# Patient Record
Sex: Female | Born: 1940 | Race: White | Hispanic: No | Smoking: Former smoker
Health system: Southern US, Community
[De-identification: ages and names within clinical notes are randomized; demographics above are authoritative.]

## PROBLEM LIST (undated history)

## (undated) DIAGNOSIS — G35 Multiple sclerosis: Secondary | ICD-10-CM

## (undated) DIAGNOSIS — G4733 Obstructive sleep apnea (adult) (pediatric): Secondary | ICD-10-CM

## (undated) DIAGNOSIS — E785 Hyperlipidemia, unspecified: Secondary | ICD-10-CM

## (undated) DIAGNOSIS — F419 Anxiety disorder, unspecified: Secondary | ICD-10-CM

## (undated) DIAGNOSIS — Z87898 Personal history of other specified conditions: Secondary | ICD-10-CM

## (undated) DIAGNOSIS — I7121 Aneurysm of the ascending aorta, without rupture: Secondary | ICD-10-CM

## (undated) DIAGNOSIS — N319 Neuromuscular dysfunction of bladder, unspecified: Secondary | ICD-10-CM

## (undated) DIAGNOSIS — F32A Depression, unspecified: Secondary | ICD-10-CM

## (undated) DIAGNOSIS — M1990 Unspecified osteoarthritis, unspecified site: Secondary | ICD-10-CM

## (undated) DIAGNOSIS — F17200 Nicotine dependence, unspecified, uncomplicated: Secondary | ICD-10-CM

## (undated) DIAGNOSIS — E039 Hypothyroidism, unspecified: Secondary | ICD-10-CM

## (undated) DIAGNOSIS — G8929 Other chronic pain: Secondary | ICD-10-CM

## (undated) DIAGNOSIS — I639 Cerebral infarction, unspecified: Secondary | ICD-10-CM

## (undated) DIAGNOSIS — M545 Other chronic pain: Secondary | ICD-10-CM

## (undated) DIAGNOSIS — E119 Type 2 diabetes mellitus without complications: Secondary | ICD-10-CM

## (undated) DIAGNOSIS — E669 Obesity, unspecified: Secondary | ICD-10-CM

## (undated) DIAGNOSIS — W540XXA Bitten by dog, initial encounter: Secondary | ICD-10-CM

## (undated) DIAGNOSIS — I10 Essential (primary) hypertension: Secondary | ICD-10-CM

## (undated) DIAGNOSIS — K589 Irritable bowel syndrome without diarrhea: Secondary | ICD-10-CM

## (undated) DIAGNOSIS — I712 Thoracic aortic aneurysm, without rupture: Secondary | ICD-10-CM

## (undated) DIAGNOSIS — R32 Unspecified urinary incontinence: Secondary | ICD-10-CM

## (undated) DIAGNOSIS — F329 Major depressive disorder, single episode, unspecified: Secondary | ICD-10-CM

## (undated) HISTORY — DX: Cerebral infarction, unspecified: I63.9

## (undated) HISTORY — DX: Neuromuscular dysfunction of bladder, unspecified: N31.9

## (undated) HISTORY — DX: Thoracic aortic aneurysm, without rupture: I71.2

## (undated) HISTORY — DX: Multiple sclerosis: G35

## (undated) HISTORY — DX: Aneurysm of the ascending aorta, without rupture: I71.21

## (undated) HISTORY — DX: Other chronic pain: G89.29

## (undated) HISTORY — PX: CATARACT EXTRACTION: SUR2

## (undated) HISTORY — DX: Major depressive disorder, single episode, unspecified: F32.9

## (undated) HISTORY — DX: Essential (primary) hypertension: I10

## (undated) HISTORY — DX: Bitten by dog, initial encounter: W54.0XXA

## (undated) HISTORY — DX: Low back pain: M54.5

## (undated) HISTORY — DX: Nicotine dependence, unspecified, uncomplicated: F17.200

## (undated) HISTORY — PX: CHOLECYSTECTOMY: SHX55

## (undated) HISTORY — PX: BACK SURGERY: SHX140

## (undated) HISTORY — DX: Anxiety disorder, unspecified: F41.9

## (undated) HISTORY — DX: Other chronic pain: M54.50

## (undated) HISTORY — PX: RETINAL DETACHMENT SURGERY: SHX105

## (undated) HISTORY — DX: Obesity, unspecified: E66.9

## (undated) HISTORY — DX: Hypothyroidism, unspecified: E03.9

## (undated) HISTORY — DX: Obstructive sleep apnea (adult) (pediatric): G47.33

## (undated) HISTORY — PX: NASAL SINUS SURGERY: SHX719

## (undated) HISTORY — PX: OTHER SURGICAL HISTORY: SHX169

## (undated) HISTORY — DX: Depression, unspecified: F32.A

## (undated) HISTORY — DX: Hyperlipidemia, unspecified: E78.5

---

## 2004-06-24 HISTORY — PX: LEG SURGERY: SHX1003

## 2004-12-30 ENCOUNTER — Emergency Department (HOSPITAL_COMMUNITY): Admission: EM | Admit: 2004-12-30 | Discharge: 2004-12-30 | Payer: Self-pay | Admitting: Emergency Medicine

## 2005-08-14 ENCOUNTER — Ambulatory Visit (HOSPITAL_COMMUNITY): Admission: RE | Admit: 2005-08-14 | Discharge: 2005-08-14 | Payer: Self-pay | Admitting: Neurological Surgery

## 2006-07-22 ENCOUNTER — Ambulatory Visit (HOSPITAL_BASED_OUTPATIENT_CLINIC_OR_DEPARTMENT_OTHER): Admission: RE | Admit: 2006-07-22 | Discharge: 2006-07-22 | Payer: Self-pay | Admitting: Otolaryngology

## 2006-07-22 ENCOUNTER — Encounter: Payer: Self-pay | Admitting: Internal Medicine

## 2006-07-27 ENCOUNTER — Ambulatory Visit: Payer: Self-pay | Admitting: Internal Medicine

## 2006-10-16 ENCOUNTER — Ambulatory Visit: Payer: Self-pay | Admitting: Internal Medicine

## 2006-11-19 ENCOUNTER — Ambulatory Visit: Payer: Self-pay | Admitting: Internal Medicine

## 2006-12-01 ENCOUNTER — Ambulatory Visit: Payer: Self-pay | Admitting: Internal Medicine

## 2007-04-02 ENCOUNTER — Ambulatory Visit (HOSPITAL_COMMUNITY): Admission: RE | Admit: 2007-04-02 | Discharge: 2007-04-02 | Payer: Self-pay | Admitting: Orthopedic Surgery

## 2007-08-27 ENCOUNTER — Ambulatory Visit (HOSPITAL_BASED_OUTPATIENT_CLINIC_OR_DEPARTMENT_OTHER): Admission: RE | Admit: 2007-08-27 | Discharge: 2007-08-27 | Payer: Self-pay | Admitting: Urology

## 2007-11-30 ENCOUNTER — Encounter: Admission: RE | Admit: 2007-11-30 | Discharge: 2007-11-30 | Payer: Self-pay | Admitting: Neurological Surgery

## 2007-12-14 ENCOUNTER — Inpatient Hospital Stay (HOSPITAL_COMMUNITY): Admission: EM | Admit: 2007-12-14 | Discharge: 2007-12-18 | Payer: Self-pay | Admitting: Emergency Medicine

## 2007-12-16 ENCOUNTER — Ambulatory Visit: Payer: Self-pay | Admitting: Thoracic Surgery (Cardiothoracic Vascular Surgery)

## 2008-03-08 ENCOUNTER — Ambulatory Visit (HOSPITAL_BASED_OUTPATIENT_CLINIC_OR_DEPARTMENT_OTHER): Admission: RE | Admit: 2008-03-08 | Discharge: 2008-03-08 | Payer: Self-pay | Admitting: Urology

## 2008-05-02 ENCOUNTER — Ambulatory Visit: Payer: Self-pay | Admitting: *Deleted

## 2008-05-02 ENCOUNTER — Inpatient Hospital Stay (HOSPITAL_COMMUNITY): Admission: RE | Admit: 2008-05-02 | Discharge: 2008-05-07 | Payer: Self-pay | Admitting: Neurological Surgery

## 2008-05-12 ENCOUNTER — Inpatient Hospital Stay (HOSPITAL_COMMUNITY): Admission: AD | Admit: 2008-05-12 | Discharge: 2008-05-24 | Payer: Self-pay | Admitting: Neurological Surgery

## 2008-05-26 ENCOUNTER — Inpatient Hospital Stay (HOSPITAL_COMMUNITY): Admission: EM | Admit: 2008-05-26 | Discharge: 2008-05-30 | Payer: Self-pay | Admitting: Emergency Medicine

## 2008-09-05 ENCOUNTER — Ambulatory Visit: Payer: Self-pay | Admitting: Vascular Surgery

## 2010-05-02 ENCOUNTER — Encounter: Admission: RE | Admit: 2010-05-02 | Discharge: 2010-05-02 | Payer: Self-pay | Admitting: Neurological Surgery

## 2010-05-25 ENCOUNTER — Ambulatory Visit: Payer: Self-pay | Admitting: Internal Medicine

## 2010-05-25 DIAGNOSIS — G4733 Obstructive sleep apnea (adult) (pediatric): Secondary | ICD-10-CM

## 2010-05-25 DIAGNOSIS — IMO0002 Reserved for concepts with insufficient information to code with codable children: Secondary | ICD-10-CM

## 2010-05-25 DIAGNOSIS — G2581 Restless legs syndrome: Secondary | ICD-10-CM

## 2010-05-25 DIAGNOSIS — G4700 Insomnia, unspecified: Secondary | ICD-10-CM | POA: Insufficient documentation

## 2010-05-25 DIAGNOSIS — J42 Unspecified chronic bronchitis: Secondary | ICD-10-CM

## 2010-05-25 DIAGNOSIS — F17200 Nicotine dependence, unspecified, uncomplicated: Secondary | ICD-10-CM

## 2010-05-29 DIAGNOSIS — E785 Hyperlipidemia, unspecified: Secondary | ICD-10-CM

## 2010-05-29 DIAGNOSIS — I10 Essential (primary) hypertension: Secondary | ICD-10-CM | POA: Insufficient documentation

## 2010-05-29 DIAGNOSIS — J309 Allergic rhinitis, unspecified: Secondary | ICD-10-CM | POA: Insufficient documentation

## 2010-05-30 ENCOUNTER — Ambulatory Visit (HOSPITAL_COMMUNITY)
Admission: RE | Admit: 2010-05-30 | Discharge: 2010-05-30 | Payer: Self-pay | Source: Home / Self Care | Attending: Neurological Surgery | Admitting: Neurological Surgery

## 2010-05-30 LAB — CONVERTED CEMR LAB
BUN: 6 mg/dL (ref 6–23)
Basophils Relative: 1 % (ref 0.0–3.0)
CO2: 29 meq/L (ref 19–32)
Chloride: 100 meq/L (ref 96–112)
Creatinine, Ser: 1 mg/dL (ref 0.4–1.2)
Eosinophils Relative: 1.4 % (ref 0.0–5.0)
Glucose, Bld: 102 mg/dL — ABNORMAL HIGH (ref 70–99)
Hemoglobin: 16.9 g/dL — ABNORMAL HIGH (ref 12.0–15.0)
Lymphocytes Relative: 35.9 % (ref 12.0–46.0)
Neutro Abs: 7.2 10*3/uL (ref 1.4–7.7)
Neutrophils Relative %: 56 % (ref 43.0–77.0)
Potassium: 4.3 meq/L (ref 3.5–5.1)
RBC: 5.4 M/uL — ABNORMAL HIGH (ref 3.87–5.11)
WBC: 12.8 10*3/uL — ABNORMAL HIGH (ref 4.5–10.5)

## 2010-06-06 ENCOUNTER — Ambulatory Visit: Payer: Self-pay | Admitting: Internal Medicine

## 2010-06-06 ENCOUNTER — Telehealth: Payer: Self-pay | Admitting: Internal Medicine

## 2010-06-06 ENCOUNTER — Encounter: Payer: Self-pay | Admitting: Internal Medicine

## 2010-06-08 ENCOUNTER — Telehealth (INDEPENDENT_AMBULATORY_CARE_PROVIDER_SITE_OTHER): Payer: Self-pay | Admitting: *Deleted

## 2010-07-02 ENCOUNTER — Ambulatory Visit
Admission: RE | Admit: 2010-07-02 | Discharge: 2010-07-02 | Payer: Self-pay | Source: Home / Self Care | Attending: Internal Medicine | Admitting: Internal Medicine

## 2010-07-02 DIAGNOSIS — K219 Gastro-esophageal reflux disease without esophagitis: Secondary | ICD-10-CM | POA: Insufficient documentation

## 2010-07-24 NOTE — Assessment & Plan Note (Signed)
Summary: sleep apnea/ mbw   Primary Provider/Referring Provider:  [facility]  CC:  Former pt-sleep apnea..  History of Present Illness: 10/16/06- 1. Obstructive sleep apnea with insomnia. 2. Periodic limb movement with arousal. 3. Bronchitis.  HISTORY:  At last visit, we had set up CPAP titration which indicated a best pressure of 13 CWP. She has not taken her machine in yet to get that pressure adjustment made, and I discussed that with her. She is noticing persistent cough and thick clear sputum, but says that it does not bother her. She is former smoker and she is on Lisinopril which we discussed.   May 25, 2010- OSA. PLMS, bronchitis  70 yoF dx'd 07/22/06 w/ moderate obstructive sleep apnea, AHI 25.2/hr, and periodic limb movement syndrome. She returns now complaining of difficulty initiating and maintaining sleep. Insomnia worse in past year, with no sleep on some nights. Clonazepam 1 mg helped for about 6 months, but is losing effect. Says she uses CPAP every night at 13, but sometimes "aggravating". Bedtime very irregular, with latency just a few minutes once ready for bed. Denies waking after sleep onset. Wake up is variable. Leggs are restelss, blamed on degenerative disk disease for which she rarely takes Vicodin.  Chronic bronchitis with persistent cough, "foam". Seasonal allergic rhinitis worse this year- taking daily Allegra and occasional Nasonex.    Preventive Screening-Counseling & Management  Alcohol-Tobacco     Smoking Status: current     Smoking Cessation Counseling: yes     Packs/Day: 0.5     Year Started: 1981     Tobacco Counseling: to quit use of tobacco products  Current Medications (verified): 1)  Klonopin 1 Mg Tabs (Clonazepam) .... Take 1-2 By Mouth At Bedtime 2)  Bystolic 10 Mg Tabs (Nebivolol Hcl) .... Take 1 By Mouth Once Daily 3)  Synthroid 175 Mcg Tabs (Levothyroxine Sodium) .... Take 1 By Mouth Once Daily 4)  Vicodin 5-500 Mg Tabs  (Hydrocodone-Acetaminophen) .... Take 1 By Mouth Every 4-6 Hours As Needed Pain 5)  Creon 24000 Unit Cpep (Pancrelipase (Lip-Prot-Amyl)) .... Take 1 By Mouth Once Daily  Allergies (verified): 1)  ! Codeine 2)  ! Pcn  Past History:  Family History: Last updated: 05/25/2010 Family hx of cancer(unsure what types) Father- died cancer and emphysema Mother living  Social History: Last updated: 05/25/2010 Widowed, Divorced Current smoker 1/2ppd Rare use of ETOH Retired Engineer, civil (consulting), Advertising account planner  Risk Factors: Smoking Status: current (05/25/2010) Packs/Day: 0.5 (05/25/2010)  Past Medical History: Obstructive sleep apnea- 07/22/06- NPSG AHI 25.2/ hr Chronic bronchitis Tobacco use Allergic rhinitis Degenerative disk disease Hyperlipidemia Hypertension  Past Surgical History: Lumbar disk surgery x 2- Dr Danielle Dess Appendectomy Cholecystectomy Total Abdominal Hysterectomy  Family History: Family hx of cancer(unsure what types) Father- died cancer and emphysema Mother living  Social History: Widowed, Divorced Current smoker 1/2ppd Rare use of ETOH Retired Engineer, civil (consulting), Estate agent Status:  current Packs/Day:  0.5  Vital Signs:  Patient profile:   70 year old female Weight:      249 pounds O2 Sat:      93 % on Room air Pulse rate:   67 / minute BP sitting:   124 / 62  (left arm) Cuff size:   large  Vitals Entered By: Reynaldo Minium CMA (May 25, 2010 2:41 PM)  O2 Flow:  Room air CC: Former pt-sleep apnea.   Physical Exam  Additional Exam:  General: A/Ox3; pleasant and cooperative, NAD, overweight SKIN: no rash, lesions NODES:  no lymphadenopathy HEENT: [place]/AT, EOM- WNL, Conjuctivae- clear, PERRLA, TM-WNL, Nose- clear, Throat- clear and wnl. Mallampati  IV NECK: Supple w/ fair ROM, JVD- none, normal carotid impulses w/o bruits Thyroid- normal to palpation CHEST: Coarse rhonchi bilaterally, unlabored HEART: RRR, no m/g/r heard ABDOMEN: Soft and nl; nml bowel  sounds; no organomegaly or masses noted HYQ:MVHQ, nl pulses, no edema  NEURO: Grossly intact to observation       Impression & Recommendations:  Problem # 1:  INSOMNIA (ICD-780.52) She has developed some tolerance to clonazepam and we will try increasing the dose while adding requip. Back pain and leg movement are contributing. We have reviewed sleep hygiene. I am not sure if her sleep apnea or CPAP are really part of the insomnia complaint now.   Problem # 2:  BRONCHITIS, CHRONIC (ICD-491.9)  Smoking cessation CXR Lab- BNP, IgE profile , PFT Neb depo  Problem # 3:  RESTLESS LEGS SYNDROME (ICD-333.94)  Increase clonazepam to 1.5 mg hs and add Requip  Problem # 4:  ALLERGIC RHINITIS (ICD-477.9) We will explore allergy contribution to her rhinitis and bronchits,while emphasizing that her smoking is important and maybe the dominant problem.  Medications Added to Medication List This Visit: 1)  Klonopin 1 Mg Tabs (Clonazepam) .... Take 1-2 by mouth at bedtime 2)  Bystolic 10 Mg Tabs (Nebivolol hcl) .... Take 1 by mouth once daily 3)  Synthroid 175 Mcg Tabs (Levothyroxine sodium) .... Take 1 by mouth once daily 4)  Vicodin 5-500 Mg Tabs (Hydrocodone-acetaminophen) .... Take 1 by mouth every 4-6 hours as needed pain 5)  Creon 24000 Unit Cpep (Pancrelipase (lip-prot-amyl)) .... Take 1 by mouth once daily 6)  Klonopin 1 Mg Tabs (Clonazepam) .... 1.5 mg ( 1 and a half tabs) at bedtime 7)  Ropinirole Hcl 0.25 Mg Tabs (Ropinirole hcl) .Marland Kitchen.. 1 at bedtime and up to three times a day as needed  Other Orders: New Patient Level IV (46962) TLB-CBC Platelet - w/Differential (85025-CBCD) TLB-BMP (Basic Metabolic Panel-BMET) (80048-METABOL) TLB-BNP (B-Natriuretic Peptide) (83880-BNPR) T-Allergy Profile Region II-[place], [place], [place], [place], [place] (5484) T-2 View CXR (71020TC) Misc. Referral (Misc. Ref)  Patient Instructions: 1)  Please schedule a follow-up appointment in 1 month. 2)  Let's please try to  stop smoking 3)  A chest x-ray has been recommended.  Your imaging study may require preauthorization.  4)  See [facility] to schedule PFT 5)  Try increasing Klonopin to one and a half tabs (1.5 mg) at bedtime. 6)  Add sample/ script ropinerole/ Requip 0.25 mg- 1 at bedtime and also up to 3 times daily if needed for leg jerks 7)  use the CPAP Prescriptions: ROPINIROLE HCL 0.25 MG TABS (ROPINIROLE HCL) 1 at bedtime and up to three times a day as needed  #50 x 3   Entered and Authorized by:   Waymon Budge MD   Signed by:   Waymon Budge MD on 05/25/2010   Method used:   Print then Give to Patient   RxID:   [id number]

## 2010-07-26 NOTE — Progress Notes (Signed)
Summary: returning call  Phone Note Call from Patient Call back at Home Phone [phone]   Caller: Patient Call For: young Summary of Call: Returning call. Initial call taken by: Darletta Moll,  June 08, 2010 11:28 AM  Follow-up for Phone Call        Rx was sent to pharm per last phone note- spoke with pt and notified this was done. Follow-up by: Vernie Murders,  June 08, 2010 1:28 PM    New/Updated Medications: BIAXIN 500 MG TABS (CLARITHROMYCIN) 1 by mouth two times a day until gone Prescriptions: BIAXIN 500 MG TABS (CLARITHROMYCIN) 1 by mouth two times a day until gone  #14 x 0   Entered by:   Vernie Murders   Authorized by:   Waymon Budge MD   Signed by:   Vernie Murders on 06/08/2010   Method used:   Electronically to        [facility]  [address] [phone]* (retail)       [address]       [place], [place]  [address]       Ph: [phone] or [phone]       Fax: [phone]   RxID:   [id number]

## 2010-07-26 NOTE — Assessment & Plan Note (Signed)
Summary: 1 MONTH RETURN/[facility]   Primary Provider/Referring Provider:  [facility]  CC:   1 month follow up, pt not taking ropinrole states she can not afford it , and prod cough yellow.  History of Present Illness: May 25, 2010- OSA. PLMS, bronchitis  70 yoF dx'd 07/22/06 w/ moderate obstructive sleep apnea, AHI 25.2/hr, and periodic limb movement syndrome. She returns now complaining of difficulty initiating and maintaining sleep. Insomnia worse in past year, with no sleep on some nights. Clonazepam 1 mg helped for about 6 months, but is losing effect. Says she uses CPAP every night at 13, but sometimes "aggravating". Bedtime very irregular, with latency just a few minutes once ready for bed. Denies waking after sleep onset. Wake up is variable. Leggs are restelss, blamed on degenerative disk disease for which she rarely takes Vicodin.  Chronic bronchitis with persistent cough, "foam". Seasonal allergic rhinitis worse this year- taking daily Allegra and occasional Nasonex.   July 02, 2010-  OSA. PLMS, bronchitis Nurse-CC:   1 month follow up, pt not taking ropinrole states she can not afford it , prod cough yellow Gave biaxin in December for bronchitis. Itr helped but still coughing productively yellow. This is her baseline over the past year. Denies fever. Nightsweats ? hormonal. Some days cough better than others and may sneeze, butno recent acute illness. Denies reflux recently but has had many times in past where she woke choking and gasping- suggestive of reflux with aspiration. Eval in past by  GI in [place], given pancreatic supplements. She saw no benefit from script antacids.  Continues CPAP every night. Ropinerole too expensive- didn't fill. Takes clonazepam 1-2 a night. Labs-PFT 06/06/10- mild obstruction small airways w/ resp to BD, R 0.80, DLCO 70%          CXR- mild thickening, NAD          CBC- WBC 12,800; Hgb 16.9- polycythemia vs dehydration           Allergy  profile- Neg, IgE < 1,5    Preventive Screening-Counseling & Management  Alcohol-Tobacco     Smoking Status: current     Smoking Cessation Counseling: yes     Packs/Day: 0.5     Year Started: 1981     Tobacco Counseling: to quit use of tobacco products  Current Medications (verified): 1)  Klonopin 1 Mg Tabs (Clonazepam) .... Take 1-2 By Mouth At Bedtime 2)  Bystolic 10 Mg Tabs (Nebivolol Hcl) .... Take 1 By Mouth Once Daily 3)  Synthroid 175 Mcg Tabs (Levothyroxine Sodium) .... Take 1 By Mouth Once Daily 4)  Vicodin 5-500 Mg Tabs (Hydrocodone-Acetaminophen) .... Take 1 By Mouth Every 4-6 Hours As Needed Pain 5)  Creon 24000 Unit Cpep (Pancrelipase (Lip-Prot-Amyl)) .... Take 1 By Mouth Once Daily 6)  Ropinirole Hcl 0.25 Mg Tabs (Ropinirole Hcl) .Marland Kitchen.. 1 At Bedtime and Up To Three Times A Day As Needed 7)  Paxil 20 Mg Tabs (Paroxetine Hcl) .Marland Kitchen.. 1 Two Times A Day 8)  Accupril 20 Mg Tabs (Quinapril Hcl) .Marland Kitchen.. 1 Once Daily 9)  Crestor 10 Mg Tabs (Rosuvastatin Calcium) .Marland Kitchen.. 1 Once Daily  Allergies (verified): 1)  ! Codeine 2)  ! Pcn  Past History:  Past Surgical History: Last updated: 05/25/2010 Lumbar disk surgery x 2- Dr Danielle Dess Appendectomy Cholecystectomy Total Abdominal Hysterectomy  Family History: Last updated: 05/25/2010 Family hx of cancer(unsure what types) Father- died cancer and emphysema Mother living  Social History: Last updated: 05/25/2010 Widowed, Divorced Current  smoker 1/2ppd Rare use of ETOH Retired Engineer, civil (consulting), Advertising account planner  Risk Factors: Smoking Status: current (07/02/2010) Packs/Day: 0.5 (07/02/2010)  Past Medical History: Obstructive sleep apnea- 07/22/06- NPSG AHI 25.2/ hr Chronic bronchitis- PFT 06/06/10- Mild obst small airways w/ resp to BD, R 0.80; DLCO 0.70 Tobacco use Allergic rhinitis Degenerative disk disease Hyperlipidemia Hypertension  Review of Systems      See HPI       The patient complains of shortness of breath with activity  and productive cough.  The patient denies shortness of breath at rest, coughing up blood, chest pain, irregular heartbeats, acid heartburn, indigestion, loss of appetite, weight change, abdominal pain, difficulty swallowing, sore throat, tooth/dental problems, headaches, nasal congestion/difficulty breathing through nose, and sneezing.    Vital Signs:  Patient profile:   70 year old female Height:      63 inches Weight:      246.2 pounds BMI:     43.77 O2 Sat:      90 % on Room air Pulse rate:   92 / minute BP sitting:   124 / 78  (right arm) Cuff size:   large  Vitals Entered By: Renold Genta RCP, LPN (July 02, 2010 11:00 AM)  O2 Flow:  Room air CC:  1 month follow up, pt not taking ropinrole states she can not afford it , prod cough yellow Comments Medications reviewed with patient Renold Genta RCP, LPN  July 02, 2010 11:00 AM    Physical Exam  Additional Exam:  General: A/Ox3; pleasant and cooperative, NAD, overweight SKIN: no rash, lesions NODES: no lymphadenopathy HEENT: [place]/AT, EOM- WNL, Conjuctivae- clear, PERRLA, TM-WNL, Nose- clear, Throat- clear and wnl. Mallampati  IV NECK: Supple w/ fair ROM, JVD- none, normal carotid impulses w/o bruits Thyroid- normal to palpation CHEST: Coarse rhonchi bilaterally, especially on the right, unlabored HEART: RRR, no m/g/r heard ABDOMEN: obese JXB:JYNW, nl pulses, no edema  NEURO: Grossly intact to observation       Impression & Recommendations:  Problem # 1:  BRONCHITIS, CHRONIC (ICD-491.9)  I can't show an obvious allergic process by in vitro testing. She may have caused this condition with repeated aspiration in the past, as well as smoking. Discussed trial of a LABA/ ICS, doxycycline  Problem # 2:  TOBACCO ABUSE (ICD-305.1)  Must stop smoking- try patches again. Support options reviewed.  Problem # 3:  RESTLESS LEGS SYNDROME (ICD-333.94)  Will try samples of Horizant since she is already on  clonazepam.  Problem # 4:  GERD (ICD-530.81)  Reflux precautions  Medications Added to Medication List This Visit: 1)  Paxil 20 Mg Tabs (Paroxetine hcl) .Marland Kitchen.. 1 two times a day 2)  Accupril 20 Mg Tabs (Quinapril hcl) .Marland Kitchen.. 1 once daily 3)  Crestor 10 Mg Tabs (Rosuvastatin calcium) .Marland Kitchen.. 1 once daily 4)  Doxycycline Hyclate 100 Mg Caps (Doxycycline hyclate) .... 2 today then one daily  Other Orders: Est. Patient Level IV (29562)  Patient Instructions: 1)  Please schedule a follow-up appointment in 1 month. 2)  Script doxycycline antibiotic 3)  Sample Dulera 200-5 for bronchitis 4)     2 puffs and rinse mouth well, twice every day 5)  Samples Horizant for leg jerks at night 6)     1 daily with supper  Prescriptions: DOXYCYCLINE HYCLATE 100 MG CAPS (DOXYCYCLINE HYCLATE) 2 today then one daily  #10 x 0   Entered and Authorized by:   Waymon Budge MD   Signed by:  Waymon Budge MD on 07/02/2010   Method used:   Print then Give to Patient   RxID:   [id number]

## 2010-07-26 NOTE — Miscellaneous (Signed)
Summary: Orders Update pft charges  Clinical Lists Changes  Orders: Added new Service order of Carbon Monoxide diffusing w/capacity (94720) - Signed Added new Service order of Lung Volumes (94240) - Signed Added new Service order of Spirometry (Pre & Post) (94060) - Signed 

## 2010-07-26 NOTE — Progress Notes (Signed)
Summary: Sick   Phone Note Call from Patient   Caller: Patient Call For: young Reason for Call: Acute Illness Summary of Call: Pt had PFT today and told Jerolyn Shin that she is having sinus congestion, pain, and pressure. She is having green colored drainage. please advise. Pt is aware of CDY out of the office until the morning. Initial call taken by: Reynaldo Minium CMA,  June 06, 2010 4:42 PM  Follow-up for Phone Call        Suggest offer her biaxin 500 mg , 14, 1 two times a day after meals Follow-up by: Waymon Budge MD,  June 06, 2010 9:34 PM     Appended Document: Sick  LMTCBx1.   Appended Document: Sick  LMTCBx2  Appended Document: Sick  Called, spoke with family memeber.  Was told pt still asleept.  LMTCB

## 2010-08-06 ENCOUNTER — Encounter: Payer: Self-pay | Admitting: Internal Medicine

## 2010-08-06 ENCOUNTER — Ambulatory Visit (INDEPENDENT_AMBULATORY_CARE_PROVIDER_SITE_OTHER): Payer: MEDICARE | Admitting: Internal Medicine

## 2010-08-06 DIAGNOSIS — J42 Unspecified chronic bronchitis: Secondary | ICD-10-CM

## 2010-08-06 DIAGNOSIS — F17200 Nicotine dependence, unspecified, uncomplicated: Secondary | ICD-10-CM

## 2010-08-06 DIAGNOSIS — G4733 Obstructive sleep apnea (adult) (pediatric): Secondary | ICD-10-CM

## 2010-08-15 NOTE — Assessment & Plan Note (Signed)
Summary: 1 month rov/kp   Primary Provider/Referring Provider:  [facility]  CC:  1 month follow up.  Pt states breathing is unchaged.  Prod cough with clear mucus. Some wheezing.  Denies chest tightness.  Still having problems with RLS - no relief with horizent..  History of Present Illness: July 02, 2010-  OSA. PLMS, bronchitis Nurse-CC:   1 month follow up, pt not taking ropinrole states she can not afford it , prod cough yellow Gave biaxin in December for bronchitis. Itr helped but still coughing productively yellow. This is her baseline over the past year. Denies fever. Nightsweats ? hormonal. Some days cough better than others and may sneeze, butno recent acute illness. Denies reflux recently but has had many times in past where she woke choking and gasping- suggestive of reflux with aspiration. Eval in past by  GI in [place], given pancreatic supplements. She saw no benefit from script antacids.  Continues CPAP every night. Ropinerole too expensive- didn't fill. Takes clonazepam 1-2 a night. Labs-PFT 06/06/10- mild obstruction small airways w/ resp to BD, R 0.80, DLCO 70%          CXR- mild thickening, NAD          CBC- WBC 12,800; Hgb 16.9- polycythemia vs dehydration           Allergy profile- Neg, IgE < 1.5  August 06, 2010-  OSA. PLMS, bronchitis, MS Nurse-CC: 1 month follow up.  Pt states breathing is unchanged.  Prod cough with clear mucus. Some wheezing.  Denies chest tightness.  Still having problems with RLS - no relief with horizent. OSA/ Restless Legs/ PLMS- Horizant didn't help. Couldn't afford ropinerole. She now says she thinks the problem is MS, which was a major problem with leg weakness 30 years ago. Her PCP is referring her to Neurology. She has not been iron deficient. Wears CPAP from [facility]- set on "4-5". Coughing spells cause her to take mask off and she gets upset with it. Sleep schedule is variable- poor hygiene and discomfort.  Bronchitis- Continues  to smoke. Note polycythemia. O2 sat 90%. Says she coughs a lot and always congested, but actually feels pretty good. Cough productive clear mucus. Says nothing makes a difference, including neb and depo.    Preventive Screening-Counseling & Management  Alcohol-Tobacco     Smoking Status: current     Smoking Cessation Counseling: yes     Smoke Cessation Stage: precontemplative     Packs/Day: 0.5     Year Started: 1981     Tobacco Counseling: to quit use of tobacco products  Current Medications (verified): 1)  Klonopin 1 Mg Tabs (Clonazepam) .... Take 1-2 By Mouth At Bedtime 2)  Bystolic 10 Mg Tabs (Nebivolol Hcl) .... Take 1 By Mouth Once Daily 3)  Synthroid 175 Mcg Tabs (Levothyroxine Sodium) .... Take 1 By Mouth Once Daily 4)  Vicodin 5-500 Mg Tabs (Hydrocodone-Acetaminophen) .... Take 1 By Mouth Every 4-6 Hours As Needed Pain 5)  Creon 24000 Unit Cpep (Pancrelipase (Lip-Prot-Amyl)) .... Take 1 Tablet By Mouth Three Times A Day 6)  Paxil 20 Mg Tabs (Paroxetine Hcl) .Marland Kitchen.. 1 Tablet in The Am and 2 Tablets in The Evening 7)  Accupril 20 Mg Tabs (Quinapril Hcl) .Marland Kitchen.. 1 Once Daily 8)  Crestor 10 Mg Tabs (Rosuvastatin Calcium) .Marland Kitchen.. 1 Once Daily  Allergies (verified): 1)  ! Codeine 2)  ! Pcn  Past History:  Past Medical History: Last updated: 07/02/2010 Obstructive sleep apnea- 07/22/06- NPSG AHI  25.2/ hr Chronic bronchitis- PFT 06/06/10- Mild obst small airways w/ resp to BD, R 0.80; DLCO 0.70 Tobacco use Allergic rhinitis Degenerative disk disease Hyperlipidemia Hypertension  Past Surgical History: Last updated: 05/25/2010 Lumbar disk surgery x 2- Dr Danielle Dess Appendectomy Cholecystectomy Total Abdominal Hysterectomy  Family History: Last updated: 05/25/2010 Family hx of cancer(unsure what types) Father- died cancer and emphysema Mother living  Social History: Last updated: 05/25/2010 Widowed, Divorced Current smoker 1/2ppd Rare use of ETOH Retired Engineer, civil (consulting), Arts development officer  Review of Systems      See HPI       The patient complains of dyspnea on exertion.  The patient denies anorexia, fever, weight loss, weight gain, vision loss, decreased hearing, hoarseness, chest pain, syncope, peripheral edema, prolonged cough, headaches, hemoptysis, abdominal pain, severe indigestion/heartburn, unusual weight change, abnormal bleeding, enlarged lymph nodes, and angioedema.    Vital Signs:  Patient profile:   70 year old female Height:      64 inches Weight:      248 pounds BMI:     42.72 O2 Sat:      90 % on Room air Pulse rate:   65 / minute BP sitting:   104 / 70  (left arm) Cuff size:   large  Vitals Entered By: Gweneth Dimitri RN (August 06, 2010 1:55 PM)  O2 Flow:  Room air CC: 1 month follow up.  Pt states breathing is unchaged.  Prod cough with clear mucus. Some wheezing.  Denies chest tightness.  Still having problems with RLS - no relief with horizent. Comments Medications reviewed with patient Daytime contact number verified with patient. Gweneth Dimitri RN  August 06, 2010 1:55 PM    Physical Exam  Additional Exam:  General: A/Ox3; pleasant and cooperative, NAD, overweight, ? depressed affect SKIN: no rash, lesions NODES: no lymphadenopathy HEENT: [place]/AT, EOM- WNL, Conjuctivae- clear, PERRLA, TM-WNL, Nose- clear, Throat- clear and wnl. Mallampati  IV NECK: Supple w/ fair ROM, JVD- none, normal carotid impulses w/o bruits Thyroid- normal to palpation CHEST: Coarse rhonchi bilaterally, especially on the right, unlabored. Steady loose cough. HEART: RRR, no m/g/r heard ABDOMEN: obese ZOX:WRUE, nl pulses, no edema, using cane NEURO: Grossly intact to observation       Impression & Recommendations:  Problem # 1:  BRONCHITIS, CHRONIC (ICD-491.9)  Chronic tobacco induced bronchitis. She is not making the effort to quit. I dn't think our meds will do much unless the cigarettes stop. I suspect she is depressed. We can try Spiriva to see if  anticholinergic drying is helpful.   Problem # 2:  TOBACCO ABUSE (ICD-305.1) I continue to press and encourage. She is not yet motivated to make this effort.   Problem # 3:  RESTLESS LEGS SYNDROME (ICD-333.94) I would give Sinemet, but if she is going soon to a neurologist about ? MS, then I will not muddy the water at this time.   Problem # 4:  OBSTRUCTIVE SLEEP APNEA (ICD-327.23)  Compliance with CPAP is limited by her coughing and may not change soon.   Medications Added to Medication List This Visit: 1)  Creon 24000 Unit Cpep (Pancrelipase (lip-prot-amyl)) .... Take 1 tablet by mouth three times a day 2)  Paxil 20 Mg Tabs (Paroxetine hcl) .Marland Kitchen.. 1 tablet in the am and 2 tablets in the evening 3)  Spiriva Handihaler 18 Mcg Caps (Tiotropium bromide monohydrate) .Marland Kitchen.. 1 daily  Other Orders: Est. Patient Level IV (45409)  Patient Instructions: 1)  Please schedule a follow-up appointment  in 3 months. 2)  I will be interested in what your Neurologist has to say. 3)  Cough is affecting your sleep and your ability to wear your CPAP. The best thing you can do to help the cough is to stop smoking.- Please try hard with this.  4)  Sample/ script Spiriva- 1 daily Prescriptions: SPIRIVA HANDIHALER 18 MCG CAPS (TIOTROPIUM BROMIDE MONOHYDRATE) 1 daily  #30 x prn   Entered and Authorized by:   Waymon Budge MD   Signed by:   Waymon Budge MD on 08/06/2010   Method used:   Print then Give to Patient   RxID:   [id number]    Immunization History:  Influenza Immunization History:    Influenza:  historical (05/24/2010)

## 2010-09-17 ENCOUNTER — Other Ambulatory Visit: Payer: Self-pay | Admitting: Neurology

## 2010-09-17 DIAGNOSIS — G373 Acute transverse myelitis in demyelinating disease of central nervous system: Secondary | ICD-10-CM

## 2010-09-18 ENCOUNTER — Ambulatory Visit
Admission: RE | Admit: 2010-09-18 | Discharge: 2010-09-18 | Disposition: A | Discharge status: Home / Self Care | Payer: MEDICARE | Source: Ambulatory Visit | Attending: Neurology | Admitting: Neurology

## 2010-09-18 DIAGNOSIS — G373 Acute transverse myelitis in demyelinating disease of central nervous system: Secondary | ICD-10-CM

## 2010-09-28 ENCOUNTER — Other Ambulatory Visit: Payer: Self-pay | Admitting: Neurology

## 2010-09-28 DIAGNOSIS — G373 Acute transverse myelitis in demyelinating disease of central nervous system: Secondary | ICD-10-CM

## 2010-10-04 ENCOUNTER — Ambulatory Visit
Admission: RE | Admit: 2010-10-04 | Discharge: 2010-10-04 | Disposition: A | Discharge status: Home / Self Care | Payer: MEDICARE | Source: Ambulatory Visit | Attending: Neurology | Admitting: Neurology

## 2010-10-04 DIAGNOSIS — G373 Acute transverse myelitis in demyelinating disease of central nervous system: Secondary | ICD-10-CM

## 2010-10-09 ENCOUNTER — Encounter: Payer: Self-pay | Admitting: Nurse Practitioner

## 2010-10-09 ENCOUNTER — Telehealth: Payer: Self-pay | Admitting: Cardiology

## 2010-10-09 NOTE — Telephone Encounter (Signed)
Heart rate is too low. Says its running in the low fourties. Wants to schedule an appointment. Please call back. I can't find the file.

## 2010-10-09 NOTE — Telephone Encounter (Signed)
Called stating her HR has been in the mid to upper 40's for past several weeks; c/o being tired and SOB. Will see Norma Fredrickson, NP tomorrow.

## 2010-10-10 ENCOUNTER — Encounter: Payer: Self-pay | Admitting: Nurse Practitioner

## 2010-10-10 ENCOUNTER — Ambulatory Visit (INDEPENDENT_AMBULATORY_CARE_PROVIDER_SITE_OTHER): Payer: MEDICARE | Admitting: Nurse Practitioner

## 2010-10-10 ENCOUNTER — Encounter: Payer: Self-pay | Admitting: Cardiology

## 2010-10-10 VITALS — BP 128/86 | HR 64 | Wt 252.0 lb

## 2010-10-10 DIAGNOSIS — R5381 Other malaise: Secondary | ICD-10-CM

## 2010-10-10 DIAGNOSIS — R001 Bradycardia, unspecified: Secondary | ICD-10-CM

## 2010-10-10 DIAGNOSIS — R5383 Other fatigue: Secondary | ICD-10-CM

## 2010-10-10 DIAGNOSIS — E669 Obesity, unspecified: Secondary | ICD-10-CM

## 2010-10-10 DIAGNOSIS — R0609 Other forms of dyspnea: Secondary | ICD-10-CM

## 2010-10-10 DIAGNOSIS — I712 Thoracic aortic aneurysm, without rupture: Secondary | ICD-10-CM | POA: Insufficient documentation

## 2010-10-10 DIAGNOSIS — I10 Essential (primary) hypertension: Secondary | ICD-10-CM

## 2010-10-10 DIAGNOSIS — R69 Illness, unspecified: Secondary | ICD-10-CM

## 2010-10-10 DIAGNOSIS — F17200 Nicotine dependence, unspecified, uncomplicated: Secondary | ICD-10-CM

## 2010-10-10 DIAGNOSIS — I498 Other specified cardiac arrhythmias: Secondary | ICD-10-CM

## 2010-10-10 DIAGNOSIS — R6889 Other general symptoms and signs: Secondary | ICD-10-CM

## 2010-10-10 DIAGNOSIS — R0600 Dyspnea, unspecified: Secondary | ICD-10-CM

## 2010-10-10 NOTE — Assessment & Plan Note (Signed)
She continues to smoke. Cessation is encouraged. She is not ready to quit.

## 2010-10-10 NOTE — Patient Instructions (Signed)
We are going to check some labs today. We are going to update your CT scan to look at your aneurysm. We are going to place a monitor to look at your heart rate for the next 24 hours. Stay on your same medicines for now. I encourage you to stop smoking.

## 2010-10-10 NOTE — Progress Notes (Signed)
[name] [name] Date of Birth: [birth year]   History of Present Illness: [name] [name] is seen today for a work in visit. [name] [name] is seen for Dr. [place]. [name] [name] is here because of a low heart rate. [name] [name] says [name] [name] has had heart rates in the 40's. [name] [name] does not actually check her heart rate. [name] [name] was at the dentist on Monday and they apparently noted a heart rate of 48. [name] [name] has not really felt well over the past 3 months. [name] [name] is more short of breath. Her legs feel heavy. [name] [name] is not having chest pain. [name] [name] is dizzy, but mostly when [name] [name] lies down. [name] [name] has not had syncope. [name] [name] continues to smoke. [name] [name] has not had her thoracic aneurysm relooked at. [name] [name] currently does not have a primary care. [name] [name] is seeing Dr. Sandria Manly for her [name] will check her blood pressure occasionally at the drug store and it has been good.   Current Outpatient Prescriptions on File Prior to Visit  Medication Sig Dispense Refill  . ASPIRIN PO Take by mouth daily.        Marland Kitchen atorvastatin (LIPITOR) 40 MG tablet Take 40 mg by mouth daily.       . Calcium Carbonate-Vitamin D (CALCIUM + D PO) Take by mouth daily.        . clonazePAM (KLONOPIN) 1 MG tablet Take 1 mg by mouth daily.        . Hydrocodone-Acetaminophen (VICODIN PO) Take by mouth as needed.        Marland Kitchen levothyroxine (SYNTHROID, LEVOTHROID) 175 MCG tablet Take 175 mcg by mouth daily.        Marland Kitchen lisinopril (PRINIVIL,ZESTRIL) 20 MG tablet Take 20 mg by mouth daily.        . nebivolol (BYSTOLIC) 10 MG tablet Take 10 mg by mouth daily.        . Pancrelipase, Lip-Prot-Amyl, (CREON) 12000 UNITS CPEP Take 1 capsule by mouth 3 (three) times daily before meals.  270 capsule    . PARoxetine (PAXIL) 20 MG tablet Take 20 mg by mouth 2 (two) times daily.       Marland Kitchen tolterodine (DETROL LA) 4 MG 24 hr capsule Take 4 mg by mouth daily.          Allergies  Allergen Reactions  . Codeine   . Demerol   . Penicillins     Past Medical History  Diagnosis Date  . HTN (hypertension)   . Ascending aortic aneurysm   .  Dyslipidemia   . Tobacco dependence   . Hypothyroidism   . Depression   . Neurogenic bladder   . Obstructive sleep apnea   . Obesity   . Dog bite     RLL with systemic inflammatory response  . Chronic low back pain   . Anxiety     Past Surgical History  Procedure Date  . Cholecystectomy   . Hysterectomy   . Nasal sinus surgery   . Left eye surgery   . Bilateral foot surgery     History  Smoking status  . Current Everyday Smoker  Smokeless tobacco  . Never Used    History  Alcohol Use  . Yes    Rare alcohol use    History reviewed. No pertinent family history.  Review of Systems: The review of systems is positive for fatigue, shortness of breath and dizziness. [name] [name] has chronic joint issues. [name] [name] has chronic wheezing. [name] [name] does not use inhalers. [name] [name] has gained weight since her last visit. [name] [name] attributes that  to steroid therapy. [name] [name] does not exercise.  All other systems were reviewed and are negative.  Physical Exam: BP 128/86  Pulse 64  Wt 252 lb (114.306 kg) Patient is pleasant and in no acute distress. [name] [name] is morbidly obese. Skin is warm and dry. Color is normal.  HEENT is unremarkable. Normocephalic/atraumatic. PERRL. Sclera are nonicteric. Neck is thick and supple. No masses. No JVD. Lungs show diffuse wheezing anteriorly and posteriorly. Cardiac exam shows the heart tones to be distant. Abdomen is soft and obese. Extremities are without significant edema. Gait and ROM are intact. No gross neurologic deficits noted.  LABORATORY DATA:  EKG today shows sinus rhythm. [name] [name] has a RBBB that is chronic. Her rate is 65.   Assessment / Plan:

## 2010-10-10 NOTE — Assessment & Plan Note (Signed)
Her shortness of breath is probably more related to her lungs. We will check a BNP level today. Will check a CXR as well.

## 2010-10-10 NOTE — Assessment & Plan Note (Addendum)
I suspect her fatigue is multifactorial. I think her shortness of breath is related to most definite lung disease. She continues to smoke. She does not practice good health measures. She does have multiple cardiovascular risk factors and may need further testing.  Labs, holter, CXR and CT to follow up her aortic aneurysm are ordered today. Further disposition to follow once her studies are complete. She is agreeable to this plan and will call for any problems in the interim.

## 2010-10-10 NOTE — Assessment & Plan Note (Signed)
Her blood pressure looks ok. We will keep her on her current medicines.

## 2010-10-10 NOTE — Assessment & Plan Note (Signed)
She is not bradycardic here in the office today. She has a chronic RBBB. We will place a holter monitor for the next 24 hours. I have left her medicines alone for now.

## 2010-10-10 NOTE — Assessment & Plan Note (Signed)
We will need to get her CT updated. She has seen Dr. Dorris Fetch in the past.

## 2010-10-11 ENCOUNTER — Telehealth: Payer: Self-pay | Admitting: Internal Medicine

## 2010-10-11 LAB — HEPATIC FUNCTION PANEL
ALT: 22 U/L (ref 0–35)
AST: 29 U/L (ref 0–37)
Albumin: 3.9 g/dL (ref 3.5–5.2)
Alkaline Phosphatase: 57 U/L (ref 39–117)
Bilirubin, Direct: 0.1 mg/dL (ref 0.0–0.3)
Total Bilirubin: 0.4 mg/dL (ref 0.3–1.2)
Total Protein: 7.3 g/dL (ref 6.0–8.3)

## 2010-10-11 LAB — CBC WITH DIFFERENTIAL/PLATELET
Basophils Absolute: 0 10*3/uL (ref 0.0–0.1)
Basophils Relative: 0.3 % (ref 0.0–3.0)
Eosinophils Absolute: 0.3 10*3/uL (ref 0.0–0.7)
Eosinophils Relative: 2.9 % (ref 0.0–5.0)
HCT: 46.6 % — ABNORMAL HIGH (ref 36.0–46.0)
Hemoglobin: 16 g/dL — ABNORMAL HIGH (ref 12.0–15.0)
Lymphocytes Relative: 31.5 % (ref 12.0–46.0)
Lymphs Abs: 3.3 10*3/uL (ref 0.7–4.0)
MCHC: 34.4 g/dL (ref 30.0–36.0)
MCV: 92.1 fl (ref 78.0–100.0)
Monocytes Absolute: 0.3 10*3/uL (ref 0.1–1.0)
Monocytes Relative: 2.9 % — ABNORMAL LOW (ref 3.0–12.0)
Neutro Abs: 6.5 10*3/uL (ref 1.4–7.7)
Neutrophils Relative %: 62.4 % (ref 43.0–77.0)
Platelets: 306 10*3/uL (ref 150.0–400.0)
RBC: 5.06 Mil/uL (ref 3.87–5.11)
RDW: 14.9 % — ABNORMAL HIGH (ref 11.5–14.6)
WBC: 10.4 10*3/uL (ref 4.5–10.5)

## 2010-10-11 LAB — BASIC METABOLIC PANEL
BUN: 11 mg/dL (ref 6–23)
CO2: 27 mEq/L (ref 19–32)
Calcium: 9.9 mg/dL (ref 8.4–10.5)
Chloride: 103 mEq/L (ref 96–112)
Creatinine, Ser: 1 mg/dL (ref 0.4–1.2)
GFR: 67.9 mL/min (ref >60.00)
Glucose, Bld: 106 mg/dL — ABNORMAL HIGH (ref 70–99)
Potassium: 4.6 mEq/L (ref 3.5–5.1)
Sodium: 140 mEq/L (ref 135–145)

## 2010-10-11 LAB — BRAIN NATRIURETIC PEPTIDE: Pro B Natriuretic peptide (BNP): 13.5 pg/mL (ref 0.0–100.0)

## 2010-10-11 LAB — TSH: TSH: 24.32 u[IU]/mL — ABNORMAL HIGH (ref 0.35–5.50)

## 2010-10-11 MED ORDER — TIOTROPIUM BROMIDE MONOHYDRATE 18 MCG IN CAPS: 18.0000 ug | ORAL_CAPSULE | Freq: Every day | RESPIRATORY_TRACT | Status: DC

## 2010-10-11 NOTE — Telephone Encounter (Signed)
I have sent Rx to pharmacy requested per pt. Pt aware.

## 2010-10-12 ENCOUNTER — Telehealth: Payer: Self-pay | Admitting: *Deleted

## 2010-10-12 ENCOUNTER — Ambulatory Visit
Admission: RE | Admit: 2010-10-12 | Discharge: 2010-10-12 | Disposition: A | Discharge status: Home / Self Care | Payer: MEDICARE | Source: Ambulatory Visit | Attending: Nurse Practitioner | Admitting: Nurse Practitioner

## 2010-10-12 DIAGNOSIS — I712 Thoracic aortic aneurysm, without rupture: Secondary | ICD-10-CM

## 2010-10-12 MED ORDER — IOHEXOL 300 MG/ML  SOLN
100.0000 mL | Freq: Once | INTRAMUSCULAR | Status: AC | PRN
  Administered 2010-10-12: 100 mL via INTRAVENOUS

## 2010-10-16 ENCOUNTER — Telehealth: Payer: Self-pay | Admitting: *Deleted

## 2010-10-16 ENCOUNTER — Other Ambulatory Visit: Payer: Self-pay | Admitting: *Deleted

## 2010-10-16 DIAGNOSIS — E039 Hypothyroidism, unspecified: Secondary | ICD-10-CM

## 2010-10-16 MED ORDER — LEVOTHYROXINE SODIUM 200 MCG PO TABS: ORAL_TABLET | ORAL | Status: DC

## 2010-10-16 NOTE — Telephone Encounter (Signed)
Message copied by Murrell Redden on Tue Oct 16, 2010  5:10 PM ------      Message from: [place], PETER      Created: Tue Oct 16, 2010  4:15 PM       The thoracic aorta is mildly dilated and stable. Given her history of tobacco use she should have a noncontrast CT of the chest in 6 months to follow up new nodule.      ----- Message -----         From: Lorayne Bender, RN         Sent: 10/16/2010   4:05 PM           To: Peter [place], MD            Lawson Fiscal sent you her CT angio. Did you see it. Done 4/20

## 2010-10-16 NOTE — Telephone Encounter (Signed)
Lm to call back for lab results; and holter monitor

## 2010-10-16 NOTE — Telephone Encounter (Signed)
Message copied by Murrell Redden on Tue Oct 16, 2010  9:44 AM ------      Message from: Norma Fredrickson      Created: Fri Oct 12, 2010  8:28 AM       Labs are ok except for TSH. The BNP is very low. I suspect her dyspnea is more lung related. She is to have a CXR and CT today.      Is she taking her thyroid medicine? Chart says 175 mcg.      She will need her dose adjusted if she has been taking it regularly. I would increase to 200 mcg per day. She will need a recheck in 6 weeks.

## 2010-10-16 NOTE — Telephone Encounter (Signed)
Notified of CT results. Will recall in 6 mo.for CT non contrast.

## 2010-10-16 NOTE — Telephone Encounter (Signed)
Notified of lab results and monitor; realized CT and CXR had not been reported so will get Dr. [place] to review tomorrow and call her back. She is taking 175 mcg of synthroid so will increase to 200 mg. Sent Rx in. Will recheck in 6 wks. Made appointment.

## 2010-10-16 NOTE — Telephone Encounter (Signed)
Notified of results. Will get CT report and call her tomorrow w/ results.

## 2010-11-06 NOTE — H&P (Signed)
NAME:  [name] [name], [name] [name]                ACCOUNT NO.:  [id number]   MEDICAL RECORD NO.:  [id number]          PATIENT TYPE:  INP   LOCATION:  1311                         FACILITY:  [facility]   PHYSICIAN:  Kela Millin, M.D.DATE OF BIRTH:  1941/05/17   DATE OF ADMISSION:  05/26/2008  DATE OF DISCHARGE:                              HISTORY & PHYSICAL   CONTINUATION   The MRI revealed decreased left paraspinous edema and decreased fluid at  the L4-L5 interspace, and it was noted per radiology that she is status  post anterior fusion, and infection is less likely.  Also, it was noted  that there was increase in subsidence of PEEK spacer into the superior  plate of L5, and that the residual marked central canal stenosis at L4-  L5 that is unchanged.  A chest x-ray was done which was within normal  limits.  The patient's white cell count 12.7 and urinalysis negative for  infection, she is admitted for further evaluation and management.   PAST MEDICAL HISTORY:  1. As above.  2. History of urinary tract infection with Enterococcus species.  3. Hypothyroidism.  4. History of neurogenic bladder.  5. History of urge incontinence.  6. History of obstructive sleep apnea.  7. History of anxiety.  8. History of hyperlipidemia.  9. Hypertension.  10.History of ascending aortic aneurysm, 4 x 4 cm.   MEDICATIONS:  The patient does not have a list of her medications with  her and does not remember the names; discharge medications from June  discharge summary:  1. Detrol LA 4 mg p.o. daily.  2. Lipitor 40 mg p.o. every night.  3. Lisinopril 20 mg p.o. daily.  4. Paxil 20 mg morning and 20 mg q.p.m.  5. Synthroid is 175 mcg daily.   Ms. Dickenson recalls that she has been on OxyContin for pain as well as  morphine, Vicodin and Valium.   ALLERGIES:  CODEINE, PENICILLIN, SULFA AND DEMEROL.   SOCIAL HISTORY:  Positive for tobacco, she denies alcohol.   FAMILY HISTORY:  Noncontributory to current  illness.   PHYSICAL EXAMINATION:  In general, the patient is an elderly white  female; she is alert and oriented, appears to be in moderate distress  secondary to back pain.  VITAL SIGNS:  Temperature is 97.9 with a blood pressure of 130/48 -  initially 161/102 pulse of 83, respiratory rate of 20, O2 saturation of  95%.  HEENT:  PERRL, EOMI, slightly dry mucous membranes, sclerae anicteric  and no oral exudates.  NECK:  Supple, no adenopathy, no thyromegaly and no JVD.  LUNGS:  Clear to auscultation bilaterally.  No crackles or wheezes.  CARDIOVASCULAR:  Regular rate and rhythm.  Normal S1-S2.  ABDOMEN:  Soft, bowel sounds present, nontender, nondistended.  No  organomegaly and no masses palpable.  BACK:  Edematous left paraspinous area in the lumbar region with  tenderness.  Decreased range of motion secondary to pain.  EXTREMITIES:  No edema and no cyanosis.  The strength in her left lower  extremity is 4+/5 and the strength in right lower extremity is 3-4/5.  NEURO:  She is alert and oriented x3.  Cranial nerves II-XII grossly  intact.  Nonfocal exam.   LABORATORY DATA:  MRI and chest x-ray as per HPI.  White cell count is  12.7, hemoglobin 14, hematocrit is 41, platelet count is 636, neutrophil  count 78%.  Sodium is 143 with a potassium of 4.3, chloride 106, CO2 of  29, glucose 124, BUN 11, creatinine 0.81, calcium 9.6.  Urinalysis is  negative for infection.  Also, on the MRI, it is noted at that the  patient could not complete the study due to severe back pain.  Per  radiology report, there was also some increase in subsidence of PEEK  spacer into the superior plate of L5 noted, and that although only  partially visualized, swelling and fluid collection in the left psoas  muscle also appears decreased.  No epidural abscess is identified.   ASSESSMENT AND PLAN:  1. Increased low back pain - patient status post recent back surgeries      per Dr. Danielle Dess.  MRI done and some  increase in subsidence of PEEK      spacer into the superior state of L5 is noted which could be a      cause of pain in this patient.  There is decreased in edema and      fluid reported compared to the patient's last MRI, and radiology      states that these findings and the patient status post anterior      fusion makes infection less likely.  We will continue narcotics for      pain management, consult PT/OT.  We will also consult neurosurgery      for further recommendations.  2. Diarrhea - will obtain stool studies, follow and further manage as      appropriate pending the results.  3. Failure to thrive - likely secondary to above, we will also obtain      a TSH, EKG, LFTs and follow.  Urinalysis as well as chest x-ray are      both negative for infections.  4. Hypothyroidism - as above.  Check TSH.  Continue Synthroid.  5. History of urge incontinence.  6. History of neurogenic bladder.  7. History of hyperlipidemia - continue outpatient medications.  8. History of ascending aortic aneurysm - 4 x 4 cm.  9. Hypertension - continue outpatient medications.      Kela Millin, M.D.  Electronically Signed     ACV/MEDQ  D:  05/27/2008  T:  05/28/2008  Job:  [id number]   cc:   Lavonda Jumbo, M.D.  Fax: [phone]   Stefani Dama, M.D.  Fax: [phone]

## 2010-11-06 NOTE — Op Note (Signed)
NAME:  [name] [name], [name] [name]                ACCOUNT NO.:  [id number]   MEDICAL RECORD NO.:  [id number]          PATIENT TYPE:  INP   LOCATION:  3304                         FACILITY:  [facility]   PHYSICIAN:  Stefani Dama, M.D.  DATE OF BIRTH:  [birth year]   DATE OF PROCEDURE:  05/02/2008  DATE OF DISCHARGE:                               OPERATIVE REPORT   PREOPERATIVE DIAGNOSES:  Spondylolisthesis L4-L5 with lumbar spinal  stenosis, lumbar radiculopathy.   POSTOPERATIVE DIAGNOSES:  Spondylolisthesis L4-L5 with lumbar spinal  stenosis, lumbar radiculopathy.   PROCEDURE:  Anterior lumbar decompression L4-L5 fusion with LDR spacer  allograft, anterior plate fixation Z6-X0.   SURGEON:  Stefani Dama, MD.   APPROACH AND CLOSURE:  Liliane Bade, MD   ANESTHESIA:  General endotracheal.   INDICATIONS:  [name] is a 70 year old individual who has had  significant back and bilateral lower extremity pain.  She has evidence  of an advanced spondylolisthesis with stenosis at the L4-L5 level.  After careful consideration of her option, she was advised regarding  anterior lumbar decompression arthrodesis at the L4-L5 level.   PROCEDURE:  The patient was brought to the operating room, placed on the  table in supine position.  After smooth induction of general  endotracheal anesthesia, she had her abdomen prepped with alcohol and  DuraPrep and draped in a sterile fashion.  Dr. Madilyn Fireman started the  procedure by making a vertical incision on the left side to the lower  portion of the abdomen and exposing the L4-L5 interspace.  I started  back portion of procedure by opening the anterior longitudinal ligament  at L4-L5 and then removing a markedly degenerated disk at the level  using a combination of curettes and rongeurs.  As the region of the  posterior longitudinal ligament was reached, the self-retaining  distractor was placed into the space.  This allowed for decompression of  some osteophytes from  the inferior marginal body of L4 and the superior  marginal body of L5.  This was carried out to lateral recess where the  exiting space for the L4 nerve root was decompressed.  Then the central  canal was similarly well decompressed and a high-speed drill being used  to drill down some of the remnants of the osteophytic overgrowth.  Once  this was completed, hemostasis was obtained from the endplates with some  Gelfoam soaked in thrombin, which was later removed and then the  interspace was spaced with fluoroscopic guidance being used to locate a  30 x 33 mm size spacer, 6 degrees of lordosis, 14 mm in height.  The  device was then filled with allograft and INFUSE and tamped into the  interspace under fluoroscopic guidance.  Small anterior plates were then  placed onto the ventral aspect of  the vertebral bodies for their security.  Once this was completed, final  radiographs identified good position of hardware in the interspace with  good distraction of the height and then the procedure was turned over to  Dr. Madilyn Fireman after removal of the retractors for his final closure.  Blood  loss  for the procedure estimated 200 mL.      Stefani Dama, M.D.  Electronically Signed     HJE/MEDQ  D:  05/02/2008  T:  05/03/2008  Job:  [id number]

## 2010-11-06 NOTE — Discharge Summary (Signed)
NAME:  [name] [name], [name] [name]                ACCOUNT NO.:  [id number]   MEDICAL RECORD NO.:  [id number]          PATIENT TYPE:  INP   LOCATION:  3018                         FACILITY:  [facility]   PHYSICIAN:  Coletta Memos, M.D.     DATE OF BIRTH:  [birth year]   DATE OF ADMISSION:  05/02/2008  DATE OF DISCHARGE:  05/07/2008                               DISCHARGE SUMMARY   ADMITTING DIAGNOSES:  1. Spondylolisthesis L4-5.  2. 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  3. Lumbar radiculopathy.   DISCHARGE DIAGNOSES:  1. Spondylolisthesis L4-5.  2. 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  3. Lumbar radiculopathy.   PROCEDURES:  Anterior lumbar decompression L4-5, arthrodesis with LDR  spacer allograft, anterior plate fixation Z6-X0.   COMPLICATIONS:  None.   SURGEON:  Stefani Dama, MD   DISCHARGE DESTINATION:  Home.   DISCHARGE STATUS:  Alive and well.   INDICATION:  Ms. Bribiesca is doing well after her operation.  She was  given prescriptions for Lovenox, pain, and muscle relaxant.  Wound is  clean and dry.  No signs of infection at discharge.  She is ambulating,  tolerating a regular diet and has voided.  She will have a return  appointment with Dr. Danielle Dess.           ______________________________  Coletta Memos, M.D.     KC/MEDQ  D:  05/07/2008  T:  05/07/2008  Job:  [id number]

## 2010-11-06 NOTE — Discharge Summary (Signed)
NAME:  [name] [name], [name] [name]                ACCOUNT NO.:  [id number]   MEDICAL RECORD NO.:  [id number]          PATIENT TYPE:  INP   LOCATION:  6737                         FACILITY:  [facility]   PHYSICIAN:  Ramiro Harvest, MD    DATE OF BIRTH:  03-Oct-1940   DATE OF ADMISSION:  12/14/2007  DATE OF DISCHARGE:  12/18/2007                               DISCHARGE SUMMARY   ATTENDING PHYSICIAN:  Ramiro Harvest, MD   PRIMARY CARE PHYSICIAN:  Lavonda Jumbo, MD   DISCHARGE DIAGNOSES:  1. Systemic inflammatory response syndrome secondary to his dog bite.  2. Dog bite.  3. Altered mental status secondary to systemic inflammatory response      syndrome.  4. Hypokalemia.  5. Transaminitis.  6. Ascending aortic aneurysm 4 cm x 4 cm.  7. Urinary tract infection with enterococcus species.  8. Hypothyroidism.  9. Depression.  10.Neurogenic bladder.  11.Urge incontinence.  12.Obstructive sleep apnea.  13.Chronic back pain.  14.Anxiety.  15.Hyperlipidemia.  16.Obstructive sleep apnea.   DISCHARGE MEDICATIONS:  1. Augmentin 875 mg p.o. b.i.d. x4 days.  The patient states that she      has tolerated amoxicillin before in the past, although stated she      has a PENICILLIN allergy.  2. Detrol LA 4 mg p.o. daily.  3. Lipitor 40 mg p.o. at bedtime.  4. Lisinopril 20 mg p.o. daily.  5. Paxil 20 mg in the morning, 40 mg at night.  6. Synthroid 175 mcg p.o. daily.  7. Clonazepam 1 mg p.o. at bedtime.  8. Ultrase MT 20 one tablet before meals 3 times daily.   DISPOSITION AND FOLLOWUP:  The patient will be discharged home.  The  patient is to schedule a followup appointment with her PCP on Monday  December 21, 2007 for basic metabolic profile to follow up on the patient's  electrolytes and her potassium.  The patient is also to schedule a  hospital followup in 2 weeks with PCP.  On the hospital followup, a  comprehensive metabolic profile will need to be rechecked to follow up  on the patient's liver  function and electrolytes.  Urinalysis will need  to be rechecked again for resolution of her urinary tract infection.  The patient will also need to follow up with Dr. Dorris Fetch for  followup on an ascending aortic aneurysm.  The patient will likely need  a followup CT in 1 year.  Dr. Sunday Corn office number at [facility] is [phone]  [phone].   CONSULTATIONS:  A [facility] consultation was done on December 16, 2007.  The  patient was seen in consultation by Dr. Dorris Fetch.   PROCEDURES PERFORMED:  1. Chest x-ray was performed on December 14, 2007 that showed cardiac      enlargement, pulmonary vascular congestion, and bibasilar      atelectasis, question minimal left perihilar edema or infiltrate.      CT of the head without contrast was done on December 14, 2007 that      showed chronic small-vessel ischemic change, left maxillary and      sphenoid sinus inflammation.  CT  angiogram of the chest and abdomen      was done on December 14, 2007 that showed no definite intraluminal      hemorrhage within the aorta.  On precontrast images, aneurysmal      dilatation, ascending thoracic aorta 4.2 x 4.1 cm, no evidence of      aortic dissection or pulmonary embolism, minimally scattered      atherosclerotic calcification in aorta, tiny hiatal hernia,      dependent atelectasis of bilateral lungs, no pleural effusions,      segmental consolidations or pneumothorax, no thoracic adenopathy or      focal bony abnormality.  2. 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  Chest      x-ray done on December 15, 2007 showed the PICC line tip in the low      SVC.   BRIEF HOSPITAL HISTORY AND PHYSICAL:  H&P per admitting physician.  It  was noted per HPI that the patient was quite lethargic and little bit  confused and as such a history was spotty, much of the history was   obtained from what the patient could tell the admitting physician as  well as the ER physician who had initially seen the patient at that time  and had also finished his shift, so ER records were used for the HPI.  The patient is a 70 year old white female with past medical history of  hypertension, hypothyroidism, had been on Cipro for presumed UTI,  although not sure about it who had come into the ED and based on the ED  records was complaining of some left shoulder pain which radiated to her  mid back starting the night prior to admission at 8 p.m.  The patient  felt nauseous but had no vomiting.  The patient also has chronic back  pain which apparently was different pain that the patient was  experiencing.  Paramedics were called and the patient was noted to be  somewhat discolored complaining of pain in upper part of her back and  per report, although I could not find any vital signs associated with  this.  There was a report unequal pulses in the arms, again it was not  confirmed anywhere else other than the initial record.  When the patient  came into the emergency room initially, she was noted to have a heart  rate of 100, blood pressure 128/84, and noted to have a respiratory rate  of 32.  CT scan of the head was done which noted somewhat limited  secondary to motion but noted some chronic small-vessel ischemic  changes.  No evidence of any acute head bleed or infarct.  The patient  also noted to have some left maxillary and sphenoid sinus inflammation.  Chest x-ray noted cardiac enlargement, pulmonary vascular congestion,  bibasilar atelectasis, and minimal left perihilar edema versus  infiltrate.  Labs were done on the patient.  The patient was found to  have a white count of 24 with a normal hemoglobin and hematocrit but  with 97% neutrophils.  A UDS was noted only positive for opiates,  although it was noted that the patient received no opiates in the  emergency room and this  was not listed on any of the medications when  she came.  In addition,  the patient was given a dose of Narcan which did  not change her mental status.  Other labs were done.  The patient was  found to be nitrite positive, trace leukocyte esterase, and 15 of  ketones.  The urine microscopy noted 0-2 white cells, some red cells,  and many bacteria.  Rest of her labs were noted for a normal renal  function, normal sodium and potassium with a lactic acid level that came  elevated at 4.4.  The patient told the admitting physician that she just  did not feel well.  When asked about the pain, she complained of pain in  her mid back and had left shoulder pain as, well although her left  shoulder pain seemed to resolve in the ED.  An EKG noted a right bundle  branch block, some sinus tachycardia.  When compared to previous EKG,  tachycardia was the only thing of note, otherwise there were no new  changes.  The patient was somewhat drowsy, unable to give much more for  a detailed review of systems but denied chest pain.  She denied any  shortness of breath, denied any cough.  The patient stated that symptoms  had been ongoing for the past 1-2 days.  Denied any type of abdominal  pain.  The patient stated that she had been urinating okay.  Denied any  change of bowel movements, no constipation or diarrhea, and pretty much  could not state anything else apart from that.   PHYSICAL EXAMINATION:  VITAL SIGNS:  Per admitting physician,  temperature 98.7, pulse of 100 up to 104, blood pressure 128/84 down to  107/62, respirations 32 down to 24, sats 98% on 2 L.  GENERAL:  The patient was drowsy and somnolent but oriented only X2.  HEENT:  Normocephalic, atraumatic.  Unable to get a cranial nerve exam.  Mucous membranes appeared to be dry.  CARDIOVASCULAR:  Tachycardiac, regular rhythm.  No murmurs, rubs, or  gallops.  RESPIRATORY:  Bibasilar crackles.  ABDOMEN:  Soft, obese, nontender, decreased bowel  sounds.  EXTREMITIES:  No clubbing, cyanosis, 1+ edema.  Extremities appeared to  be slightly cool, left more than the right and slightly molted with  decreased capillary refill.   ADMISSION LABORATORY DATA:  White count 23.8, hemoglobin 14.8,  hematocrit 42, MCV of 90, platelet count 246, 97% shift.  UDS was  positive for opiates.  Alcohol level less than 5.  Urine moderate amount  of hemoglobin, small bilirubin, 50 ketones, nitrite positive, trace  leukocyte esterase, many bacteria, 0-2 white cells, and red cells.  I-  STAT showed a sodium of 134, potassium 3.9, chloride 99, bicarb not  done, BUN 15, creatinine 0.8, glucose 125, CPK 216, MB 1.51, troponin-I  less than 0.05, lactic acid level was 4.4.  Thyroid studies had been  obtained.  Blood cultures were ordered.  CMET and stat ABG as well as CT  of the chest, abdomen and pelvis to rule out dissection with results as  stated above.   HOSPITAL COURSE:  1. SIRS.  There was a concern for possible septic shock versus      dissection.  The patient was admitted to the step-down unit.      Serial cardiac enzymes were obtained which came back negative.      Stat CT of the chest, abdomen and pelvis were obtained to rule out      dissection with results as stated above.  The patient received a  dose of Rocephin in the emergency room and before blood cultures      were obtained and urinalysis was also obtained on the patient.  The      patient was placed on IV fluids as well as IV antibiotics.  The      patient was initially placed on vancomycin and Zosyn.  The patient      was noted to have an elevated white count at 25.1.  The patient's      blood pressure was borderline.  The patient's IV fluids were      increased.  The patient's blood pressure responded appropriately to      fluid boluses and increased IV fluids.  A urinalysis was also      obtained.  The patient's Zosyn was discontinued as there was no      improvement in her  white count and her altered mental status.  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  The patient's mental status improved      with this change in antibiotics.  The patient's blood pressure      responded appropriately.  The patient's urine culture was also      pending at the time.  The patient became more lucid and the      patient's mentation returned back to baseline.  The patient's      condition improved on this new set of antibiotics.  Urine cultures      came back with Enterococcus species and the patient was well-      hydrated.  The patient was then transferred from the step-down unit      to the regular floor.  The patient did fine on the regular floor.      The patient's condition continued to improve on a daily basis      throughout the hospitalization and the patient will be discharged      home on Augmentin to finish a 7-day course of Augmentin 875 mg      b.i.d.  The patient had stated that she tolerated amoxicillin in      the past and as such the patient will be discharged home on      Augmentin for 4 additional days to complete a 7-day course of      Augmentin.  The patient will need followup in terms of this with      her PCP.  The patient will be discharged in stable and improved      condition.  2. Altered mental status secondary to SIRS.  The patient's mentation      improved as antibiotics were changed and the patient continued to      respond.  The patient's white count came down and the patient was      back to baseline by day of discharge.  3. Hypokalemia.  The patient came in hypokalemic.  The patient's      potassium was repleted during the hospitalization and magnesium      level was  checked.  During the hospitalization, the patient's      magnesium  level was 1.9.  On the day of discharge, the patient's      potassium was 3.2.  The patient was given 2 doses of K-Dur 40 mEq.      Prior to discharge, the patient will need followup BMET on Monday      December 21, 2007 to follow up on her potassium to see whether further      potassium supplementation is needed.  4. Transaminitis.  It was noted on admission that the patient had an      elevated transaminitis.  The patient had a elevated transaminitis      which was felt to be secondary to shock liver due to decreased      systolic blood pressure.  The patient's liver functions improved      during the hospitalization and prior to discharge.  The patient's      bilirubin was 0.5, alk phosphatase of 60, AST of 37, ALT of 57.      This will need to be followed up as an outpatient.  5. Ascending aortic aneurysm which was noted per CT angio of the      chest.  [facility] was consulted.  The patient remained stable.  The      patient did not have any dissection.  It was recommended that the      patient follow up with Dr. Dorris Fetch at [facility] and will need a      followup CT in the ER to follow up on this ascending aortic      aneurysm.  6. Urinary tract infection.  It was noted on admission that the      patient was nitrite positive.  Urine cultures were obtained that      showed greater than 100,000 enterococcus species which were covered      by antibiotics of vancomycin during the hospitalization.  The      patient will be discharged home on Augmentin to complete a 7-day      course of antibiotics which would cover for the enterococcus      species.  The patient will need a repeat urinalysis on followup for      resolution of a urinary tract infection.  The rest of the patient's      chronic medical issues were stable throughout the hospitalization      and the patient will be discharged in stable and improved      condition.   It  was a pleasure taking care of Ms. [name].      Ramiro Harvest, MD  Electronically Signed     DT/MEDQ  D:  12/18/2007  T:  12/18/2007  Job:  [id number]   cc:   Lavonda Jumbo, M.D.  Salvatore Decent Dorris Fetch, M.D.

## 2010-11-06 NOTE — Op Note (Signed)
NAME:  Wood[place], Joslynn                ACCOUNT NO.:  [id number]   MEDICAL RECORD NO.:  [id number]          PATIENT TYPE:  AMB   LOCATION:  [facility]                         FACILITY:  [facility]   PHYSICIAN:  Martina Sinner, MD DATE OF BIRTH:  1941-04-04   DATE OF PROCEDURE:  03/08/2008  DATE OF DISCHARGE:                               OPERATIVE REPORT   PREOPERATIVE DIAGNOSES:  1. Muscle spasm.  2. Neurogenic bladder.  3. Refractory urge incontinence.   POSTOPERATIVE DIAGNOSES:  1. Muscle spasm.  2. Neurogenic bladder.  3. Refractory urge incontinence.   SURGEON:  Martina Sinner, MD   Ms. Lyndsi Altic did beautifully with her last Botox.  She had some  issues with high residuals so I decreased the dose from 200 units to 150  units.   The ACMI scope was utilized.  Bladder mucosa and trigone were normal.  There was no stitch, foreign body or carcinoma.  She was hydrodistended  to 600 mL.  There were normal coagulations.  With the bladder minimally  full, I injected 150 units of Botox instilled in 15 mL of normal saline  using my usual template at 5 and 7 o'clock and cephalad to the  interureteric ridge.  There was no bleeding.  Bladder was emptied.  The  patient was taken to recovery room.           ______________________________  Martina Sinner, MD  Electronically Signed     SAM/MEDQ  D:  03/08/2008  T:  03/09/2008  Job:  [id number]

## 2010-11-06 NOTE — Consult Note (Signed)
NAME:  Faust, Arora                ACCOUNT NO.:  [id number]   MEDICAL RECORD NO.:  [id number]          PATIENT TYPE:  INP   LOCATION:  2623                         FACILITY:  [facility]   PHYSICIAN:  Salvatore Decent. Dorris Fetch, M.D.DATE OF BIRTH:  May 18, 1941   DATE OF CONSULTATION:  12/16/2007  DATE OF DISCHARGE:                                 CONSULTATION   REASON FOR CONSULTATION:  A 4-cm ascending aortic aneurysm.   HISTORY OF PRESENT ILLNESS:  Ms. Teo is a 70 year old woman with a  history of hypertension who was admitted on December 14, 2007 with altered  mental status, fevers, and chills.  She had suffered a dog bite to her  left ankle several days prior to admission.  She had noted some  subscapular back pain and nausea.  She had taken pain medication which  may have contributed to her altered mental status.   During admission, she was treated with intravenous antibiotics.  She had  a CT of her chest which showed a 4-cm descending thoracic aneurysm.  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   Her past medical history is significant for hypertension,  hypothyroidism, chronic back pain, anxiety, depression, obstructive  sleep apnea, neurogenic bladder incontinence, and smoking.   MEDICATIONS AT THE TIME OF ADMISSION:  Ciprofloxacin, Detrol LA,  Lipitor, lisinopril, Paxil, Synthroid, Klonopin, and Ultram.   ALLERGIES:  DEMEROL, CODEINE, PENICILLIN, and SULFA.   FAMILY HISTORY:  Negative for aneurysm disease.   SOCIAL HISTORY:  She is an active smoker.   REVIEW OF SYSTEMS:  See HPI.  Currently feels well and states that she  wishes to go home.   PHYSICAL EXAMINATION:  GENERAL:  Ms. Drakes is an obese 70-year white  female in no acute distress.  She is afebrile.  VITAL SIGNS:  Blood pressure is 150/100, pulse is 68 and regular,  respirations are 20, and oxygen saturation 98% on room air.  NEUROLOGICAL:  She is alert and oriented x3.  No focal  deficits.  There  are no carotid bruits.  CARDIAC:  Regular rate and rhythm.  Normal S1 and S2.  No rubs or  murmurs.  LUNGS:  Clear.  EXTREMITIES:  She has 2+ carotid, radial pulses.  Faintly palpable  posterior tibial on the left, unable to palpate pulse on the  right due  to pain from the previous dog bite.   LABORATORY DATA:  CT scan reviewed.  Her white count is 12.6 down from  25.6, hematocrit 41, and platelets 162.  BUN and creatinine 6 and 0.7,  albumin 2.9, total bili 0.4, AST is 63, and ALT 69.   IMPRESSION:  Ms. Gibbon is a 70 year old woman with a history of  hypertension who has a 4-cm ascending aortic aneurysm, but she is  asymptomatic.  There is no indication for surgery at this time,  typically in this location 6 cm as  a cut-off, although one would consider 5.5 cm as an indication for  surgery.  She will need to be followed.  I would recommend that she have  a CT scan in 1 year.  My office will arrange followup.  The patient was  instructed to call my office if she has not heard from them in the next  4-5 days.      Salvatore Decent Dorris Fetch, M.D.  Electronically Signed     SCH/MEDQ  D:  12/16/2007  T:  12/17/2007  Job:  [id number]   cc:   Dr. Janee Morn

## 2010-11-06 NOTE — Op Note (Signed)
NAME:  [name] [name], [name] [name]                ACCOUNT NO.:  [id number]   MEDICAL RECORD NO.:  [id number]          PATIENT TYPE:  INP   LOCATION:  3304                         FACILITY:  [facility]   PHYSICIAN:  Balinda Quails, M.D.    DATE OF BIRTH:  April 25, 1941   DATE OF PROCEDURE:  05/02/2008  DATE OF DISCHARGE:                               OPERATIVE REPORT   SURGEON:  Balinda Quails, MD   CO-SURGEON:  Stefani Dama, MD   ASSISTANT:  Jerold Coombe, PA   ANESTHETIC:  General endotracheal.   PREOPERATIVE DIAGNOSIS:  L4-5 degenerative disk disease.   POSTOPERATIVE DIAGNOSIS:  L4-5 degenerative disk disease.   PROCEDURE:  L4-5 anterior lumbar interbody fusion (ALIF).   CLINICAL NOTE:  [name] [name] is a 70 year old obese female with a  history of chronic back pain and evidence of degenerative L4-5 disk  disease.  Scheduled at this time to undergo L4-5 ALIF.  This was carried  out in conjunction with Dr. Danielle Dess.   The patient was evaluated preoperatively in the holding area.  No  history of DVT or pulmonary embolus.  Normal lower extremity pulses.   Details of the operative procedure were reviewed with the patient  preoperatively with the major morbidity mortality 1-2%.  Potential  complications include but are not limited to vessel injury, transfusion,  limb ischemia, DVT, pulmonary embolus, ureter injury, hernia, deep  infection, or other major complication.   OPERATIVE PROCEDURE:  The patient was brought to the operating room in a  stable condition.  Placed under general endotracheal anesthesia.  Foley  catheter and arterial line in place.  Pulse oximetry placed on the left  foot.   Oblique left paramedian skin incision made in the left lower quadrant.  Dissection carried through the subcutaneous tissue with electrocautery.  The left anterior rectus sheath incised.  The left rectus muscle was  mobilized laterally.  The left retroperitoneal space entered bluntly.  The peritoneal  contents rotated anteriorly.  The peritoneum pushed off  the posterior rectus sheath which was incised longitudinally.  The  peritoneal contents further rotated anteriorly, ureter rotated  anteriorly and identified.  The left common and external iliac artery  were skeletonized bluntly pushing the lymphatics laterally.  Small  bridging vessels were controlled with bipolar cautery and divided.  The  L4-5 disk was palpated.  The L4 segmental vessels were clipped and  divided.  Retraction then placed on the left common iliac vein.  The  iliolumbar veins, three of them, were ligated with 2-0 silk and divided.  This allowed full mobilization of the left common iliac vein.  Left  common iliac vein mobilized to the right.  The L4-5 disk was then  cleared bluntly from left-to-right.   The dissection was more difficult is typically encountered due to the  patient's body habitus and size.   Using the Thompson-Brau retractor reverse lip blades were then placed on  L4-5 bodies laterally.  The malleable retractors were placed inferiorly  and superiorly.  This allowed full exposure of the disk at L4-5.   Dr. Danielle Dess then completed  L4-5 ALIF.  At completion of the ALIF, the  retractors were removed.  Pulse oximetry returned to 100% in the left  foot.  Adequate hemostasis obtained.  Sponge and instrument counts were  correct.   The anterior rectus sheath was then closed with a running layer of 0  Vicryl suture.  The deep subcutaneous layer closed with running 2-0  Vicryl suture and superficial subcutaneous layer closed with running 3-0  Vicryl suture.  The skin closed with 4-0 Monocryl.  Dermabond applied.   The patient tolerated the procedure well.  Transferred to recovery room  in stable condition.  No apparent complications.      Balinda Quails, M.D.  Electronically Signed     PGH/MEDQ  D:  05/02/2008  T:  05/03/2008  Job:  [id number]

## 2010-11-06 NOTE — Op Note (Signed)
NAME:  [name] [name], [name] [name]                ACCOUNT NO.:  [id number]   MEDICAL RECORD NO.:  [id number]          PATIENT TYPE:  INP   LOCATION:  3031                         FACILITY:  [facility]   PHYSICIAN:  Stefani Dama, M.D.  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   POSTOPERATIVE DIAGNOSIS:  Bilateral lateral recess stenosis at L4-L5  with lumbar radiculopathy, status post anterior lumbar decompression  arthrodesis at L4-L5.   PROCEDURES:  Bilateral laminotomies and foraminotomies at L4-L5 with  operating microscope, microdissection technique, decompression of L5  nerve roots bilaterally.   SURGEON:  Stefani Dama, MD   ANESTHESIA:  General endotracheal.   INDICATIONS:  [name] [name] is a 70 year old individual who has had  significant back and bilateral lower extremity pain.  She had developed  a spondylolisthesis which was decompressed via an anterior lumbar  decompression and arthrodesis approximately 6-8 weeks ago.  She had  evolvement of right lower extremity pain that has been getting  progressively worse and she has lateral recess stenosis in the exit  foramen.  She is now to undergo bilateral laminotomies and  foraminotomies at the L4-L5 level.   PROCEDURE:  The patient was brought to the operating room and placed  supine on the stretcher.  After smooth induction of general endotracheal  anesthesia, she was turned to prone.  The bony prominences were  appropriately padded and protected.  The back was then prepped with  alcohol and DuraPrep and draped in the sterile fashion.  A midline  incision was created and carried down to the lumbodorsal fascia which  was opened on either side of midline to expose L4-L5.  Interlaminar  spaces were then exposed at L4-L5 after localizing radiograph identified  positively the interlaminar space at L4-L5 and then the laminotomy was  created on the right side first using a high-speed drill and 4-mm round  bit to remove the inferior margin of the lamina out to the medial wall  of facet.  This allowed performance of a medial facetectomy and the  underlying yellow ligament which was thickened.  Redundant was taken off  also.  This exposed the common dural tube and then the lateral recess.  The L5 nerve root was identified.  The foraminotomy was created over the  L5 nerve root using a high-speed drill to thin the bone above the nerve  root as it entered the exit foramen.  The nerve root was noted be taut  and in a very narrow space as it dissected itself laterally.  Dissection  yielded significant improvement in overall path of the nerve without any  pressure on the nerve root itself.  Hemostasis in epidural bleeding  veins was obtained with bipolar cautery and some small pledgets with  Gelfoam soaked in thrombin which were later irrigated away.  This spot  was covered and a similar procedure was carried out  on the left side.  Here, there was noted be a similar amount of lateral recess stenosis  over the L5 nerve root, but the patient complained mostly of right lower  extremity pain.  It was interesting to note that anatomically the nerve  appeared similar on the left side.  Once decompression was obtained and  the area was inspected with use of the operating microscope and  microdissection technique was used during all the portions of the  decompression L5 nerve roots, the microscope was then removed and then  the lumbodorsal fascia was closed with #1 Vicryl in an  interrupted fashion, 2-0 Vicryl was used in the subcutaneous tissues, 3-  0 Vicryl subcuticularly, and a dry sterile dressing was placed on the  skin.  Blood loss for the procedure was estimated about 150 mL.  The   patient tolerated the procedure well and was returned to recovery room  in stable condition.      Stefani Dama, M.D.  Electronically Signed     HJE/MEDQ  D:  05/17/2008  T:  05/18/2008  Job:  [id number]

## 2010-11-06 NOTE — Discharge Summary (Signed)
NAME:  [name] [name], [name] [name]                ACCOUNT NO.:  [id number]   MEDICAL RECORD NO.:  [id number]          PATIENT TYPE:  INP   LOCATION:  1311                         [facility]ACILITY:  [facility]   PHYSICIAN:  Kela Millin, M.D.DATE OF BIRTH:  [birth year]   DATE OF ADMISSION:  05/26/2008  DATE OF DISCHARGE:  05/30/2008                               DISCHARGE SUMMARY   DISCHARGE DIAGNOSES:  1. Low back pain status post back surgeries per Dr. Danielle Dess in November      2009 - MRI with increase in subsidence of PEEK spacer into the      superior plate of L5.  2. Clostridium difficile diarrhea.  3. Hypokalemia - resolved.  4. Hypothyroidism.  5. Hypertension.  6. History of neurogenic bladder.  7. History of a urge incontinence.  8. History of hyperlipidemia.  9. History of ascending aortic aneurysm - 4 x 4 cm.  10.History of anxiety.  11.History of urinary tract infection.  12.History of obstructive sleep apnea.   PROCEDURES AND STUDIES:  1. MRI of the lumbar spine - decreased left paraspinous edema and      decreased fluid at the L4-L5 interspace, status post anterior      fusion, findings make infection less likely.  Some increase in      subsidence of a PEEK spacer into the superior plate of L5 is noted.      Residual marked central canal stenosis L4-L5 is unchanged.   CONSULTATIONS:  Discussed the patient with Dr. Venetia Maxon.   BRIEF HISTORY:  The patient is a pleasant 70 year old white female with  the above-listed medical problems who presented with complaints of  worsening low back pain and diarrhea.  It was noted that she had had two  surgeries done in November by Dr. Danielle Dess and she stated that since her  discharge home she has continued to have back pain and difficulty  walking as a result.  She also was complaining of generalized weakness.  She stated that she had constipation for the most part, but that on the  day of admission and she began having multiple diarrheal stools that  were  nonbloody.  She was admitted for further evaluation and management.   Please see the full dictated admission history and physical for details  of the admission physical exam as well as the laboratory data.   HOSPITAL COURSE:  1. Increased low back pain status post surgery.  Upon admission, the      patient had an MRI of her back done and the results as stated      above.  A urinalysis was also done which was negative for      infection.  The patient was placed on Vicodin for pain management,      she declined taking any further OxyContin as she stated that she      had been on it but developed severe constipation, and so did not      want to take any more of it.  I also consulted neurosurgery as the      patient had had recent surgeries, and  Dr. Venetia Maxon was covering for      Dr. Danielle Dess and following review of the patient's MRI he indicated      that the increased subsidence of the PEEK spacer into the superior      plate of L5 was concerning as well as the persistent central canal      stenosis, but he stated that that would be for Dr. Danielle Dess to see      long-term what the best management options would be, but at this      time he recommended for the patient to keep using her back brace      and for PT/OT to be consulted while the patient was in the      hospital.  Anti-inflammatories were also added for pain management.      Physical therapy was consulted and they saw the patient in the      hospital.  By the second hospital day, her pain was significantly      improved.  She was ambulating in the hallways using her walker and      brace.  Initially she had requested going to a skilled nursing      facility, but given how much better she felt and how well she was      ambulating, she decided that she wanted to go home instead.      Physical therapy followed up with the patient and reevaluated her      and agreed, the recommendation was home with home health physical       therapy/occupational therapy.  The patient has been instructed to      make an appointment with Dr. Danielle Dess discharge to follow up for      further evaluation and management as appropriate given the MRI      findings as above.  2. Clostridium difficile diarrhea.  Upon admission, the patient had      stool samples sent for C diff.  The stool sample was positive for C      diff.  The patient was started on Flagyl as well as Florastor.  Her      diarrhea improved rapidly.  She had a leukocytosis that reached a      peak of 26 while she was in the hospital and this has improved to      12.9 today upon discharge.  She has been afebrile and      hemodynamically stable.  She is tolerating p.o. well and she will      be discharged at this time and is to follow up with her primary      care physician.  She is to continue the oral Flagyl to complete the      recommended antibiotic course.  3. Hypothyroidism.  She was maintained on Synthroid while in the      hospital, a TSH level was checked and it was within normal limits      at 1.893.  4. History of neurogenic bladder.  She is to follow up with her      urologist outpatient as scheduled.  5. Hypertension.  She was maintained on her outpatient medications      during her hospital stay.  6. History of ascending aortic aneurysm.  The patient is to follow-up      up with outpatient physicians.  7. Hypokalemia.  Her potassium was replaced during her hospital stay.  8. History of urge incontinence.  The patient was maintained  on Detrol      during her hospital stay.   DISCHARGE MEDICATIONS:  1. Flagyl 500 mg one p.o. t.i.d. for 10 more days.  2. Florastor 250 mg p.o. b.i.d.  3. Ibuprofen p.r.n. as instructed.  4. The patient to continue her preadmission medications:  Detrol,      Lipitor, Lisinopril, Paxil, Synthroid, Vicodin, Valium.  5. It is noted that she had stopped OxyContin.   FOLLOW-UP CARE:  1. Dr. Danielle Dess, the patient to call for  appointment upon discharge.  2. [name] in 1-2 weeks.  The patient to call for appointment.   DISCHARGE CONDITION:  Improved, stable.      Kela Millin, M.D.  Electronically Signed     ACV/MEDQ  D:  05/30/2008  T:  05/30/2008  Job:  [id number]   cc:   Lavonda Jumbo, M.D.  Fax: [phone]   Stefani Dama, M.D.  Fax: [phone]

## 2010-11-06 NOTE — Op Note (Signed)
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   PROCEDURES:  1. Cheilectomy, right great toe.  2. Gastrocnemius recession, right calf.   SURGEON:  Nadara Mustard, MD   ANESTHESIA:  Local plus MAC.   ESTIMATED BLOOD LOSS:  Minimal.   ANTIBIOTICS:  Clindamycin 600 mg IV.   DRAINS:  None.   COMPLICATIONS:  None.   TOURNIQUET TIME:  None.   DISPOSITION:  To PACU in stable condition.   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  She has good dorsiflexion with the knee  flexed; however, with the knee extended she has dorsiflexion 10 degrees  short of neutral with tightness of the gastrocnemius muscle.  The  patient also has hallux rigidus with dorsiflexion only of 45 degrees of  the great toe.  She has pain with activities of daily living, pain and  calluses beneath the forefoot secondary to the heel cord contracture,  and pain over the great toe secondary to the hallux rigidus.  Due to  failure of conservative care and pain with activities of daily living,  the patient wished to proceed with surgical intervention.  The risks and  benefits were discussed, including infection, neurovascular injury,  persistent pain, and need for additional surgery.  The patient states  she understands and wished to proceed at this time.   DESCRIPTION OF PROCEDURE:  The patient was brought to OR room 15 and the  right lower  extremity was prepped using DuraPrep and draped into a  sterile field.  Using 1% lidocaine plain, approximately 20 mL of 1%  lidocaine plain were used to anesthetize the areas of the gastroc  recession and the cheilectomy.  Attention was first focused on the great  toe.  An incision was made over the medial border of the great toe.  This was carried down through the retinaculum, which was elevated.  A  cheilectomy was performed.  The patient's dorsiflexion went from  approximately 45 degrees of dorsiflexion to almost 70 degrees.  The  patient had good improvement in the range of motion.  The wound was  irrigated with normal saline.  The retinaculum was closed using 2-0  Vicryl and the skin was closed using 2-0 nylon.  Attention was then  focused on the gastrocnemius fascia.  An incision was made 15 cm  proximal to the medial malleolus on the medial posterior aspect of the  calf.  Blunt dissection was carried down to the gastrocnemius fascia and  scissors were  used to release the gastrocnemius fascia.  This took the  patient's dorsiflexion from 10 degrees short of neutral with the knee  extended to about 40 degrees of dorsiflexion.  The patient had good  improvement in her range of motion.  The wounds were irrigated with  normal saline prior to closure and the skin incision was closed with 2-0  nylon using a far-near, near-far suture.  The wounds were covered with  Adaptic orthopedic sponges, sterile Webril, Kerlix and a Coban dressing.  The patient was taken to the PACU in stable condition.  The patient  wished for discharge to home.  She was given a prescription for Vicodin  for pain, postoperative shoe, ice, elevation to the right foot,  weightbearing as tolerated.  Follow-up in the office in 2 weeks.      Nadara Mustard, MD  Electronically Signed     MVD/MEDQ  D:  04/02/2007  T:  04/02/2007  Job:  [id number]

## 2010-11-06 NOTE — Assessment & Plan Note (Signed)
[facility]                             PULMONARY OFFICE NOTE   [name] [name], [name] [name]                         MRN:          [id number]  DATE:12/01/2006                            DOB:          05/09/1941    PROBLEMS:  1. Obstructive sleep apnea with insomnia.  2. Periodic limb movement with arousal.  3. Bronchitis.   HISTORY:  At last visit, we had set up CPAP titration which indicated a  best pressure of 13 CWP. She has not taken her machine in yet to get  that pressure adjustment made, and I discussed that with her. She is  noticing persistent cough and thick clear sputum, but says that it does  not bother her. She is former smoker and she is on Lisinopril which we  discussed.   MEDICATIONS:  1. Lisinopril.  2. Detrol.  3. Synthroid.  4. Kutase.  5. Lipitor.  6. Paxil.  7. Klonopin 0.5 mg.  8. Zanaflex.  9. CPAP at 13 CWP (pending).   Drug intolerant PENICILLIN, SULFA, and CODEINE.   OBJECTIVE:  Weight 244 pounds, blood pressure 144/88, pulse 89, room air  saturation 94%. She is obese. Chest is quiet and clear. I do not hear  any suggestion of cough or congestion. Heart sounds are regular without  murmur. There is no evident nasal congestion or post-nasal drip. No  strider or neck vein distension.   IMPRESSION:  1. Obstructive sleep apnea. We need to go ahead and get her machine      reset for a pressure of 13.  2. Exogenous obesity.  3. Bronchitis (?), possibly related to irritant air quality.   PLAN:  [facility] is given a new note to change her CPAP to 13  CWP. We are scheduling return in four months, earlier p.r.n.     Clinton D. Maple Hudson, MD, Tonny Bollman, FACP  Electronically Signed    CDY/MedQ  DD: 12/07/2006  DT: 12/08/2006  Job #: [id number]   cc:   Onalee Hua L. Annalee Genta, M.D.

## 2010-11-06 NOTE — H&P (Signed)
NAME:  [name] [name], [name] [name]                ACCOUNT NO.:  [id number]   MEDICAL RECORD NO.:  [id number]          PATIENT TYPE:  INP   LOCATION:  1311                         FACILITY:  [facility]   PHYSICIAN:  Kela Millin, M.D.DATE OF BIRTH:  09/08/1940   DATE OF ADMISSION:  05/26/2008  DATE OF DISCHARGE:                              HISTORY & PHYSICAL   PRIMARY CARE PHYSICIAN:  [name].   CHIEF COMPLAINT:  Worsening low back pain and diarrhea.   HISTORY OF PRESENT ILLNESS:  The patient is a 70 year old white female  with past medical history significant for a L4-L5 spondylolisthesis,  lumbar stenosis, lumbar radiculopathy and status post recent surgeries -  anterior lumbar decompression L4-L5, arthrodesis with LDR spacer  allograft, anterior plate fixation Z6-X0 per Dr. Danielle Dess on May 04, 2008, and subsequently on May 17, 2008, she was taken back to  surgery to by Dr. Danielle Dess for bilateral laminotomies and foraminotomies  at L4-L5, decompression of L5 nerve roots bilaterally, who presents with  the above complaints.  She states that since she was discharged home she  has continued to have back pain and difficulty walking as a result.  She  also reports generalized weakness which has gradually worsened, and she  states that she is unable to take care of herself at home.  She  describes the back pain as mostly in her right lower back with radiation  to her right hip, sharp and 10 out of 10 in intensity at its worst.  She  has been on pain medications at home and states that they cause her  severe constipation for the most part.  Beginning this morning, she  began having multiple diarrheal stools - nonbloody - to the point where  she was incontinent of stool and just not able to clean up after  herself.  She also has a history of neurogenic bladder and urge  incontinence and already had problems with continence of urine.  She  denies fevers, cough, chest pain, shortness of  breath, melena, abdominal  pain, nausea, vomiting, and hematochezia.   She was seen in the ER, and an MRI was done of her head was done.   Dictation ended at this point.      Kela Millin, M.D.  Electronically Signed     ACV/MEDQ  D:  05/27/2008  T:  05/28/2008  Job:  [id number]

## 2010-11-06 NOTE — H&P (Signed)
NAME:  [name] [name]                ACCOUNT NO.:  [id number]   MEDICAL RECORD NO.:  [id number]          PATIENT TYPE:  INP   LOCATION:  1833                         FACILITY:  [facility]   PHYSICIAN:  Hollice Espy, M.D.DATE OF BIRTH:  August 21, 1940   DATE OF ADMISSION:  12/14/2007  DATE OF DISCHARGE:                              HISTORY & PHYSICAL   PRIMARY CARE PHYSICIAN:  [name]   CHIEF COMPLAINT:  Left shoulder and mid-back pain.   HISTORY OF PRESENT ILLNESS:  Please note that the patient is quite  lethargic and a bit confused so I am able to get only a spotty history.  Much of my history is obtained from what the patient can tell me as well  as the ER records, as the ER physician who initially saw the patient has  since finished his shift.   HISTORY OF PRESENT ILLNESS:  From what I can tell, the patient is a 70-  year-old white female with a past medical history of hypertension,  hypothyroidism, and apparently has been on Cipro for a presumed UTI  although I am not even sure of this, who came into the emergency room  based on the ER records complaining of some left shoulder pain which  radiated to her mid back starting last night at 8 p.m.  She has felt  nauseous but has had no vomiting.  Apparently, she has chronic back  pain.  Apparently, this is reportedly different.  Paramedics were called  and the patient was noted be somewhat discolored, complaining of pain in  her upper part of her back.  There is a report, although I cannot find  any vital signs to associated with this, but there is report of unequal  pulses in the arms.  Again, this is not confirmed anywhere else other  than the initial record.  When the patient came into the emergency room  initially she was noted to have a heart rate of 100 and a blood pressure  of 128/84.  She was noted to have a respiratory rate of 32.  A CT scan  of the head was done which noted somewhat limited secondary to motion,  but  noted some chronic small-vessel ischemic changes, no evidence of any  acute head bleed or infarct.  She is also noted to have some left  maxillary and sphenoid sinus inflammation.  Chest x-ray noted cardiac  enlargement, pulmonary vascular congestion, bibasilar atelectasis, and a  minimal left perihilar edema versus infiltrate.  Labs were done on the  patient and she was found to have a white count of 24 with a normal  hemoglobin and hematocrit, but with 97% neutrophils.  A urine drug  screen was noted only positive for opiates, although please note that  the patient received no opiates in the emergency room and these are not  listed on any of the medicines that she came in with.  In addition, the  patient was then given a dose of Narcan which has not changed her mental  status state.  Other labs done:  She was found to  have a nitrite  positive, trace leukocyte esterase and 15 of ketones, but a urine  microscopy notes 0-2 white and red cells but many bacteria.  The rest of  her labs are noted for a normal renal function, normal sodium and  potassium, but with a lactic acid level has come back elevated at 4.4.  The patient herself tells me that she does not feel well.  When asked  about pain she complains of pain in her mid-back and earlier she said  left shoulder, although that does not appear to be hurting now.  An EKG  done notes a right bundle-branch block and some sinus tachycardia.  When  compared to a previous EKG, the tachycardia is the only thing of note.  Otherwise, there appears to be no new changes.  The patient is somewhat  drowsy and unable to give me much more detailed review systems but she  denies any chest pain.  She denies any shortness of breath.  She denies  any cough.  She says the symptoms have been going on for the last 1-2  days.  She denies any type of abdominal pain.  She says that she has  been urinating fine.  She denies any change in her bowel movements, no   constipation or diarrhea, and she cannot tell me much more than this.   PAST MEDICAL HISTORY:  Based on looking at her medicines as well as a  few reports on E-chart, she is noted to have anxiety, hypothyroidism,  depression, hypertension, hyperlipidemia obstructive sleep apnea,  history of neurogenic bladder, history of refractory urge incontinence  and possibly a recent UTI for Cipro.   MEDICATIONS:  Based on the pill bottles that are with her:  1. Cipro 250 p.o. b.i.d.  2. Detrol LA 4 mg p.o. daily.  3. Lipitor 40 p.o. q.h.s.  4. Lisinopril 20 p.o. daily.  5. Paxil 20 mg in the morning, 40 mg at night.  6. Synthroid 175 mcg p.o. daily.  7. Clonazepam one p.o. q.h.s.  8. Ultram p.r.n.   She has an allergy to PENICILLIN, CODEINE and DEMEROL.   SOCIAL HISTORY:  No alcohol or drug use.  She says that she is not  suicidal, not trying to hurt herself.  She does smoke about a pack a  day.   FAMILY HISTORY:  Is noncontributory.   PHYSICAL EXAMINATION:  Temperature 98.7; heart rate 100, now up to 104;  blood pressure 128/84, now down to 107/62;  respirations 32, now down to  24; O2 saturation noted to be 98% on 2 L.  GENERAL:  She is drowsy, somnolent.  She is oriented only x2.  HEENT:  Normocephalic, atraumatic.  I am unable to get a cranial nerve  exam but mucous membranes appear to be dry.  HEART:  Is a regular rhythm, mild tachycardia.  LUNGS:  Some bibasilar crackles.  ABDOMEN:  Soft, obese, nontender with decreased bowel sounds.  EXTREMITY:  Show no clubbing, cyanosis, but 1+ edema.  Her extremities  appear to be slightly cool, left more so than right, and slightly  mottled with decreased capillary refill time.   White count is 23.8, H&H 14.8 and 42, MCV of 90, platelet count 246, 97%  shift.  Urine drug screen positive for opiates, although none have been  given and none are listed on her medications.  Alcohol level less than  5.  Urine is moderate amount of hemoglobin,  small bilirubin, 50 of  ketones, nitrite positive, trace  leukocyte esterase, with many bacteria  but only 0-2 white and red cells.  Her i-STAT labs show sodium 134,  potassium 3.9, chloride 99, bicarb is not done, BUN 15, creatinine 0.8,  glucose 125.  CPK 216, MB 1.51.  Troponin I less than 0.05.  Lactic acid  level is 4.4.  her thyroid tests have been ordered and pending.  I have  ordered blood cultures, a comprehensive metabolic panel, and a stat ABG  as well as a CT of the chest, abdomen and pelvis with contrast angiogram  to rule out dissection.   ASSESSMENT AND PLAN:  1. Leukocytosis.  2. Lactic acid.  3. Altered mental status.   This is all concerning for possible septic shock versus less possibly  dissection, versus less possibly a cardiac and drug related.  Will plan  to put the patient in ICU, check serial enzymes, blood culture, STAT  body CT, ABG, and continue to follow closely.  Given the fact that she  has already received a dose of Rocephin in the emergency room, her blood  cultures may have limited value, and in addition, the fact that she has  been on outpatient Cipro may be the  reason why she is not having more grossly positive urinalysis.  Will  plan to keep in the emergency room until her CT has been ruled out for  possibilities of dissection.  It is certainly concerning with the  possibility of back pain.   Time spent on this patient has been 75 minutes.      Hollice Espy, M.D.  Electronically Signed     SKK/MEDQ  D:  12/14/2007  T:  12/14/2007  Job:  [id number]   cc:   Lavonda Jumbo, M.D.

## 2010-11-06 NOTE — Op Note (Signed)
NAME:  [name], [name]                ACCOUNT NO.:  [id number]   MEDICAL RECORD NO.:  [id number]          PATIENT TYPE:  AMB   LOCATION:  [facility]                         FACILITY:  [facility]   PHYSICIAN:  Martina Sinner, MD DATE OF BIRTH:  1941/04/03   DATE OF PROCEDURE:  08/27/2007  DATE OF DISCHARGE:                               OPERATIVE REPORT   PREOPERATIVE DIAGNOSIS:  Muscle spasm, neurogenic bladder, refractory  urge incontinence.   POSTOPERATIVE DIAGNOSIS:  Muscle spasm, neurogenic bladder, refractory  urge incontinence.   SURGERY:  Cystoscopy, bladder hydrodistention, injection of Botox.   [name] has refractory urge incontinence.  She has tried multiple  medications.  She is currently on Detrol.   The patient is prepped and draped in usual fashion.  The ACMI scope was  used for the treatment and examination.  On initial inspection of the  bladder, her bladder mucosa was normal.  She had grade 2 out of 4  bladder trabeculation which was a bit out of the ordinary.  She had a  few floaters that were white in the urine and the urine may have been a  little bit cloudy.  I did send a urine for culture.  In addition to  ciprofloxacin,  I therefore gave her some gentamicin.  She is ALLERGIC  TO PENICILLIN.   She was hydrodistended to 600 mL.  There are no findings in keeping with  the diagnosis of interstitial cystitis.   Using the ACMI scope.  I injected 200 units of Botox with 20 mL of  normal saline throughout 20 sites in the lower half of the bladder,  sparing the trigone.  I injected six or seven times at 7 and 5 o'clock  and cephalad to the interureteric ridge.  There is little to no  bleeding.  The bladder was emptied.  The patient was sent to recovery  room.  The patient will be followed post procedure and I will keep her  on ciprofloxacin.           ______________________________  Martina Sinner, MD  Electronically Signed     SAM/MEDQ  D:  08/27/2007  T:   08/27/2007  Job:  [id number]

## 2010-11-09 NOTE — Procedures (Signed)
NAME:  [name] [name], [name] [name]                ACCOUNT NO.:  [id number]   MEDICAL RECORD NO.:  [id number]          PATIENT TYPE:  OUT   LOCATION:  SLEEP CENTER                 FACILITY:  [facility]   PHYSICIAN:  Clinton D. Maple Hudson, MD, FCCP, FACPDATE OF BIRTH:  12-Mar-1941   DATE OF STUDY:  07/22/2006                            NOCTURNAL POLYSOMNOGRAM   INDICATION FOR STUDY:  Hypersomnia with sleep apnea.   EPWORTH SLEEPINESS SCORE:  21/24, weight 240 pounds, height 5 feet, 4  inches, neck size 17-1/2 inches.   SLEEP ARCHITECTURE:  Total sleep time 333 minutes with sleep efficiency  79%.  Stage I was 10%, stage II 78%, stages 3 and 4 12%, REM absent.  Sleep latency 22 minutes, awake after sleep onset, 70 minutes arousal  index increased at 53.1 indicating increased sleep fragmentation.  Sleep  onset initially at 10:37 p.m. but sustained sleep not achieved until  after midnight.   BEDTIME MEDICATIONS:  1. Kutrase.  2. Paxil.  3. Prevacid.  4. MSM.  5. Enablex.   RESPIRATORY DATA:  Apnea/hypopnea index (AHI, RDI,) 25.2 obstructive  events per hour indicating moderate obstructive sleep apnea/hypopnea  syndrome.  There were 95 obstructive apneas and 45 hypopneas.  Events  were strongly positional, primary related to supine sleep position.  The  patient slept on her right side through most of the study.  Supine sleep  position and significant numbers of obstructive events were not noted  until after 4:00 a.m.  There were insufficient early events to permit  use of CPAP titration by split protocol.   OXYGEN DATA:  Moderately loud snoring with oxygen desaturation to a  nadir of 77%.  Mean oxygen saturation through the study 93% on room air.   CARDIAC DATA:  Sinus rhythm with occasional PVC.   MOVEMENT-PARASOMNIA:  Large numbers of limb jerks with a total of 787  recorded, of which 125 were associated with arousal or awakening for  periodic limb movement with arousal index of 22.5, which is  significantly abnormal.  This may represent the primary sleep disorder.   IMPRESSIONS-RECOMMENDATIONS:  1. Markedly fragmented sleep with absent REM.  2. Moderate obstructive sleep apnea/hypopnea syndrome, AHI 25.2 per      hour, with strongly positional events recorded mainly while      sleeping supine.  Supine sleep position was only sustained after      4:00 a.m.  Moderate snoring with oxygen desaturation to a nadir of      77%.  3. Encouragement to sleep while flat of back, may be therapeutically      successful for management of the sleep apnea component.  Otherwise,      consider return for CPAP titration or other intervention as      appropriate.  4. Significant periodic limb movement with arousal, 22.5 per hour.      Consider specific therapy such as Requip or Mirapex if appropriate.      Clinton D. Maple Hudson, MD, [facility], FACP  Diplomate, Biomedical engineer of Sleep Medicine  Electronically Signed     CDY/MEDQ  D:  07/27/2006 10:51:07  T:  07/27/2006 04:54:09  Job:  [id number]

## 2010-11-09 NOTE — Assessment & Plan Note (Signed)
[facility]                             PULMONARY OFFICE NOTE   MANASVINI, [name] [name]                         MRN:          [id number]  DATE:10/16/2006                            DOB:          [birth year]    SLEEP MEDICINE CONSULTATION.   PROBLEM:  Sleep Medicine consultation at the kind request of Annalee Genta  for this 70 year old woman with obstructive sleep apnea.  She reports  that she stops breathing lots of times and has been told that her  snoring is very loud, nobody sleeps near her because of this.  A sleep  study at the [facility] on July 22, 2006, recorded an index of 25.2  per hour, events were positional, particularly associated with supine  sleep position.  Snoring was moderate with desaturation to 77%.  She  also had periodic limb movement with 22.5 arousals per hour which is  significant.  She had had a sleep study 4 or 5 years ago in [place], at  which time she had had a septoplasty.  Since then she gained 20 pounds.  She does notice daytime sleepiness.  She thinks her snoring wakes  herself.  She had tried CPAP in [place] but said she would pull it off  repeatedly in her sleep and it was difficult because she had bladder  problems and frequent nocturia so she was having to put it on and off  repeatedly during the night.  Bedtime now is anywhere between 12:30 and  2 a.m., estimating 5 minute sleep latency and waking 3-4 times during  the night, and up in the morning between 7 and 9 a.m.   MEDICATION:  1. Lisinopril.  2. Detrol.  3. Synthroid.  4. Kutase.  5. Lipitor.  6. Paxil.  7. Klonopin 0.5 mg.  8. Zanaflex.   DRUG INTOLERANT:  1. PENICILLIN.  2. SULFA.  3. CODEINE.   REVIEW OF SYSTEMS:  Muscle jerks, snoring, witnessed apneas, daytime  sleepiness, choking or gasping that awakes her from sleep,  nonrestorative sleep, daytime naps, indigestion, nocturnal cough, sleep  talking, bruxism.   PAST HISTORY:  1. Hypertension.  2. Elevated cholesterol.  3. Nasal congestion.  4. Skin test positive.  A trial of allergy vaccine made her feel bad      years ago.  5. Chronic headaches.  6. Sleep apnea.  7. No history of heart or lung disease.  8. Hypothyroidism is treated.   SURGERIES:  1. Gallbladder.  2. Septoplasty.  3. Hysterectomy.  4. Appendectomy.  5. Surgical repair of broken feet in March of 2005.  6. She has not had tonsil or adenoid surgery.   SOCIAL HISTORY:  She smoked 1 pack per day for 25 years, quitting 1 year  ago, rare alcohol.  She is widowed with no children.   FAMILY HISTORY:  1. Cancer.  2. Nobody known to have any sleep problems.   OBJECTIVE:  Weight 242 pounds, BP 142/98, pulse regular 87, room air  saturation 94%.  She is obese, rather reserved affect/passive but  responsive to questions.  Nasal airway not obstructed.  Palate spacing 3-  4/4.  Voice quality normal with no thyromegaly or stridor.  CHEST:  Clear.  HEART SOUNDS:  Regular without murmur.  EXTREMITIES:  Without tremor or restlessness.   IMPRESSION:  1. Moderate obstructive sleep apnea 25.2 per hour status post      septoplasty and an unsuccessful remote trial of continuous positive      airway pressure.  2. Difficulty maintaining sleep.  She has had clonazepam which she      tends to take in the evening.  I have suggested that she take it      closer to bedtime.  3. Periodic limb movement with arousal 22.5 per hour, likely to be      significant.  4. Uncertain daytime pulmonary status.   PLAN:  1. We are going to titrate her for another try of CPAP.  Hopefully the      clonazepam taken close to bedtime will make it easier for her to      ignore and adjust to CPAP.  We have discussed the medical issues of      sleep apnea and available therapies, comfort measures and      expectations related to CPAP use.  2. We are scheduling PFT and chest x-ray.  3. She will return in a month for followup.  At that time will  look at      whether we also need to add therapy for restless legs.   I appreciate the chance to meet her.     Clinton D. Maple Hudson, MD, [facility], FACP  Electronically Signed    CDY/MedQ  DD: 10/19/2006  DT: 10/19/2006  Job #: [id number]   cc:   Onalee Hua L. Annalee Genta, M.D.  Lavonda Jumbo, M.D.

## 2010-11-09 NOTE — Discharge Summary (Signed)
NAME:  [name] [name], [name] [name]                ACCOUNT NO.:  [id number]   MEDICAL RECORD NO.:  [id number]          PATIENT TYPE:  INP   LOCATION:  3031                         FACILITY:  [facility]   PHYSICIAN:  Stefani Dama, M.D.  DATE OF BIRTH:  Dec 09, 1940   DATE OF ADMISSION:  05/12/2008  DATE OF DISCHARGE:  05/24/2008                               DISCHARGE SUMMARY   ADMITTING DIAGNOSIS:  Status post anterior lumbar interbody fusion and  decompression at L4-5 with postoperative intractable right lower  extremity pain.   SECONDARY DIAGNOSES:  1. Hypertension.  2. Urinary incontinence.  3. Thyroid disease.  4. Irritable bowel syndrome.   DISCHARGE DIAGNOSES:  1. Status post anterior lumbar interbody fusion and decompression at      L4-5 with postoperative intractable right lower extremity pain.  2. Hypertension.  3. Urinary incontinence.  4. Thyroid disease.  5. Irritable bowel syndrome.   OPERATIONS AND PROCEDURES:  Bilateral laminotomies and foraminotomies at  L4-5 microscopically on May 17, 2008, by Dr. Danielle Dess.   HOSPITAL COURSE:  The patient was admitted on May 12, 2008, with  intractable pain, underwent CT scanning of her lumbar spine to evaluate  for her radicular right lower extremity pain.  Blood work was obtained.  She was placed on Dilaudid PCA pump and Valium, restarted her home  medicines, placed n.p.o.  She was found to have on her CT scan some  residual stenosis at L4-5, underwent a right L5 selective nerve root  block transforaminally.  This gave her marginal relief.  She continued  to have pain.  She was eating well and voiding well.  Vital signs  stable, afebrile throughout her hospital stay.  Difficulty with  ambulation.  Since her pain persisted, plans were made for taking her  back to the operating room for bilateral laminotomies and foraminotomies  at L4-5 with microscope.  She tolerated this procedure well.  Again, was  monitored postoperatively per  protocol.  We stopped her statin, started  her with mobilization, and the patient continued to improve.  She made  slow progress.  She was ready for discharge home on May 24, 2008.  There were some social issues to address.  Her daughter is where she  lives and her daughter did not want her to come home.  I encouraged her  to discuss these issues with her daughter to resolve the differences.  Her IV was discontinued.  Home health physical therapy and occupational  therapy was set up.  Plans were made to discharge her home.  Given  prescription for OxyContin and Lyrica, continue on her home  medications.  Follow up in 3 weeks with Dr. Danielle Dess.  Contact us prior to  follow up for any questions, concerns, sign of infection, fevers greater  than 103, or any other concerns.  All questions encouraged, answered,  and addressed.  Regular diet.  Continue with back precautions.      Aura Fey Bobbe Medico.      Stefani Dama, M.D.  Electronically Signed    SCI/MEDQ  D:  08/04/2008  T:  08/05/2008  Job:  [id number]

## 2010-11-18 ENCOUNTER — Other Ambulatory Visit: Payer: Self-pay | Admitting: Cardiology

## 2010-11-20 NOTE — Telephone Encounter (Signed)
escribe medication per fax request  

## 2010-11-27 ENCOUNTER — Ambulatory Visit: Payer: Medicare Other | Admitting: Adult Health

## 2010-11-27 ENCOUNTER — Other Ambulatory Visit: Payer: MEDICARE | Admitting: *Deleted

## 2011-03-08 ENCOUNTER — Other Ambulatory Visit: Payer: Self-pay | Admitting: Gastroenterology

## 2011-03-08 DIAGNOSIS — R110 Nausea: Secondary | ICD-10-CM

## 2011-03-11 ENCOUNTER — Ambulatory Visit
Admission: RE | Admit: 2011-03-11 | Discharge: 2011-03-11 | Disposition: A | Discharge status: Home / Self Care | Payer: Medicare Other | Source: Ambulatory Visit | Attending: Gastroenterology | Admitting: Gastroenterology

## 2011-03-11 DIAGNOSIS — R110 Nausea: Secondary | ICD-10-CM

## 2011-03-18 LAB — POCT HEMOGLOBIN-HEMACUE: Operator id: 268271

## 2011-03-18 LAB — URINE CULTURE: Colony Count: 6000

## 2011-03-21 LAB — CBC
HCT: 37.6
HCT: 40.6
HCT: 41.3
Hemoglobin: 12.7
Hemoglobin: 13.9
Hemoglobin: 14.1
MCHC: 33.7
MCHC: 34.1
MCV: 90.1
MCV: 90.6
Platelets: 200
Platelets: 207
Platelets: 246
RBC: 4.18
RBC: 4.58
RDW: 14.1
RDW: 14.2
WBC: 13.8 — ABNORMAL HIGH
WBC: 23.8 — ABNORMAL HIGH
WBC: 25.1 — ABNORMAL HIGH

## 2011-03-21 LAB — COMPREHENSIVE METABOLIC PANEL
ALT: 57 — ABNORMAL HIGH
ALT: 63 — ABNORMAL HIGH
ALT: 74 — ABNORMAL HIGH
AST: 37
AST: 63 — ABNORMAL HIGH
Albumin: 2.9 — ABNORMAL LOW
Alkaline Phosphatase: 59
Alkaline Phosphatase: 60
BUN: 12
BUN: 6
CO2: 24
CO2: 25
Calcium: 8.2 — ABNORMAL LOW
Calcium: 8.6
Chloride: 106
Creatinine, Ser: 0.7
GFR calc Af Amer: >60
GFR calc Af Amer: >60
GFR calc Af Amer: >60
GFR calc non Af Amer: >60
GFR calc non Af Amer: >60
Glucose, Bld: 119 — ABNORMAL HIGH
Glucose, Bld: 127 — ABNORMAL HIGH
Glucose, Bld: 134 — ABNORMAL HIGH
Potassium: 3.2 — ABNORMAL LOW
Potassium: 3.3 — ABNORMAL LOW
Sodium: 133 — ABNORMAL LOW
Sodium: 137
Sodium: 145
Total Bilirubin: 0.4
Total Bilirubin: 0.5
Total Protein: 6.1

## 2011-03-21 LAB — POCT I-STAT 3, ART BLOOD GAS (G3+)
Acid-Base Excess: 2
O2 Saturation: 98
pCO2 arterial: 25 — ABNORMAL LOW

## 2011-03-21 LAB — DIFFERENTIAL
Basophils Absolute: 0
Basophils Absolute: 0
Basophils Relative: 0
Eosinophils Absolute: 0.2
Eosinophils Relative: 0
Eosinophils Relative: 1
Eosinophils Relative: 2
Lymphocytes Relative: 17
Lymphocytes Relative: 2 — ABNORMAL LOW
Lymphs Abs: 2.3
Monocytes Absolute: 1
Monocytes Relative: 1 — ABNORMAL LOW
Monocytes Relative: 7
Neutrophils Relative %: 64

## 2011-03-21 LAB — POCT I-STAT, CHEM 8
Creatinine, Ser: 0.8
Glucose, Bld: 125 — ABNORMAL HIGH
Hemoglobin: 15.6 — ABNORMAL HIGH
Potassium: 3.9

## 2011-03-21 LAB — URINALYSIS, ROUTINE W REFLEX MICROSCOPIC
Glucose, UA: NEGATIVE
Ketones, ur: 15 — AB
pH: 5

## 2011-03-21 LAB — ETHANOL: Alcohol, Ethyl (B): <5

## 2011-03-21 LAB — CARDIAC PANEL(CRET KIN+CKTOT+MB+TROPI)
Relative Index: 1.2
Total CK: 497 — ABNORMAL HIGH

## 2011-03-21 LAB — MAGNESIUM: Magnesium: 1.9

## 2011-03-21 LAB — POCT CARDIAC MARKERS
CKMB, poc: 1.5
Troponin i, poc: <0.05

## 2011-03-21 LAB — RAPID URINE DRUG SCREEN, HOSP PERFORMED
Barbiturates: NOT DETECTED
Cocaine: NOT DETECTED
Opiates: POSITIVE — AB

## 2011-03-21 LAB — BASIC METABOLIC PANEL
BUN: 3 — ABNORMAL LOW
CO2: 25
Chloride: 115 — ABNORMAL HIGH
Creatinine, Ser: 0.71
Glucose, Bld: 101 — ABNORMAL HIGH
Potassium: 3.2 — ABNORMAL LOW

## 2011-03-21 LAB — T4, FREE: Free T4: 1.03

## 2011-03-21 LAB — URINE MICROSCOPIC-ADD ON

## 2011-03-21 LAB — CULTURE, BLOOD (ROUTINE X 2): Culture: NO GROWTH

## 2011-03-21 LAB — URINE CULTURE

## 2011-03-21 LAB — TSH: TSH: 0.801

## 2011-03-25 LAB — POCT I-STAT 4, (NA,K, GLUC, HGB,HCT)
Hemoglobin: 16 — ABNORMAL HIGH
Potassium: 3.8
Sodium: 141

## 2011-03-26 LAB — CBC
HCT: 39.5 % (ref 36.0–46.0)
HCT: 47.8 — ABNORMAL HIGH
Hemoglobin: 13.2
MCHC: 33.2 g/dL (ref 30.0–36.0)
MCHC: 33.9
MCV: 90.5 fL (ref 78.0–100.0)
MCV: 89.9
Platelets: 577 10*3/uL — ABNORMAL HIGH (ref 150–400)
Platelets: 328
RBC: 4.37 MIL/uL (ref 3.87–5.11)
RBC: 4.32
RBC: 5.36 — ABNORMAL HIGH
WBC: 14.2 10*3/uL — ABNORMAL HIGH (ref 4.0–10.5)
WBC: 10.3
WBC: 15.4 — ABNORMAL HIGH

## 2011-03-26 LAB — URINE MICROSCOPIC-ADD ON

## 2011-03-26 LAB — URINALYSIS, ROUTINE W REFLEX MICROSCOPIC
Bilirubin Urine: NEGATIVE
Glucose, UA: NEGATIVE mg/dL
Hgb urine dipstick: NEGATIVE
Ketones, ur: NEGATIVE mg/dL
Nitrite: POSITIVE — AB
Protein, ur: NEGATIVE mg/dL
Specific Gravity, Urine: 1.011 (ref 1.005–1.030)
Specific Gravity, Urine: 1.02 (ref 1.005–1.030)
Urobilinogen, UA: 0.2 mg/dL (ref 0.0–1.0)
pH: 7 (ref 5.0–8.0)

## 2011-03-26 LAB — TYPE AND SCREEN: ABO/RH(D): O POS

## 2011-03-26 LAB — BASIC METABOLIC PANEL
BUN: 7 mg/dL (ref 6–23)
BUN: 7
CO2: 30 mEq/L (ref 19–32)
CO2: 25
Chloride: 103 mEq/L (ref 96–112)
Chloride: 106
Creatinine, Ser: 0.82 mg/dL (ref 0.4–1.2)
GFR calc Af Amer: >60
GFR calc Af Amer: >60
GFR calc non Af Amer: >60
Potassium: 3.8 mEq/L (ref 3.5–5.1)
Potassium: 3.9
Potassium: 4.4
Sodium: 140

## 2011-03-26 LAB — URINE CULTURE

## 2011-03-26 LAB — DIFFERENTIAL
Eosinophils Absolute: 0.2 10*3/uL (ref 0.0–0.7)
Eosinophils Relative: 2 % (ref 0–5)
Lymphocytes Relative: 18 % (ref 12–46)
Lymphs Abs: 2.5 10*3/uL (ref 0.7–4.0)
Monocytes Absolute: 1 10*3/uL (ref 0.1–1.0)

## 2011-03-26 LAB — GLUCOSE, CAPILLARY

## 2011-03-26 LAB — C-REACTIVE PROTEIN: CRP: 4.8 mg/dL — ABNORMAL HIGH (ref <0.6)

## 2011-03-26 LAB — SEDIMENTATION RATE: Sed Rate: 69 mm/hr — ABNORMAL HIGH (ref 0–22)

## 2011-03-26 LAB — ABO/RH: ABO/RH(D): O POS

## 2011-03-29 LAB — COMPREHENSIVE METABOLIC PANEL
ALT: 108 U/L — ABNORMAL HIGH (ref 0–35)
Albumin: 3.3 g/dL — ABNORMAL LOW (ref 3.5–5.2)
Albumin: 3.7 g/dL (ref 3.5–5.2)
Alkaline Phosphatase: 192 U/L — ABNORMAL HIGH (ref 39–117)
BUN: 10 mg/dL (ref 6–23)
CO2: 25 mEq/L (ref 19–32)
Chloride: 104 mEq/L (ref 96–112)
Chloride: 105 mEq/L (ref 96–112)
Creatinine, Ser: 0.91 mg/dL (ref 0.4–1.2)
GFR calc non Af Amer: >60 mL/min (ref >60)
Glucose, Bld: 92 mg/dL (ref 70–99)
Potassium: 4.1 mEq/L (ref 3.5–5.1)
Sodium: 139 mEq/L (ref 135–145)
Total Bilirubin: 1 mg/dL (ref 0.3–1.2)
Total Bilirubin: 1.3 mg/dL — ABNORMAL HIGH (ref 0.3–1.2)
Total Protein: 6.8 g/dL (ref 6.0–8.3)

## 2011-03-29 LAB — CBC
HCT: 41.3 % (ref 36.0–46.0)
Hemoglobin: 12.9 g/dL (ref 12.0–15.0)
Hemoglobin: 13.4 g/dL (ref 12.0–15.0)
Hemoglobin: 14 g/dL (ref 12.0–15.0)
MCHC: 34 g/dL (ref 30.0–36.0)
MCV: 89.6 fL (ref 78.0–100.0)
MCV: 90.4 fL (ref 78.0–100.0)
Platelets: 431 10*3/uL — ABNORMAL HIGH (ref 150–400)
Platelets: 506 10*3/uL — ABNORMAL HIGH (ref 150–400)
RBC: 4.35 MIL/uL (ref 3.87–5.11)
RBC: 4.58 MIL/uL (ref 3.87–5.11)
RDW: 13.2 % (ref 11.5–15.5)
RDW: 13.9 % (ref 11.5–15.5)
RDW: 13.9 % (ref 11.5–15.5)
WBC: 12.6 10*3/uL — ABNORMAL HIGH (ref 4.0–10.5)
WBC: 12.9 10*3/uL — ABNORMAL HIGH (ref 4.0–10.5)
WBC: 26 10*3/uL — ABNORMAL HIGH (ref 4.0–10.5)

## 2011-03-29 LAB — BASIC METABOLIC PANEL
BUN: 9 mg/dL (ref 6–23)
CO2: 29 mEq/L (ref 19–32)
Calcium: 9.1 mg/dL (ref 8.4–10.5)
Calcium: 9.6 mg/dL (ref 8.4–10.5)
Chloride: 103 mEq/L (ref 96–112)
Chloride: 106 mEq/L (ref 96–112)
Creatinine, Ser: 0.81 mg/dL (ref 0.4–1.2)
GFR calc Af Amer: >60 mL/min (ref >60)
GFR calc Af Amer: >60 mL/min (ref >60)
GFR calc non Af Amer: >60 mL/min (ref >60)
Glucose, Bld: 124 mg/dL — ABNORMAL HIGH (ref 70–99)
Potassium: 4.5 mEq/L (ref 3.5–5.1)
Sodium: 135 mEq/L (ref 135–145)

## 2011-03-29 LAB — DIFFERENTIAL
Basophils Absolute: 0.1 10*3/uL (ref 0.0–0.1)
Basophils Absolute: 0.1 10*3/uL (ref 0.0–0.1)
Basophils Absolute: 0.2 10*3/uL — ABNORMAL HIGH (ref 0.0–0.1)
Basophils Relative: 1 % (ref 0–1)
Basophils Relative: 1 % (ref 0–1)
Eosinophils Absolute: 0.1 10*3/uL (ref 0.0–0.7)
Lymphocytes Relative: 18 % (ref 12–46)
Lymphocytes Relative: 29 % (ref 12–46)
Lymphs Abs: 2.3 10*3/uL (ref 0.7–4.0)
Monocytes Absolute: 0.6 10*3/uL (ref 0.1–1.0)
Monocytes Absolute: 1 10*3/uL (ref 0.1–1.0)
Monocytes Relative: 5 % (ref 3–12)
Monocytes Relative: 8 % (ref 3–12)
Neutro Abs: 7.6 10*3/uL (ref 1.7–7.7)
Neutro Abs: 8.8 10*3/uL — ABNORMAL HIGH (ref 1.7–7.7)
Neutro Abs: 9.9 10*3/uL — ABNORMAL HIGH (ref 1.7–7.7)
Neutrophils Relative %: 59 % (ref 43–77)
Neutrophils Relative %: 78 % — ABNORMAL HIGH (ref 43–77)

## 2011-03-29 LAB — CLOSTRIDIUM DIFFICILE EIA: C difficile Toxins A+B, EIA: 4

## 2011-03-29 LAB — URINALYSIS, ROUTINE W REFLEX MICROSCOPIC
Glucose, UA: NEGATIVE mg/dL
Hgb urine dipstick: NEGATIVE
Ketones, ur: NEGATIVE mg/dL
Protein, ur: NEGATIVE mg/dL
pH: 6 (ref 5.0–8.0)

## 2011-04-04 LAB — CBC
HCT: 48.2 — ABNORMAL HIGH
MCV: 91.1
RBC: 5.29 — ABNORMAL HIGH
WBC: 12.2 — ABNORMAL HIGH

## 2011-04-04 LAB — BASIC METABOLIC PANEL
Chloride: 104
GFR calc Af Amer: >60
Potassium: 4.7
Sodium: 142

## 2011-04-26 ENCOUNTER — Other Ambulatory Visit: Payer: Self-pay | Admitting: Cardiology

## 2011-05-02 ENCOUNTER — Telehealth: Payer: Self-pay | Admitting: *Deleted

## 2011-05-02 ENCOUNTER — Encounter: Payer: Self-pay | Admitting: *Deleted

## 2011-05-02 NOTE — Telephone Encounter (Signed)
Have tried several times to get in touch with [name] [name] to schedule a 6 mo FU CT lung scan to fu on lung nodule. Sent letter for her  to call to schedule prior to App 12/3 with Dr. [place]

## 2011-05-07 ENCOUNTER — Other Ambulatory Visit: Payer: Self-pay | Admitting: *Deleted

## 2011-05-07 ENCOUNTER — Telehealth: Payer: Self-pay | Admitting: Cardiology

## 2011-05-07 DIAGNOSIS — I712 Thoracic aortic aneurysm, without rupture: Secondary | ICD-10-CM

## 2011-05-07 NOTE — Telephone Encounter (Signed)
Had sent her a letter to call to schedule CT chest in follow up of asc aortic aneuy. Will schedule and call her with time. Will also need Bmet prior to CT.

## 2011-05-07 NOTE — Telephone Encounter (Signed)
Pt was calling about an xray please call

## 2011-05-09 ENCOUNTER — Telehealth: Payer: Self-pay | Admitting: Cardiology

## 2011-05-09 NOTE — Telephone Encounter (Signed)
Have been sick all night with  N/V, Patient has cancel CT on 11/16 and MD appt on 12/3. Will call back at a later date to reschedule.

## 2011-05-10 ENCOUNTER — Other Ambulatory Visit: Payer: Medicare Other

## 2011-05-10 ENCOUNTER — Other Ambulatory Visit: Payer: Medicare Other | Admitting: *Deleted

## 2011-05-27 ENCOUNTER — Ambulatory Visit: Payer: Medicare Other | Admitting: Cardiology

## 2011-06-06 ENCOUNTER — Emergency Department (HOSPITAL_COMMUNITY): Payer: Medicare Other

## 2011-06-06 ENCOUNTER — Encounter (HOSPITAL_COMMUNITY): Payer: Self-pay | Admitting: Emergency Medicine

## 2011-06-06 ENCOUNTER — Emergency Department (HOSPITAL_COMMUNITY)
Admission: EM | Admit: 2011-06-06 | Discharge: 2011-06-07 | Disposition: A | Discharge status: Home / Self Care | Payer: Medicare Other | Attending: Emergency Medicine | Admitting: Emergency Medicine

## 2011-06-06 DIAGNOSIS — J3489 Other specified disorders of nose and nasal sinuses: Secondary | ICD-10-CM | POA: Insufficient documentation

## 2011-06-06 DIAGNOSIS — R079 Chest pain, unspecified: Secondary | ICD-10-CM | POA: Insufficient documentation

## 2011-06-06 DIAGNOSIS — J069 Acute upper respiratory infection, unspecified: Secondary | ICD-10-CM | POA: Insufficient documentation

## 2011-06-06 DIAGNOSIS — E789 Disorder of lipoprotein metabolism, unspecified: Secondary | ICD-10-CM | POA: Insufficient documentation

## 2011-06-06 DIAGNOSIS — A084 Viral intestinal infection, unspecified: Secondary | ICD-10-CM

## 2011-06-06 DIAGNOSIS — F341 Dysthymic disorder: Secondary | ICD-10-CM | POA: Insufficient documentation

## 2011-06-06 DIAGNOSIS — R0982 Postnasal drip: Secondary | ICD-10-CM | POA: Insufficient documentation

## 2011-06-06 DIAGNOSIS — R112 Nausea with vomiting, unspecified: Secondary | ICD-10-CM | POA: Insufficient documentation

## 2011-06-06 DIAGNOSIS — I10 Essential (primary) hypertension: Secondary | ICD-10-CM | POA: Insufficient documentation

## 2011-06-06 DIAGNOSIS — G35 Multiple sclerosis: Secondary | ICD-10-CM | POA: Insufficient documentation

## 2011-06-06 DIAGNOSIS — E039 Hypothyroidism, unspecified: Secondary | ICD-10-CM | POA: Insufficient documentation

## 2011-06-06 DIAGNOSIS — R059 Cough, unspecified: Secondary | ICD-10-CM | POA: Insufficient documentation

## 2011-06-06 DIAGNOSIS — R05 Cough: Secondary | ICD-10-CM | POA: Insufficient documentation

## 2011-06-06 DIAGNOSIS — R0602 Shortness of breath: Secondary | ICD-10-CM | POA: Insufficient documentation

## 2011-06-06 DIAGNOSIS — A088 Other specified intestinal infections: Secondary | ICD-10-CM | POA: Insufficient documentation

## 2011-06-06 DIAGNOSIS — R197 Diarrhea, unspecified: Secondary | ICD-10-CM | POA: Insufficient documentation

## 2011-06-06 DIAGNOSIS — E785 Hyperlipidemia, unspecified: Secondary | ICD-10-CM | POA: Insufficient documentation

## 2011-06-06 DIAGNOSIS — J329 Chronic sinusitis, unspecified: Secondary | ICD-10-CM | POA: Insufficient documentation

## 2011-06-06 DIAGNOSIS — Z9889 Other specified postprocedural states: Secondary | ICD-10-CM | POA: Insufficient documentation

## 2011-06-06 DIAGNOSIS — F17200 Nicotine dependence, unspecified, uncomplicated: Secondary | ICD-10-CM | POA: Insufficient documentation

## 2011-06-06 DIAGNOSIS — R5381 Other malaise: Secondary | ICD-10-CM | POA: Insufficient documentation

## 2011-06-06 LAB — URINALYSIS, ROUTINE W REFLEX MICROSCOPIC
Bilirubin Urine: NEGATIVE
Glucose, UA: NEGATIVE mg/dL
Ketones, ur: NEGATIVE mg/dL
Leukocytes, UA: NEGATIVE
pH: 6 (ref 5.0–8.0)

## 2011-06-06 LAB — URINE MICROSCOPIC-ADD ON

## 2011-06-06 MED ORDER — HYDROCODONE-ACETAMINOPHEN 5-325 MG PO TABS: 2.0000 | ORAL_TABLET | ORAL | Status: DC | PRN

## 2011-06-06 MED ORDER — DOXYCYCLINE HYCLATE 100 MG PO TABS
100.0000 mg | ORAL_TABLET | Freq: Once | ORAL | Status: AC
  Administered 2011-06-06: 100 mg via ORAL
  Filled 2011-06-06: qty 1

## 2011-06-06 MED ORDER — ONDANSETRON HCL 4 MG/2ML IJ SOLN
4.0000 mg | Freq: Once | INTRAMUSCULAR | Status: AC
  Administered 2011-06-06: 4 mg via INTRAVENOUS
  Filled 2011-06-06: qty 2

## 2011-06-06 MED ORDER — FAMOTIDINE 40 MG PO TABS: 20.0000 mg | ORAL_TABLET | Freq: Two times a day (BID) | ORAL | Status: DC | PRN

## 2011-06-06 MED ORDER — SODIUM CHLORIDE 0.9 % IV BOLUS (SEPSIS)
500.0000 mL | Freq: Once | INTRAVENOUS | Status: AC
  Administered 2011-06-06: 500 mL via INTRAVENOUS

## 2011-06-06 MED ORDER — HYDROCODONE-ACETAMINOPHEN 5-325 MG PO TABS
ORAL_TABLET | ORAL | Status: AC
  Filled 2011-06-06: qty 1

## 2011-06-06 MED ORDER — HYDROCODONE-ACETAMINOPHEN 5-325 MG PO TABS
2.0000 | ORAL_TABLET | Freq: Once | ORAL | Status: AC
  Administered 2011-06-06: 2 via ORAL
  Filled 2011-06-06: qty 2

## 2011-06-06 MED ORDER — DOXYCYCLINE HYCLATE 100 MG PO TABS: 100.0000 mg | ORAL_TABLET | Freq: Two times a day (BID) | ORAL | Status: AC

## 2011-06-06 MED ORDER — ONDANSETRON HCL 8 MG PO TABS: 8.0000 mg | ORAL_TABLET | Freq: Three times a day (TID) | ORAL | Status: AC | PRN

## 2011-06-06 MED ORDER — FAMOTIDINE IN NACL 20-0.9 MG/50ML-% IV SOLN
20.0000 mg | Freq: Once | INTRAVENOUS | Status: AC
  Administered 2011-06-06: 20 mg via INTRAVENOUS
  Filled 2011-06-06: qty 50

## 2011-06-06 NOTE — ED Notes (Signed)
PER EMS- Pt states she has had n/v/d for two weeks. Woke up this morning with SOB, been coughing up sputum.

## 2011-06-06 NOTE — ED Provider Notes (Signed)
History     CSN: [id number] Arrival date & time: 06/06/2011  8:19 PM   First MD Initiated Contact with Patient 06/06/11 2202      Chief Complaint  Patient presents with  . Nausea    (Consider location/radiation/quality/duration/timing/severity/associated sxs/prior treatment) HPI Comments: The patient is a 70 year old female who presents for evaluation of a sinus infection that she believes she has stage II bilateral maxillary pressure and nasal congestion with postnasal drip and rhinorrhea for 2-3 weeks. She was treated previously by her primary care physician through [facility] family physicians at [facility] with an unknown antibiotic approximately 2 weeks ago with mild improvement shortly after the antibiotic course but return of symptoms afterward. She denies any ear pain or sore throat but reports occasional headache at the frontal region and the maxillary sinuses. She denies any fever. She reports that today she woke up and felt short of breath and had a cough that was productive of yellowish sputum. She reports that she has some chest discomfort with coughing only but no chest discomfort at rest. The chest discomfort is described as aching and localized to the bilateral peristernal region and nonradiating. She also reports the onset of nausea, vomiting, and diarrhea today. She reports approximately 3 episodes of vomiting and diarrhea at present complains of no nausea. She denies any abdominal pain.   Past Medical History  Diagnosis Date  . HTN (hypertension)   . Ascending aortic aneurysm   . Dyslipidemia   . Tobacco dependence   . Hypothyroidism   . Depression   . Neurogenic bladder   . Obstructive sleep apnea   . Obesity   . Dog bite     RLL with systemic inflammatory response  . Chronic low back pain   . Anxiety   . MS (multiple sclerosis)     Followed by Dr. Sandria Manly  . High cholesterol   . Aortic aneurysm     Past Surgical History  Procedure Date  . Cholecystectomy   .  Hysterectomy   . Nasal sinus surgery   . Left eye surgery   . Bilateral foot surgery     No family history on file.  History  Substance Use Topics  . Smoking status: Current Everyday Smoker  . Smokeless tobacco: Never Used  . Alcohol Use: Yes     Rare alcohol use    OB History    Grav Para Term Preterm Abortions TAB SAB Ect Mult Living                  Review of Systems  Constitutional: Positive for appetite change and fatigue. Negative for fever, chills, diaphoresis, activity change and unexpected weight change.  HENT: Positive for congestion, rhinorrhea, postnasal drip and sinus pressure. Negative for hearing loss, ear pain, sore throat, facial swelling, drooling, mouth sores, neck pain, neck stiffness, dental problem and ear discharge.   Eyes: Negative.   Respiratory: Positive for cough and shortness of breath. Negative for choking, chest tightness, wheezing and stridor.   Cardiovascular: Positive for chest pain. Negative for palpitations and leg swelling.  Gastrointestinal: Positive for nausea, vomiting and diarrhea. Negative for abdominal pain, constipation, blood in stool, abdominal distention, anal bleeding and rectal pain.  Genitourinary: Negative for dysuria, frequency, hematuria, flank pain, decreased urine volume, difficulty urinating and pelvic pain.  Musculoskeletal: Positive for myalgias. Negative for back pain, joint swelling, arthralgias and gait problem.  Skin: Negative for color change, pallor, rash and wound.  Neurological: Positive for headaches. Negative for  dizziness, weakness, light-headedness and numbness.  Hematological: Positive for adenopathy. Does not bruise/bleed easily.  Psychiatric/Behavioral: Negative.     Allergies  Codeine; Demerol; and Penicillins  Home Medications   Current Outpatient Rx  Name Route Sig Dispense Refill  . ASPIRIN 81 MG PO CHEW Oral Chew 81 mg by mouth daily.      . ATORVASTATIN CALCIUM 40 MG PO TABS Oral Take 40 mg by  mouth daily.     Marland Kitchen CALCIUM + D PO Oral Take by mouth daily.      Marland Kitchen CLONAZEPAM 1 MG PO TABS Oral Take 1 mg by mouth daily.      Marland Kitchen ESOMEPRAZOLE MAGNESIUM 20 MG PO CPDR Oral Take 20 mg by mouth daily before breakfast.      . FLUOXETINE HCL 40 MG PO CAPS Oral Take 40 mg by mouth daily.      Marland Kitchen HYDROCODONE-ACETAMINOPHEN 5-500 MG PO TABS Oral Take 1 tablet by mouth every 6 (six) hours as needed. For pain.     Marland Kitchen LEVOTHYROXINE SODIUM 200 MCG PO TABS  TAKE ONE TABLET DAILY 30 tablet 5  . LISINOPRIL 20 MG PO TABS Oral Take 20 mg by mouth daily.      . NEBIVOLOL HCL 10 MG PO TABS Oral Take 10 mg by mouth daily.      Marland Kitchen TIOTROPIUM BROMIDE MONOHYDRATE 18 MCG IN CAPS Inhalation Place 1 capsule (18 mcg total) into inhaler and inhale daily. 30 capsule 2  . TIZANIDINE HCL 4 MG PO TABS Oral Take 8 mg by mouth at bedtime.      Marland Kitchen VITAMIN D (ERGOCALCIFEROL) PO Oral Take 2 tablets by mouth daily. States that she takes 4000 units daily       BP 136/81  Pulse 54  Temp(Src) 98.4 F (36.9 C) (Oral)  Resp 23  SpO2 99%  Physical Exam  Nursing note and vitals reviewed. Constitutional: She is oriented to person, place, and time. She appears well-developed and well-nourished. No distress.  HENT:  Head: Normocephalic and atraumatic.  Right Ear: External ear normal.  Left Ear: External ear normal.  Nose: Mucosal edema and rhinorrhea present. No sinus tenderness. Right sinus exhibits maxillary sinus tenderness and frontal sinus tenderness. Left sinus exhibits maxillary sinus tenderness and frontal sinus tenderness.  Mouth/Throat: Uvula is midline, oropharynx is clear and moist and mucous membranes are normal.  Eyes: Conjunctivae and EOM are normal. Pupils are equal, round, and reactive to light. Right eye exhibits no discharge. Left eye exhibits no discharge. No scleral icterus.  Neck: Normal range of motion. Neck supple. No JVD present. No tracheal deviation present.  Cardiovascular: Normal rate, regular rhythm, normal  heart sounds and intact distal pulses.  Exam reveals no gallop and no friction rub.   No murmur heard. Pulmonary/Chest: Effort normal. No accessory muscle usage or stridor. Not tachypneic. No respiratory distress. She has no decreased breath sounds. She has no wheezes. She has rhonchi. She has no rales. She exhibits no tenderness.  Abdominal: Soft. Bowel sounds are normal. She exhibits no distension and no mass. There is no tenderness. There is no rebound and no guarding.  Musculoskeletal: Normal range of motion. She exhibits no edema and no tenderness.  Lymphadenopathy:    She has no cervical adenopathy.  Neurological: She is alert and oriented to person, place, and time. She has normal reflexes. No cranial nerve deficit. She exhibits normal muscle tone. Coordination normal.  Skin: Skin is warm and dry. No rash noted. She is not diaphoretic.  No erythema. No pallor.  Psychiatric: She has a normal mood and affect. Her behavior is normal. Judgment and thought content normal.    ED Course  Procedures (including critical care time)  Labs Reviewed  URINALYSIS, ROUTINE W REFLEX MICROSCOPIC - Abnormal; Notable for the following:    Hgb urine dipstick TRACE (*)    All other components within normal limits  URINE MICROSCOPIC-ADD ON   Dg Chest 2 View  06/06/2011  *RADIOLOGY REPORT*  Clinical Data: Productive cough for several weeks.  Possible pneumonia.  CHEST - 2 VIEW  Comparison: 05/25/2010  Findings: Normal heart size and pulmonary vascularity.  Mild peribronchial thickening and interstitial changes suggesting chronic bronchitic change.  No focal airspace consolidation in the lungs.  No blunting of costophrenic angles.  No pneumothorax.  Hila appear symmetrical.  No significant changes since the previous study.  IMPRESSION: Changes of chronic bronchitis.  No evidence of active pulmonary disease.  Original Report Authenticated By: Marlon Pel, M.D.     No diagnosis found.    MDM  The  patient has findings and symptoms compatible with acute sinusitis that has failed one course of treatment. She is allergic to penicillins. I will treat her with doxycycline for 14 days for sinusitis and sinus infection. Otherwise, her chest x-ray is negative for pneumonia and based on the symptoms and findings on exam suggestive for bronchitis amidst possible COPD, although there is no wheezing. I do not feel that she needs steroids or bronchodilators, but I will treat her cough with hydrocodone which will also treat her sinus and chest discomfort with coughing. The doxycycline would treat any bacterial component of bronchitis although I suspect this is viral. Otherwise she appears to have a viral gastroenteritis which I will treat symptomatically with Pepcid and Zofran. She is nontender throughout her abdomen and has a negative urinalysis for infection and I do not suspect acute cholecystitis, pancreatitis or other acute intra-abdominal process. I will discharge the patient home with antibiotics and symptomatic treatment. The patient states her understanding of and agreement with this plan of care.        Felisa Bonier, MD 06/06/11 [phone]

## 2011-06-12 ENCOUNTER — Emergency Department (HOSPITAL_COMMUNITY)
Admission: EM | Admit: 2011-06-12 | Discharge: 2011-06-12 | Disposition: A | Discharge status: Home / Self Care | Payer: Medicare Other | Attending: Emergency Medicine | Admitting: Emergency Medicine

## 2011-06-12 ENCOUNTER — Encounter (HOSPITAL_COMMUNITY): Payer: Self-pay | Admitting: *Deleted

## 2011-06-12 ENCOUNTER — Emergency Department (HOSPITAL_COMMUNITY): Payer: Medicare Other

## 2011-06-12 DIAGNOSIS — Z7982 Long term (current) use of aspirin: Secondary | ICD-10-CM | POA: Insufficient documentation

## 2011-06-12 DIAGNOSIS — E789 Disorder of lipoprotein metabolism, unspecified: Secondary | ICD-10-CM | POA: Insufficient documentation

## 2011-06-12 DIAGNOSIS — E785 Hyperlipidemia, unspecified: Secondary | ICD-10-CM | POA: Insufficient documentation

## 2011-06-12 DIAGNOSIS — R10819 Abdominal tenderness, unspecified site: Secondary | ICD-10-CM | POA: Insufficient documentation

## 2011-06-12 DIAGNOSIS — I10 Essential (primary) hypertension: Secondary | ICD-10-CM | POA: Insufficient documentation

## 2011-06-12 DIAGNOSIS — R197 Diarrhea, unspecified: Secondary | ICD-10-CM | POA: Insufficient documentation

## 2011-06-12 DIAGNOSIS — R112 Nausea with vomiting, unspecified: Secondary | ICD-10-CM | POA: Insufficient documentation

## 2011-06-12 DIAGNOSIS — E039 Hypothyroidism, unspecified: Secondary | ICD-10-CM | POA: Insufficient documentation

## 2011-06-12 DIAGNOSIS — R1013 Epigastric pain: Secondary | ICD-10-CM | POA: Insufficient documentation

## 2011-06-12 DIAGNOSIS — Z9889 Other specified postprocedural states: Secondary | ICD-10-CM | POA: Insufficient documentation

## 2011-06-12 DIAGNOSIS — R109 Unspecified abdominal pain: Secondary | ICD-10-CM | POA: Insufficient documentation

## 2011-06-12 DIAGNOSIS — Z79899 Other long term (current) drug therapy: Secondary | ICD-10-CM | POA: Insufficient documentation

## 2011-06-12 DIAGNOSIS — R059 Cough, unspecified: Secondary | ICD-10-CM | POA: Insufficient documentation

## 2011-06-12 DIAGNOSIS — G35 Multiple sclerosis: Secondary | ICD-10-CM | POA: Insufficient documentation

## 2011-06-12 DIAGNOSIS — R630 Anorexia: Secondary | ICD-10-CM | POA: Insufficient documentation

## 2011-06-12 DIAGNOSIS — F341 Dysthymic disorder: Secondary | ICD-10-CM | POA: Insufficient documentation

## 2011-06-12 DIAGNOSIS — R05 Cough: Secondary | ICD-10-CM | POA: Insufficient documentation

## 2011-06-12 DIAGNOSIS — F17200 Nicotine dependence, unspecified, uncomplicated: Secondary | ICD-10-CM | POA: Insufficient documentation

## 2011-06-12 LAB — POCT I-STAT, CHEM 8
Calcium, Ion: 1.15 mmol/L (ref 1.12–1.32)
Chloride: 103 mEq/L (ref 96–112)
HCT: 50 % — ABNORMAL HIGH (ref 36.0–46.0)
Potassium: 3.5 mEq/L (ref 3.5–5.1)

## 2011-06-12 LAB — COMPREHENSIVE METABOLIC PANEL
Albumin: 3.9 g/dL (ref 3.5–5.2)
BUN: 8 mg/dL (ref 6–23)
Creatinine, Ser: 0.87 mg/dL (ref 0.50–1.10)
Potassium: 3.5 mEq/L (ref 3.5–5.1)
Total Protein: 7.7 g/dL (ref 6.0–8.3)

## 2011-06-12 LAB — LIPASE, BLOOD: Lipase: 23 U/L (ref 11–59)

## 2011-06-12 LAB — DIFFERENTIAL
Basophils Relative: 0 % (ref 0–1)
Eosinophils Absolute: 0 10*3/uL (ref 0.0–0.7)
Monocytes Absolute: 0.8 10*3/uL (ref 0.1–1.0)
Monocytes Relative: 7 % (ref 3–12)
Neutrophils Relative %: 73 % (ref 43–77)

## 2011-06-12 LAB — URINALYSIS, ROUTINE W REFLEX MICROSCOPIC
Leukocytes, UA: NEGATIVE
Nitrite: NEGATIVE
Specific Gravity, Urine: 1.01 (ref 1.005–1.030)
pH: 6 (ref 5.0–8.0)

## 2011-06-12 LAB — CBC
Hemoglobin: 16.1 g/dL — ABNORMAL HIGH (ref 12.0–15.0)
MCH: 29.8 pg (ref 26.0–34.0)
MCHC: 33.8 g/dL (ref 30.0–36.0)
RDW: 13.1 % (ref 11.5–15.5)

## 2011-06-12 LAB — URINE MICROSCOPIC-ADD ON

## 2011-06-12 MED ORDER — PROMETHAZINE HCL 25 MG PO TABS: 25.0000 mg | ORAL_TABLET | Freq: Three times a day (TID) | ORAL | Status: DC | PRN

## 2011-06-12 MED ORDER — ONDANSETRON HCL 4 MG/2ML IJ SOLN
4.0000 mg | Freq: Once | INTRAMUSCULAR | Status: AC
  Administered 2011-06-12: 4 mg via INTRAVENOUS

## 2011-06-12 MED ORDER — PROMETHAZINE HCL 25 MG/ML IJ SOLN
12.5000 mg | INTRAMUSCULAR | Status: AC
  Administered 2011-06-12: 12.5 mg via INTRAVENOUS
  Filled 2011-06-12: qty 1

## 2011-06-12 MED ORDER — ONDANSETRON HCL 4 MG/2ML IJ SOLN
4.0000 mg | Freq: Once | INTRAMUSCULAR | Status: DC
  Filled 2011-06-12: qty 2

## 2011-06-12 MED ORDER — ONDANSETRON HCL 4 MG/2ML IJ SOLN
4.0000 mg | Freq: Once | INTRAMUSCULAR | Status: AC
  Administered 2011-06-12: 4 mg via INTRAVENOUS
  Filled 2011-06-12: qty 2

## 2011-06-12 MED ORDER — PROMETHAZINE HCL 25 MG/ML IJ SOLN: 12.5000 mg | INTRAMUSCULAR | Status: DC

## 2011-06-12 MED ORDER — TIZANIDINE HCL 4 MG PO TABS
4.0000 mg | ORAL_TABLET | Freq: Once | ORAL | Status: DC
  Filled 2011-06-12: qty 1

## 2011-06-12 MED ORDER — SODIUM CHLORIDE 0.9 % IV SOLN
Freq: Once | INTRAVENOUS | Status: AC
  Administered 2011-06-12: 12:00:00 via INTRAVENOUS

## 2011-06-12 MED ORDER — PROMETHAZINE HCL 25 MG RE SUPP: 25.0000 mg | Freq: Four times a day (QID) | RECTAL | Status: DC | PRN

## 2011-06-12 MED ORDER — IOHEXOL 300 MG/ML  SOLN
80.0000 mL | Freq: Once | INTRAMUSCULAR | Status: AC | PRN
  Administered 2011-06-12: 80 mL via INTRAVENOUS

## 2011-06-12 MED ORDER — METOCLOPRAMIDE HCL 5 MG/ML IJ SOLN
10.0000 mg | Freq: Once | INTRAMUSCULAR | Status: AC
  Administered 2011-06-12: 10 mg via INTRAVENOUS
  Filled 2011-06-12: qty 2

## 2011-06-12 MED ORDER — SODIUM CHLORIDE 0.9 % IV BOLUS (SEPSIS)
1000.0000 mL | Freq: Once | INTRAVENOUS | Status: AC
  Administered 2011-06-12: 1000 mL via INTRAVENOUS

## 2011-06-12 NOTE — ED Provider Notes (Signed)
History     CSN: [id number] Arrival date & time: 06/12/2011 10:58 AM   First MD Initiated Contact with Patient 06/12/11 1138      Chief Complaint  Patient presents with  . Nausea    (Consider location/radiation/quality/duration/timing/severity/associated sxs/prior treatment) Patient is a 70 y.o. female presenting with abdominal pain. The history is provided by the patient.  Abdominal Pain The primary symptoms of the illness include abdominal pain, nausea, vomiting and diarrhea. The primary symptoms of the illness do not include fever, shortness of breath or dysuria. The current episode started more than 2 days ago. The onset of the illness was gradual. The problem has been gradually worsening.  The abdominal pain began more than 2 days ago. The pain came on gradually. The abdominal pain has been gradually worsening since its onset. The abdominal pain is located in the epigastric region. The abdominal pain does not radiate. The severity of the abdominal pain is 7/10. The abdominal pain is relieved by nothing. The abdominal pain is exacerbated by vomiting.  Additional symptoms associated with the illness include anorexia. Symptoms associated with the illness do not include chills, constipation, urgency, frequency or back pain.  Pt reports nausea feeling for last 4 months. States in the last 3 weeks, now having vomiting and upper abdominal pain, diarrhea. States in the last week unable to keep anything down. Was seen in ER a week ago, d/c home. Was seen by her primary care doctor yesterday in the office, was given a shot of phenergan, states it did not improve her nausea. Denies fever, chills, chest pain, SOB.  Past Medical History  Diagnosis Date  . HTN (hypertension)   . Ascending aortic aneurysm   . Dyslipidemia   . Tobacco dependence   . Hypothyroidism   . Depression   . Neurogenic bladder   . Obstructive sleep apnea   . Obesity   . Dog bite     RLL with systemic inflammatory  response  . Chronic low back pain   . Anxiety   . MS (multiple sclerosis)     Followed by Dr. Sandria Manly  . High cholesterol   . Aortic aneurysm     Past Surgical History  Procedure Date  . Cholecystectomy   . Hysterectomy   . Nasal sinus surgery   . Left eye surgery   . Bilateral foot surgery     No family history on file.  History  Substance Use Topics  . Smoking status: Current Everyday Smoker  . Smokeless tobacco: Never Used  . Alcohol Use: Yes     Rare alcohol use    OB History    Grav Para Term Preterm Abortions TAB SAB Ect Mult Living                  Review of Systems  Constitutional: Negative for fever and chills.  HENT: Negative.   Eyes: Negative.   Respiratory: Positive for cough. Negative for chest tightness and shortness of breath.   Cardiovascular: Negative for chest pain and leg swelling.  Gastrointestinal: Positive for nausea, vomiting, abdominal pain, diarrhea and anorexia. Negative for constipation.  Genitourinary: Negative for dysuria, urgency and frequency.  Musculoskeletal: Negative for back pain.  Skin: Negative.   Neurological: Negative.   Psychiatric/Behavioral: Negative.     Allergies  Codeine; Demerol; and Penicillins  Home Medications   Current Outpatient Rx  Name Route Sig Dispense Refill  . ASPIRIN 81 MG PO CHEW Oral Chew 81 mg by mouth daily.      Marland Kitchen  ATORVASTATIN CALCIUM 40 MG PO TABS Oral Take 40 mg by mouth daily.     Marland Kitchen CALCIUM + D PO Oral Take by mouth daily.      Marland Kitchen CLONAZEPAM 1 MG PO TABS Oral Take 1 mg by mouth daily.      Marland Kitchen DOXYCYCLINE HYCLATE 100 MG PO TABS Oral Take 1 tablet (100 mg total) by mouth 2 (two) times daily. 28 tablet 0  . ESOMEPRAZOLE MAGNESIUM 20 MG PO CPDR Oral Take 20 mg by mouth daily before breakfast.      . FAMOTIDINE 40 MG PO TABS Oral Take 0.5 tablets (20 mg total) by mouth 2 (two) times daily as needed (upset stomach). 12 tablet 0  . FLUOXETINE HCL 40 MG PO CAPS Oral Take 40 mg by mouth daily.      Marland Kitchen  HYDROCODONE-ACETAMINOPHEN 5-325 MG PO TABS Oral Take 2 tablets by mouth every 4 (four) hours as needed for pain (cough). 20 tablet 0  . HYDROCODONE-ACETAMINOPHEN 5-500 MG PO TABS Oral Take 1 tablet by mouth every 6 (six) hours as needed. For pain.     Marland Kitchen LEVOTHYROXINE SODIUM 200 MCG PO TABS  TAKE ONE TABLET DAILY 30 tablet 5  . LISINOPRIL 20 MG PO TABS Oral Take 20 mg by mouth daily.      . NEBIVOLOL HCL 10 MG PO TABS Oral Take 10 mg by mouth daily.      Marland Kitchen ONDANSETRON HCL 8 MG PO TABS Oral Take 1 tablet (8 mg total) by mouth every 8 (eight) hours as needed for nausea. 12 tablet 0  . TIOTROPIUM BROMIDE MONOHYDRATE 18 MCG IN CAPS Inhalation Place 1 capsule (18 mcg total) into inhaler and inhale daily. 30 capsule 2  . TIZANIDINE HCL 4 MG PO TABS Oral Take 8 mg by mouth at bedtime.      Marland Kitchen VITAMIN D (ERGOCALCIFEROL) PO Oral Take 2 tablets by mouth daily. States that she takes 4000 units daily       BP 148/79  Pulse 56  Temp(Src) 98.4 F (36.9 C) (Oral)  Resp 16  SpO2 96%  Physical Exam  Nursing note and vitals reviewed. Constitutional: She is oriented to person, place, and time. She appears well-developed and well-nourished.       Uncomfortable appearing  Eyes: Conjunctivae are normal.  Neck: Neck supple.  Cardiovascular: Normal rate, regular rhythm and normal heart sounds.   Pulmonary/Chest: Effort normal and breath sounds normal. No respiratory distress.  Abdominal: Soft. Bowel sounds are normal. There is tenderness in the right upper quadrant, epigastric area and left upper quadrant. There is no rigidity, no rebound, no guarding and negative Murphy's sign.  Neurological: She is alert and oriented to person, place, and time.  Skin: Skin is warm and dry. No erythema.  Psychiatric: She has a normal mood and affect.    ED Course  Procedures (including critical care time)  Results for orders placed during the hospital encounter of 06/12/11  CBC      Component Value Range   WBC 11.5 (*)  4.0 - 10.5 (K/uL)   RBC 5.40 (*) 3.87 - 5.11 (MIL/uL)   Hemoglobin 16.1 (*) 12.0 - 15.0 (g/dL)   HCT 02.7 (*) 25.3 - 46.0 (%)   MCV 88.1  78.0 - 100.0 (fL)   MCH 29.8  26.0 - 34.0 (pg)   MCHC 33.8  30.0 - 36.0 (g/dL)   RDW 66.4  40.3 - 47.4 (%)   Platelets 334  150 - 400 (K/uL)  DIFFERENTIAL  Component Value Range   Neutrophils Relative 73  43 - 77 (%)   Neutro Abs 8.4 (*) 1.7 - 7.7 (K/uL)   Lymphocytes Relative 19  12 - 46 (%)   Lymphs Abs 2.2  0.7 - 4.0 (K/uL)   Monocytes Relative 7  3 - 12 (%)   Monocytes Absolute 0.8  0.1 - 1.0 (K/uL)   Eosinophils Relative 0  0 - 5 (%)   Eosinophils Absolute 0.0  0.0 - 0.7 (K/uL)   Basophils Relative 0  0 - 1 (%)   Basophils Absolute 0.1  0.0 - 0.1 (K/uL)  COMPREHENSIVE METABOLIC PANEL      Component Value Range   Sodium 140  135 - 145 (mEq/L)   Potassium 3.5  3.5 - 5.1 (mEq/L)   Chloride 101  96 - 112 (mEq/L)   CO2 26  19 - 32 (mEq/L)   Glucose, Bld 103 (*) 70 - 99 (mg/dL)   BUN 8  6 - 23 (mg/dL)   Creatinine, Ser 1.61  0.50 - 1.10 (mg/dL)   Calcium 09.6  8.4 - 10.5 (mg/dL)   Total Protein 7.7  6.0 - 8.3 (g/dL)   Albumin 3.9  3.5 - 5.2 (g/dL)   AST 33  0 - 37 (U/L)   ALT 32  0 - 35 (U/L)   Alkaline Phosphatase 101  39 - 117 (U/L)   Total Bilirubin 0.6  0.3 - 1.2 (mg/dL)   GFR calc non Af Amer 67 (*) >90 (mL/min)   GFR calc Af Amer 78 (*) >90 (mL/min)  LIPASE, BLOOD      Component Value Range   Lipase 23  11 - 59 (U/L)  URINALYSIS, ROUTINE W REFLEX MICROSCOPIC      Component Value Range   Color, Urine YELLOW  YELLOW    APPearance CLEAR  CLEAR    Specific Gravity, Urine 1.010  1.005 - 1.030    pH 6.0  5.0 - 8.0    Glucose, UA NEGATIVE  NEGATIVE (mg/dL)   Hgb urine dipstick TRACE (*) NEGATIVE    Bilirubin Urine NEGATIVE  NEGATIVE    Ketones, ur NEGATIVE  NEGATIVE (mg/dL)   Protein, ur NEGATIVE  NEGATIVE (mg/dL)   Urobilinogen, UA 0.2  0.0 - 1.0 (mg/dL)   Nitrite NEGATIVE  NEGATIVE    Leukocytes, UA NEGATIVE  NEGATIVE     POCT I-STAT, CHEM 8      Component Value Range   Sodium 141  135 - 145 (mEq/L)   Potassium 3.5  3.5 - 5.1 (mEq/L)   Chloride 103  96 - 112 (mEq/L)   BUN 8  6 - 23 (mg/dL)   Creatinine, Ser 0.45  0.50 - 1.10 (mg/dL)   Glucose, Bld 409 (*) 70 - 99 (mg/dL)   Calcium, Ion 8.11  9.14 - 1.32 (mmol/L)   TCO2 28  0 - 100 (mmol/L)   Hemoglobin 17.0 (*) 12.0 - 15.0 (g/dL)   HCT 78.2 (*) 95.6 - 46.0 (%)  URINE MICROSCOPIC-ADD ON      Component Value Range   Squamous Epithelial / LPF MANY (*) RARE    RBC / HPF 0-2  <3 (RBC/hpf)   Urine-Other MUCOUS PRESENT     Dg Chest 2 View  06/06/2011  *RADIOLOGY REPORT*  Clinical Data: Productive cough for several weeks.  Possible pneumonia.  CHEST - 2 VIEW  Comparison: 05/25/2010  Findings: Normal heart size and pulmonary vascularity.  Mild peribronchial thickening and interstitial changes suggesting chronic bronchitic change.  No focal  airspace consolidation in the lungs.  No blunting of costophrenic angles.  No pneumothorax.  Hila appear symmetrical.  No significant changes since the previous study.  IMPRESSION: Changes of chronic bronchitis.  No evidence of active pulmonary disease.  Original Report Authenticated By: Marlon Pel, M.D.   Ct Abdomen Pelvis W Contrast  06/12/2011  *RADIOLOGY REPORT*  Clinical Data: Abdominal pain, nausea, vomiting  CT ABDOMEN AND PELVIS WITH CONTRAST  Technique:  Multidetector CT imaging of the abdomen and pelvis was performed following the standard protocol during bolus administration of intravenous contrast.  Contrast: 80mL OMNIPAQUE IOHEXOL 300 MG/ML IV SOLN  Comparison: None.  Findings: There is a 5 mm subpleural noncalcified nodule in the right lower lobe posterolaterally. If the patient is at high risk for bronchogenic carcinoma, follow-up chest CT at 6-12 months is recommended.  If the patient is at low risk for bronchogenic carcinoma, follow-up chest CT at 12 months is recommended.  This recommendation follows the  consensus statement: Guidelines for Management of Small Pulmonary Nodules Detected on CT Scans: A Statement from the Fleischner Society as published in Radiology 2005; 237:395-400. Online at: DietDisorder.cz.  The liver enhances with no focal abnormality noted.  No ductal dilatation is seen.  The gallbladder appears to have been resected. The common bile duct is prominent most likely normal post cholecystectomy.  The pancreas is fatty infiltrated.  The adrenal glands and spleen are unremarkable.  The stomach is decompressed and cannot be evaluated.  The kidneys enhance with no calculus, and there is a cyst emanating from the lower pole of the right kidney measuring 5.5 x 5.1 cm.  The abdominal aorta is normal in caliber with moderate atheromatous change present.  There is a calcified aneurysm probably emanating from the proximal common hepatic artery of 1.8 cm in diameter. No complicating features are seen.  The urinary bladder is not well distended but is unremarkable.  The uterus has been removed.  No adnexal lesion is seen.  No fluid is noted within the pelvis.  The terminal ileum is unremarkable.  The appendix is not definitely seen.  Anterior fusion is noted at the L4-5.  IMPRESSION:  1.  No explanation for the patient's pain is seen. 2.  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  No complicating features. 3.  5 mm noncalcified subpleural nodule in the right lower lobe. Followup recommendations are given above.  Original Report Authenticated By: Juline Patch, M.D.    Pt with abdominal pain, nausea, vomiting, diarrhea for 3 weeks. Will get labs, CT abdomen for further evaluation. Nausea currently controlled with 4mg  of zofran.  Pt placed in CDU awaiting CT scan and for management of her nausea. Plan if CT negative to d/c home if nausea under control and able to tolerate POs. Pt has PCP and gastroenterologist for follow up.   MDM          Lottie Mussel, PA 06/13/11 [phone]

## 2011-06-12 NOTE — ED Notes (Signed)
Returned from ct 

## 2011-06-12 NOTE — ED Provider Notes (Signed)
3:39 PM Patient is in CDU holding for CT abd/pelvis.  Pt with several weeks of N/V and abdominal pain.  PCP is [facility] and GI is Dr Evette Cristal.  Patient is currently in CT.    4:20 PM Discussed results with patient.  Patient lives at home with daughter - per daughter, patient has not been able to keep anything down for two weeks.  Will give IVF, nausea medications, attempt PO trial.  Patient only has zofran at home - if able to control symptoms here will d/c home with reglan or phenergan and GI follow up.  Patient has seen GI Dr Evette Cristal recently with normal upper endoscopy - per patient Dr Evette Cristal also wanted to do colonoscopy but she did not want to have it done for fear of complications.    5:49 PM Patient is tolerating PO and requested a sandwich, which she ate and tolerated well.  Nausea resolved.  Plan is for d/c home with GI follow up.  Have prescribed phenergan PO and PR, and have discussed how and when to use each with patient.  Pt verbalizes understanding and agrees with plan.    Dillard Cannon Wilson, [place] 06/12/11 2033

## 2011-06-12 NOTE — ED Provider Notes (Signed)
Medical screening examination/treatment/procedure(s) were conducted as a shared visit with non-physician practitioner(s) and myself.  I personally evaluated the patient during the encounter 70 year old female, with a history of cholecystectomy, presents with about a 6 month history of nausea followed by 3 weeks with vomiting, along with the nausea.  Now.  She has abdominal pain, as well.  She had been seen by Dr. Jesusita Oka him in the past.  For this however, no etiology for her symptoms have been determined.  On examination.  She is uncomfortable appearing, with epigastric tenderness without peritoneal signs.  We will treat her symptoms.  Perform laboratory testing, and a CAT scan for further evaluation.  Nicholes Stairs, MD 06/12/11 1517

## 2011-06-12 NOTE — ED Provider Notes (Signed)
History     CSN: [id number] Arrival date & time: 06/12/2011 10:58 AM   First MD Initiated Contact with Patient 06/12/11 1138      Chief Complaint  Patient presents with  . Nausea    (Consider location/radiation/quality/duration/timing/severity/associated sxs/prior treatment) HPI  Past Medical History  Diagnosis Date  . HTN (hypertension)   . Ascending aortic aneurysm   . Dyslipidemia   . Tobacco dependence   . Hypothyroidism   . Depression   . Neurogenic bladder   . Obstructive sleep apnea   . Obesity   . Dog bite     RLL with systemic inflammatory response  . Chronic low back pain   . Anxiety   . MS (multiple sclerosis)     Followed by Dr. Sandria Manly  . High cholesterol   . Aortic aneurysm     Past Surgical History  Procedure Date  . Cholecystectomy   . Hysterectomy   . Nasal sinus surgery   . Left eye surgery   . Bilateral foot surgery     No family history on file.  History  Substance Use Topics  . Smoking status: Current Everyday Smoker  . Smokeless tobacco: Never Used  . Alcohol Use: Yes     Rare alcohol use    OB History    Grav Para Term Preterm Abortions TAB SAB Ect Mult Living                  Review of Systems  Allergies  Codeine; Demerol; and Penicillins  Home Medications   Current Outpatient Rx  Name Route Sig Dispense Refill  . ASPIRIN 81 MG PO CHEW Oral Chew 81 mg by mouth daily.      . ATORVASTATIN CALCIUM 40 MG PO TABS Oral Take 40 mg by mouth at bedtime.     Marland Kitchen CALCIUM + D PO Oral Take 1 tablet by mouth daily.     Marland Kitchen CLONAZEPAM 1 MG PO TABS Oral Take 1 mg by mouth daily.     Marland Kitchen DOXYCYCLINE HYCLATE 100 MG PO TABS Oral Take 1 tablet (100 mg total) by mouth 2 (two) times daily. 28 tablet 0  . ESOMEPRAZOLE MAGNESIUM 20 MG PO CPDR Oral Take 20 mg by mouth daily before breakfast.      . FAMOTIDINE 40 MG PO TABS Oral Take 0.5 tablets (20 mg total) by mouth 2 (two) times daily as needed (upset stomach). 12 tablet 0  . FLUOXETINE HCL 40 MG  PO CAPS Oral Take 40 mg by mouth daily.      Marland Kitchen LEVOTHYROXINE SODIUM 200 MCG PO TABS  TAKE ONE TABLET DAILY 30 tablet 5  . LISINOPRIL 20 MG PO TABS Oral Take 20 mg by mouth daily.      . NEBIVOLOL HCL 10 MG PO TABS Oral Take 10 mg by mouth daily.      Marland Kitchen ONDANSETRON HCL 8 MG PO TABS Oral Take 1 tablet (8 mg total) by mouth every 8 (eight) hours as needed for nausea. 12 tablet 0  . TIOTROPIUM BROMIDE MONOHYDRATE 18 MCG IN CAPS Inhalation Place 18 mcg into inhaler and inhale daily as needed. For shortness of breath     . TIZANIDINE HCL 4 MG PO TABS Oral Take 8 mg by mouth at bedtime.        BP 148/79  Pulse 56  Temp(Src) 98.4 F (36.9 C) (Oral)  Resp 16  SpO2 96%  Physical Exam  ED Course  Procedures (including critical care time)  Labs Reviewed  CBC - Abnormal; Notable for the following:    WBC 11.5 (*)    RBC 5.40 (*)    Hemoglobin 16.1 (*)    HCT 47.6 (*)    All other components within normal limits  DIFFERENTIAL - Abnormal; Notable for the following:    Neutro Abs 8.4 (*)    All other components within normal limits  COMPREHENSIVE METABOLIC PANEL - Abnormal; Notable for the following:    Glucose, Bld 103 (*)    GFR calc non Af Amer 67 (*)    GFR calc Af Amer 78 (*)    All other components within normal limits  POCT I-STAT, CHEM 8 - Abnormal; Notable for the following:    Glucose, Bld 100 (*)    Hemoglobin 17.0 (*)    HCT 50.0 (*)    All other components within normal limits  LIPASE, BLOOD  URINALYSIS, ROUTINE W REFLEX MICROSCOPIC  I-STAT, CHEM 8   No results found.   No diagnosis found.    MDM  1300 12/19 Report received from [place] patient coming to CDU awaiting CT of the abdomen for chronic nausea and vomiting with upper abdominal pain.   1600: report given to [facility].  Awaiting results for CT of the abdomen with bilateral upper abdominal pain nausea and vomiting.  Will follow up with GI if CT ok.          Jethro Bastos, NP 06/12/11 1720  Jethro Bastos, NP 06/12/11 [phone]

## 2011-06-12 NOTE — ED Notes (Signed)
Awaiting ct scan. States sx x 3 weeks. Seen in dept 5 days ago for same sx. Pt currently drinking oral contrast.

## 2011-06-12 NOTE — ED Notes (Signed)
Patient transported to CT 

## 2011-06-15 NOTE — ED Provider Notes (Signed)
Medical screening examination/treatment/procedure(s) were performed by non-physician practitioner and as supervising physician I was immediately available for consultation/collaboration.  Nicholes Stairs, MD 06/15/11 [phone]

## 2011-06-15 NOTE — ED Provider Notes (Signed)
Medical screening examination/treatment/procedure(s) were conducted as a shared visit with non-physician practitioner(s) and myself.  I personally evaluated the patient during the encounter  Nicholes Stairs, MD 06/15/11 [phone]

## 2011-06-15 NOTE — ED Provider Notes (Signed)
Medical screening examination/treatment/procedure(s) were conducted as a shared visit with non-physician practitioner(s) and myself.  I personally evaluated the patient during the encounter  Celeste Candelas P Miller Limehouse, MD 06/15/11 0710 

## 2011-07-30 ENCOUNTER — Encounter: Payer: Self-pay | Admitting: Internal Medicine

## 2011-07-30 ENCOUNTER — Ambulatory Visit (INDEPENDENT_AMBULATORY_CARE_PROVIDER_SITE_OTHER): Payer: Medicare Other | Admitting: Internal Medicine

## 2011-07-30 VITALS — BP 110/60 | HR 64 | Ht 64.0 in | Wt 238.6 lb

## 2011-07-30 DIAGNOSIS — J42 Unspecified chronic bronchitis: Secondary | ICD-10-CM

## 2011-07-30 DIAGNOSIS — G4700 Insomnia, unspecified: Secondary | ICD-10-CM

## 2011-07-30 DIAGNOSIS — J309 Allergic rhinitis, unspecified: Secondary | ICD-10-CM

## 2011-07-30 DIAGNOSIS — G2581 Restless legs syndrome: Secondary | ICD-10-CM

## 2011-07-30 DIAGNOSIS — G4733 Obstructive sleep apnea (adult) (pediatric): Secondary | ICD-10-CM

## 2011-07-30 DIAGNOSIS — F17200 Nicotine dependence, unspecified, uncomplicated: Secondary | ICD-10-CM

## 2011-07-30 NOTE — Patient Instructions (Signed)
Samples x 5 lunesta 3 mg, 1 at bedtime for sleep if needed  Order- ONOX  On room air  Without CPAP-    Dx OSA  Order- [facility]- refer to Dr Althea Grimmer, DDS     Oral appliance for OSA, intolerant of CPAP

## 2011-07-30 NOTE — Progress Notes (Signed)
07/30/11- 70 yoFsmoker followed for OSA/ insomnia, PLMS, bronchitis, complicated by tobacco abuse LOV- 08/06/10  Dr Cain Saupe PCP Comes for one year followup. Unfortunately she is still smoking. She says neurologist Dr. Sandria Manly confirmed diagnosis of MS originally made 30 years ago. She says she she often goes one or 2 nights in a row with little sleep. Intolerant of CPAP now, although she went continuously in the beginning. Blames it for causing sinus infections despite her humidifier. Complains she feels unrested. She has a history of restless leg syndrome/PLMS there is blaming MS for her leg jerks. Failed trials of Mirapex, Zanaflex and Klonopin. Has noted shortness of breath and rapid heartbeat at times, mainly if she wakes gasping. It sounds as if she may have had some reflux events but more often is waking with apnea for it she does not wear her CPAP. NPSG 07/22/06- AHI 25.2/hr CXR 06/06/11- chronic bronchitis PFT-06/06/10- mild obstructive airways disease w/ small response to bronchodilator  FEV1/FVC 0.80.  ROS-see HPI Constitutional:   No-   weight loss, night sweats, fevers, chills,   +fatigue, lassitude. HEENT:   No-  headaches, difficulty swallowing, tooth/dental problems, sore throat,       No-  sneezing, itching, ear ache,   +nasal congestion, post nasal drip,  CV:  + chest pain, orthopnea, PND, swelling in lower extremities, anasarca,  dizziness, +palpitations Resp: No-   shortness of breath with exertion or at rest.              No-   productive cough,  + non-productive cough,  No- coughing up of blood.              No-   change in color of mucus.  Some wheezing.   Skin: No-   rash or lesions. GI:  No-   heartburn, indigestion, abdominal pain, nausea, vomiting, diarrhea,                 change in bowel habits, loss of appetite GU: No-   dysuria,. MS:  +  joint pain or swelling.  No- decreased range of motion.  No- back pain. Neuro-     nothing unusual Psych:  No- change in mood or  affect. + depression or anxiety.  No memory loss.   OBJ- Physical Exam General- Alert, Oriented, Affect-appropriate, Distress- none acute, overweight Skin- rash-none, lesions- none, excoriation- none Lymphadenopathy- none Head- atraumatic            Eyes- Gross vision intact, PERRLA, conjunctivae and secretions clear            Ears- Hearing, canals-normal            Nose- Clear, no-Septal dev, mucus, polyps, erosion, perforation             Throat- Mallampati II , mucosa red , drainage- none, tonsils- atrophic Neck- flexible , trachea midline, no stridor , thyroid nl, carotid no bruit Chest - symmetrical excursion , unlabored           Heart/CV- RRR , no murmur , no gallop  , no rub, nl s1 s2                           - JVD- none , edema- none, stasis changes- none, varices- none           Lung- clear to P&A, wheeze- none, cough- none , dullness-none, rub- none  Chest wall-  Abd- Br/ Gen/ Rectal- Not done, not indicated Extrem- cyanosis- none, clubbing, none, atrophy- none, strength- nl Neuro- grossly intact to observation

## 2011-07-31 ENCOUNTER — Encounter: Payer: Self-pay | Admitting: Internal Medicine

## 2011-07-31 NOTE — Assessment & Plan Note (Signed)
Her sleep study of 2008 head indicated leg movement. She blames her MS and has tried several appropriate medications. I don't have a witness to the clarify how much of an issue this really seems. She describes her sleep problems as more of a sustained busy brain and inability to sustain sleep, rather than repeated waking.

## 2011-07-31 NOTE — Assessment & Plan Note (Signed)
Multifactorial difficulty initiating and maintaining sleep. We discussed sleep hygiene and her response to several previous medications. She has been educated on cognitive behavioral therapy techniques. Plan-sample Lunesta 2 mg

## 2011-07-31 NOTE — Assessment & Plan Note (Addendum)
She is not describing significant cough or wheeze. Dyspnea or flex obesity, deconditioning, and lung disease. Cardiac disease has not been identified although imaging has previously shown in a sending aortic aneurysm. Plan overnight oximetry on room air, smoking cessation.

## 2011-07-31 NOTE — Assessment & Plan Note (Signed)
Although pulmonary function was fairly well preserved when last checked, this will catch up with her. I talked again today about available support and cessation techniques and encouraged her to commit to a real cessation efforts. She is not motivated.

## 2011-07-31 NOTE — Assessment & Plan Note (Signed)
She is describing dry stuffiness without sneeze itch or significant drainage. This has interfered with use of CPAP. She is not describing headache or purulent mucus. Plan-saline nasal rinse.

## 2011-08-15 ENCOUNTER — Encounter: Payer: Self-pay | Admitting: Internal Medicine

## 2011-10-23 ENCOUNTER — Emergency Department (HOSPITAL_COMMUNITY)
Admission: EM | Admit: 2011-10-23 | Discharge: 2011-10-23 | Disposition: A | Discharge status: Home / Self Care | Payer: Medicare Other | Source: Home / Self Care | Attending: Family Medicine | Admitting: Family Medicine

## 2011-10-23 ENCOUNTER — Emergency Department (INDEPENDENT_AMBULATORY_CARE_PROVIDER_SITE_OTHER): Payer: Medicare Other

## 2011-10-23 ENCOUNTER — Encounter (HOSPITAL_COMMUNITY): Payer: Self-pay | Admitting: Emergency Medicine

## 2011-10-23 DIAGNOSIS — S91331A Puncture wound without foreign body, right foot, initial encounter: Secondary | ICD-10-CM

## 2011-10-23 DIAGNOSIS — IMO0002 Reserved for concepts with insufficient information to code with codable children: Secondary | ICD-10-CM

## 2011-10-23 DIAGNOSIS — S91309A Unspecified open wound, unspecified foot, initial encounter: Secondary | ICD-10-CM

## 2011-10-23 MED ORDER — TETANUS-DIPHTH-ACELL PERTUSSIS 5-2.5-18.5 LF-MCG/0.5 IM SUSP
0.5000 mL | Freq: Once | INTRAMUSCULAR | Status: AC
  Administered 2011-10-23: 0.5 mL via INTRAMUSCULAR

## 2011-10-23 MED ORDER — TETANUS-DIPHTH-ACELL PERTUSSIS 5-2.5-18.5 LF-MCG/0.5 IM SUSP
INTRAMUSCULAR | Status: AC
  Filled 2011-10-23: qty 0.5

## 2011-10-23 NOTE — ED Notes (Signed)
Patient remains in xray 

## 2011-10-23 NOTE — Discharge Instructions (Signed)
Soak in warm epsom salts water nightly for 5 nights, return if further problems.

## 2011-10-23 NOTE — ED Notes (Signed)
Needs tdap, reports dr Waynard Edwards suggested she come to [facility] for tdap

## 2011-10-23 NOTE — ED Notes (Signed)
Right foot pain, onset Saturday.  Walking barefoot in house on carpet, stepped on something that made a pop sound.   Patient suspects sliver of old dry wood.  Daughter attempted to remove foreign body with tweezers and piece broke off.

## 2011-10-23 NOTE — ED Provider Notes (Signed)
History     CSN: [id number]  Arrival date & time 10/23/11  1602   First MD Initiated Contact with Patient 10/23/11 1627      Chief Complaint  Patient presents with  . Foot Pain    (Consider location/radiation/quality/duration/timing/severity/associated sxs/prior treatment) Patient is a 71 y.o. female presenting with lower extremity pain. The history is provided by the patient.  Foot Pain This is a new problem. The current episode started more than 2 days ago (walking barefoot in house on sat and stepped on unknown fb, on right foot, continues sore.). The problem has not changed since onset.The symptoms are aggravated by walking.    Past Medical History  Diagnosis Date  . HTN (hypertension)   . Ascending aortic aneurysm   . Dyslipidemia   . Tobacco dependence   . Hypothyroidism   . Depression   . Neurogenic bladder   . Obstructive sleep apnea   . Obesity   . Dog bite     RLL with systemic inflammatory response  . Chronic low back pain   . Anxiety   . MS (multiple sclerosis)     Followed by Dr. Sandria Manly  . High cholesterol   . Aortic aneurysm     Past Surgical History  Procedure Date  . Cholecystectomy   . Hysterectomy   . Nasal sinus surgery   . Left eye surgery   . Bilateral foot surgery     No family history on file.  History  Substance Use Topics  . Smoking status: Current Everyday Smoker -- 0.5 packs/day for 30 years    Types: Cigarettes  . Smokeless tobacco: Never Used  . Alcohol Use: Yes     Rare alcohol use    OB History    Grav Para Term Preterm Abortions TAB SAB Ect Mult Living                  Review of Systems  Constitutional: Negative.   Skin: Positive for wound.    Allergies  Codeine; Demerol; Lisinopril; and Penicillins  Home Medications   Current Outpatient Rx  Name Route Sig Dispense Refill  . ASPIRIN 81 MG PO CHEW Oral Chew 81 mg by mouth daily.      . ATORVASTATIN CALCIUM 40 MG PO TABS Oral Take 40 mg by mouth at bedtime.       Marland Kitchen CALCIUM + D PO Oral Take 1 tablet by mouth daily.     Marland Kitchen CLONAZEPAM 1 MG PO TABS Oral Take 1 mg by mouth daily.     Marland Kitchen ESOMEPRAZOLE MAGNESIUM 20 MG PO CPDR Oral Take 20 mg by mouth daily before breakfast.      . FAMOTIDINE 40 MG PO TABS Oral Take 0.5 tablets (20 mg total) by mouth 2 (two) times daily as needed (upset stomach). 12 tablet 0  . FLUOXETINE HCL 40 MG PO CAPS Oral Take 40 mg by mouth daily.      Marland Kitchen LEVOTHYROXINE SODIUM 200 MCG PO TABS  TAKE ONE TABLET DAILY 30 tablet 5  . NEBIVOLOL HCL 10 MG PO TABS Oral Take 10 mg by mouth daily.      Marland Kitchen TIOTROPIUM BROMIDE MONOHYDRATE 18 MCG IN CAPS Inhalation Place 18 mcg into inhaler and inhale daily as needed. For shortness of breath     . TIZANIDINE HCL 4 MG PO TABS Oral Take 8 mg by mouth at bedtime.        BP 126/80  Pulse 95  Temp(Src) 98.6 F (37 C) (  Oral)  Resp 20  SpO2 97%  Physical Exam  Nursing note and vitals reviewed. Constitutional: She appears well-developed and well-nourished.  Musculoskeletal: She exhibits tenderness.       Feet:    ED Course  Procedures (including critical care time)  Labs Reviewed - No data to display Dg Foot Complete Right  10/23/2011  *RADIOLOGY REPORT*  Clinical Data: Pain  RIGHT FOOT COMPLETE - 3+ VIEW  Comparison: 12/30/2004  Findings: Three views of the right foot submitted. There is nondisplaced fracture of distal tibia medial malleolus.  Again noted status post fusion of medial cuneiform with first and second metatarsal.  Plantar spur of the calcaneus is noted.  IMPRESSION: Nondisplaced fracture distal right tibia medial malleolus.  Postsurgical changes medial  cuneiform  , first and second metatarsal.  Plantar spur of the calcaneus.  Original Report Authenticated By: Natasha Mead, M.D.     1. Puncture wound of right foot without foreign body, initial encounter       MDM  X-rays reviewed and report per radiologist. Superficial debridement of overlying skin failed to reveal fb., no drainage  expressed.         Linna Hoff, MD 10/23/11 1729

## 2011-10-28 ENCOUNTER — Encounter: Payer: Self-pay | Admitting: Internal Medicine

## 2011-10-28 ENCOUNTER — Ambulatory Visit (INDEPENDENT_AMBULATORY_CARE_PROVIDER_SITE_OTHER): Payer: Medicare Other | Admitting: Internal Medicine

## 2011-10-28 VITALS — BP 122/80 | HR 84 | Ht 64.0 in | Wt 242.0 lb

## 2011-10-28 DIAGNOSIS — J309 Allergic rhinitis, unspecified: Secondary | ICD-10-CM

## 2011-10-28 DIAGNOSIS — G4733 Obstructive sleep apnea (adult) (pediatric): Secondary | ICD-10-CM

## 2011-10-28 DIAGNOSIS — F17200 Nicotine dependence, unspecified, uncomplicated: Secondary | ICD-10-CM

## 2011-10-28 NOTE — Patient Instructions (Signed)
Please keep trying to stop smoking. You know it isn't good for you.  Get back with Dr Myrtis Ser to make the mouth piece comfortable.

## 2011-10-28 NOTE — Progress Notes (Signed)
07/30/11- 70 yoFsmoker followed for OSA/ insomnia, PLMS, bronchitis, complicated by tobacco abuse LOV- 08/06/10  Dr Cain Saupe PCP Comes for one year followup. Unfortunately she is still smoking. She says neurologist Dr. Sandria Manly confirmed diagnosis of MS originally made 30 years ago. She says she she often goes one or 2 nights in a row with little sleep. Intolerant of CPAP now, although she went continuously in the beginning. Blames it for causing sinus infections despite her humidifier. Complains she feels unrested. She has a history of restless leg syndrome/PLMS there is blaming MS for her leg jerks. Failed trials of Mirapex, Zanaflex and Klonopin. Has noted shortness of breath and rapid heartbeat at times, mainly if she wakes gasping. It sounds as if she may have had some reflux events but more often is waking with apnea for it she does not wear her CPAP. NPSG 07/22/06- AHI 25.2/hr CXR 06/06/11- chronic bronchitis PFT-06/06/10- mild obstructive airways disease w/ small response to bronchodilator  FEV1/FVC 0.80.  10/28/11- 70 yoFsmoker followed for OSA/ insomnia, PLMS, bronchitis, complicated by tobacco abuse   Dr Cain Saupe PCP Wears mouthpiece made by Dr Anastasio Champion use CPAP at all;  Slight (very little) cough, congestion, SOB , or wheezing Mouthpiece hurts her mouth some and she is going back for adjustment. Spring pollen season has been good this year, well controlled. Overnight oximetry to 13 2013-oxygen desaturation below 88% for almost 60 minutes. She is not on oxygen because she was using sleep apnea treatment and we are still hoping that control of sleep apnea will control her oxygen.  ROS-see HPI Constitutional:   No-   weight loss, night sweats, fevers, chills,   +fatigue, lassitude. HEENT:   No-  headaches, difficulty swallowing, tooth/dental problems, sore throat,       No-  sneezing, itching, ear ache,   +nasal congestion, post nasal drip,  CV:  + chest pain, orthopnea, PND, swelling in  lower extremities, anasarca,  dizziness, +palpitations Resp: No-   shortness of breath with exertion or at rest.              No-   productive cough,  + non-productive cough,  No- coughing up of blood.              No-   change in color of mucus.  Some wheezing.   Skin: No-   rash or lesions. GI:  No-   heartburn, indigestion, abdominal pain, nausea, vomiting,  GU: No-   dysuria,. MS:  +  joint pain or swelling.  . Neuro-     nothing unusual Psych:  No- change in mood or affect. + depression or anxiety.  No memory loss.   OBJ- Physical Exam General- Alert, Oriented, Affect-appropriate, Distress- none acute, overweight Skin- rash-none, lesions- none, excoriation- none Lymphadenopathy- none Head- atraumatic            Eyes- Gross vision intact, PERRLA, conjunctivae and secretions clear            Ears- Hearing, canals-normal            Nose- Clear, no-Septal dev, mucus, polyps, erosion, perforation             Throat- Mallampati IV , mucosa red , drainage- none, tonsils- atrophic Neck- flexible , trachea midline, no stridor , thyroid nl, carotid no bruit Chest - symmetrical excursion , unlabored           Heart/CV- RRR , no murmur , no gallop  , no rub, nl  s1 s2                           - JVD- none , edema- none, stasis changes- none, varices- none           Lung- clear to P&A, wheeze- none, cough- none , dullness-none, rub- none           Chest wall-  Abd- Br/ Gen/ Rectal- Not done, not indicated Extrem- cyanosis- none, clubbing, none, atrophy- none, strength- nl Neuro- grossly intact to observation

## 2011-10-31 NOTE — Assessment & Plan Note (Signed)
She is using the oral appliance but there is room for improved fit and comfort. She will follow up on that. We also discussed the importance of weight loss, driving responsibility and controlling her nasal congestion.

## 2011-10-31 NOTE — Assessment & Plan Note (Signed)
Good control so far this spring.

## 2011-10-31 NOTE — Assessment & Plan Note (Signed)
She seems very passive. I am trying to motivate her enough to make an effort at smoking cessation.

## 2012-02-20 ENCOUNTER — Emergency Department (HOSPITAL_COMMUNITY)
Admission: EM | Admit: 2012-02-20 | Discharge: 2012-02-20 | Disposition: A | Discharge status: Home / Self Care | Payer: Medicare Other | Attending: Emergency Medicine | Admitting: Emergency Medicine

## 2012-02-20 ENCOUNTER — Encounter (HOSPITAL_COMMUNITY): Payer: Self-pay | Admitting: Family Medicine

## 2012-02-20 DIAGNOSIS — F17200 Nicotine dependence, unspecified, uncomplicated: Secondary | ICD-10-CM | POA: Insufficient documentation

## 2012-02-20 DIAGNOSIS — F329 Major depressive disorder, single episode, unspecified: Secondary | ICD-10-CM | POA: Insufficient documentation

## 2012-02-20 DIAGNOSIS — G35 Multiple sclerosis: Secondary | ICD-10-CM | POA: Insufficient documentation

## 2012-02-20 DIAGNOSIS — G4733 Obstructive sleep apnea (adult) (pediatric): Secondary | ICD-10-CM | POA: Insufficient documentation

## 2012-02-20 DIAGNOSIS — E039 Hypothyroidism, unspecified: Secondary | ICD-10-CM | POA: Insufficient documentation

## 2012-02-20 DIAGNOSIS — T1590XA Foreign body on external eye, part unspecified, unspecified eye, initial encounter: Secondary | ICD-10-CM | POA: Insufficient documentation

## 2012-02-20 DIAGNOSIS — Z880 Allergy status to penicillin: Secondary | ICD-10-CM | POA: Insufficient documentation

## 2012-02-20 DIAGNOSIS — F411 Generalized anxiety disorder: Secondary | ICD-10-CM | POA: Insufficient documentation

## 2012-02-20 DIAGNOSIS — Z886 Allergy status to analgesic agent status: Secondary | ICD-10-CM | POA: Insufficient documentation

## 2012-02-20 DIAGNOSIS — E7800 Pure hypercholesterolemia, unspecified: Secondary | ICD-10-CM | POA: Insufficient documentation

## 2012-02-20 DIAGNOSIS — H579 Unspecified disorder of eye and adnexa: Secondary | ICD-10-CM

## 2012-02-20 DIAGNOSIS — I10 Essential (primary) hypertension: Secondary | ICD-10-CM | POA: Insufficient documentation

## 2012-02-20 DIAGNOSIS — E785 Hyperlipidemia, unspecified: Secondary | ICD-10-CM | POA: Insufficient documentation

## 2012-02-20 DIAGNOSIS — F3289 Other specified depressive episodes: Secondary | ICD-10-CM | POA: Insufficient documentation

## 2012-02-20 DIAGNOSIS — H5710 Ocular pain, unspecified eye: Secondary | ICD-10-CM

## 2012-02-20 MED ORDER — FLUORESCEIN SODIUM 1 MG OP STRP
1.0000 | ORAL_STRIP | Freq: Once | OPHTHALMIC | Status: AC
  Administered 2012-02-20: 1 via OPHTHALMIC

## 2012-02-20 MED ORDER — ERYTHROMYCIN 5 MG/GM OP OINT: TOPICAL_OINTMENT | Freq: Four times a day (QID) | OPHTHALMIC | Status: AC

## 2012-02-20 MED ORDER — FLUORESCEIN-BENOXINATE 0.25-0.4 % OP SOLN: 1.0000 [drp] | Freq: Once | OPHTHALMIC | Status: DC

## 2012-02-20 MED ORDER — TETRACAINE HCL 0.5 % OP SOLN
1.0000 [drp] | Freq: Once | OPHTHALMIC | Status: AC
  Administered 2012-02-20: 1 [drp] via OPHTHALMIC

## 2012-02-20 NOTE — ED Notes (Signed)
Rx given x1 Pt ambulating independently w/ steady gait on d/c in no acute distress, A&Ox4. D/c instructions reviewed w/ pt - pt denies any further questions or concerns at present.   

## 2012-02-20 NOTE — ED Notes (Signed)
Patient states that she woke up yesterday morning feeling like she had something in her eye and tried to rinse it out and used eye drops without relief. States pain remains today and had mucus in her eye.

## 2012-02-20 NOTE — ED Notes (Signed)
Pt w/ rt eye irritation that began yesterday a.m. - redness noted to rt eye.

## 2012-02-20 NOTE — ED Provider Notes (Signed)
Medical screening examination/treatment/procedure(s) were conducted as a shared visit with non-physician practitioner(s) and myself.  I personally evaluated the patient during the encounter She presented w FB sensation, no acuity changes.  On my exam, following removal of eyelash, she was better subjectively, noted near total resolution of her Sx.  Absent contact use, she received topical treatment, and was d/c to f/u w her ophthalmologist.    Gerhard Munch, MD 02/20/12 [phone]

## 2012-02-20 NOTE — ED Provider Notes (Signed)
History     CSN: [id number]  Arrival date & time 02/20/12  4098   First MD Initiated Contact with Patient 02/20/12 [phone]      Chief Complaint  Patient presents with  . Eye Pain    (Consider location/radiation/quality/duration/timing/severity/associated sxs/prior treatment) HPI Comments: 71 y/o female presents with scratchy feeling in her right eye beginning yesterday feeling as if there is an eyelash stuck in there. Tried using eye drops to get it out without success. States it is painful so she has been rubbing her eye causing her eye to water. Admits to photophobia. Denies any blurred or double vision, congestion, fever, chills.   The history is provided by the patient.    Past Medical History  Diagnosis Date  . HTN (hypertension)   . Ascending aortic aneurysm   . Dyslipidemia   . Tobacco dependence   . Hypothyroidism   . Depression   . Neurogenic bladder   . Obstructive sleep apnea   . Obesity   . Dog bite     RLL with systemic inflammatory response  . Chronic low back pain   . Anxiety   . MS (multiple sclerosis)     Followed by Dr. Sandria Manly  . High cholesterol   . Aortic aneurysm     Past Surgical History  Procedure Date  . Cholecystectomy   . Hysterectomy   . Nasal sinus surgery   . Left eye surgery   . Bilateral foot surgery     No family history on file.  History  Substance Use Topics  . Smoking status: Current Everyday Smoker -- 0.5 packs/day for 30 years    Types: Cigarettes  . Smokeless tobacco: Never Used  . Alcohol Use: Yes     Rare alcohol use    OB History    Grav Para Term Preterm Abortions TAB SAB Ect Mult Living                  Review of Systems  Constitutional: Negative for fever and chills.  Eyes: Positive for photophobia. Negative for visual disturbance.    Allergies  Codeine; Demerol; Lisinopril; and Penicillins  Home Medications   Current Outpatient Rx  Name Route Sig Dispense Refill  . ASPIRIN 81 MG PO CHEW Oral Chew 81  mg by mouth daily.      . ATORVASTATIN CALCIUM 40 MG PO TABS Oral Take 40 mg by mouth at bedtime.     Marland Kitchen CALCIUM + D PO Oral Take 1 tablet by mouth daily.     Marland Kitchen CLONAZEPAM 1 MG PO TABS Oral Take 1 mg by mouth daily.     Marland Kitchen FLUOXETINE HCL 40 MG PO CAPS Oral Take 40 mg by mouth daily.      Marland Kitchen LEVOTHYROXINE SODIUM 200 MCG PO TABS  TAKE ONE TABLET DAILY 30 tablet 5  . LOSARTAN POTASSIUM 100 MG PO TABS Oral Take 1 tablet by mouth daily.    . NEBIVOLOL HCL 10 MG PO TABS Oral Take 10 mg by mouth daily.      Marland Kitchen TIOTROPIUM BROMIDE MONOHYDRATE 18 MCG IN CAPS Inhalation Place 18 mcg into inhaler and inhale daily as needed. For shortness of breath     . TIZANIDINE HCL 4 MG PO TABS Oral Take 8 mg by mouth at bedtime.        BP 146/88  Pulse 80  Temp 98.7 F (37.1 C) (Oral)  Resp 16  SpO2 94%  Physical Exam  Nursing note and vitals reviewed.  Constitutional: She is oriented to person, place, and time. She appears well-developed and well-nourished. No distress.  HENT:  Head: Normocephalic and atraumatic.  Nose: Nose normal.  Mouth/Throat: Oropharynx is clear and moist.  Eyes: EOM are normal. Pupils are equal, round, and reactive to light. No foreign bodies found. Right eye exhibits discharge (clear watery). Right eye exhibits no exudate. No foreign body present in the right eye. Right conjunctiva is injected. Left conjunctiva is not injected.  Slit lamp exam:      The right eye shows no corneal abrasion and no foreign body.       Upon tetracaine drop administration, eye lash fell out of right eye. Eye stained- no abrasion or lesion.  Neck: Normal range of motion. Neck supple.  Cardiovascular: Normal rate, regular rhythm and normal heart sounds.   Pulmonary/Chest: Effort normal and breath sounds normal.  Neurological: She is alert and oriented to person, place, and time.  Skin: Skin is warm and dry. No rash noted.  Psychiatric: She has a normal mood and affect. Her behavior is normal.    ED Course    Procedures (including critical care time)  Labs Reviewed - No data to display No results found.   1. Eye pain   2. Sensation of foreign body in eye       MDM  71 y/o female with foreign body sensation in right eye. Eye lash expressed with administration of tetracaine drops. Eye stained- no evidence of foreign body, abrasion, lesion. Advised f/u with her eye Dr. If her symptoms return.        Trevor Mace, PA-C 02/20/12 [phone]

## 2012-04-30 ENCOUNTER — Ambulatory Visit: Payer: Medicare Other | Admitting: Internal Medicine

## 2012-07-16 ENCOUNTER — Other Ambulatory Visit: Payer: Self-pay | Admitting: Internal Medicine

## 2012-07-16 DIAGNOSIS — R109 Unspecified abdominal pain: Secondary | ICD-10-CM

## 2012-07-16 DIAGNOSIS — I712 Thoracic aortic aneurysm, without rupture: Secondary | ICD-10-CM

## 2012-07-17 ENCOUNTER — Ambulatory Visit
Admission: RE | Admit: 2012-07-17 | Discharge: 2012-07-17 | Disposition: A | Discharge status: Home / Self Care | Payer: Medicare Other | Source: Ambulatory Visit | Attending: Internal Medicine | Admitting: Internal Medicine

## 2012-07-17 DIAGNOSIS — R109 Unspecified abdominal pain: Secondary | ICD-10-CM

## 2012-07-17 DIAGNOSIS — I712 Thoracic aortic aneurysm, without rupture: Secondary | ICD-10-CM

## 2012-07-17 MED ORDER — IOHEXOL 300 MG/ML  SOLN
125.0000 mL | Freq: Once | INTRAMUSCULAR | Status: AC | PRN
  Administered 2012-07-17: 125 mL via INTRAVENOUS

## 2012-10-07 ENCOUNTER — Encounter: Payer: Self-pay | Admitting: Internal Medicine

## 2012-10-30 ENCOUNTER — Encounter: Payer: Self-pay | Admitting: Internal Medicine

## 2012-11-03 ENCOUNTER — Ambulatory Visit: Payer: Medicare Other | Admitting: Internal Medicine

## 2013-08-23 ENCOUNTER — Emergency Department (HOSPITAL_COMMUNITY)
Admission: EM | Admit: 2013-08-23 | Discharge: 2013-08-23 | Disposition: A | Discharge status: Home / Self Care | Payer: Medicare Other | Source: Home / Self Care | Attending: Family Medicine | Admitting: Family Medicine

## 2013-08-23 ENCOUNTER — Emergency Department (INDEPENDENT_AMBULATORY_CARE_PROVIDER_SITE_OTHER): Payer: Medicare Other

## 2013-08-23 ENCOUNTER — Encounter (HOSPITAL_COMMUNITY): Payer: Self-pay | Admitting: Emergency Medicine

## 2013-08-23 DIAGNOSIS — S62639A Displaced fracture of distal phalanx of unspecified finger, initial encounter for closed fracture: Secondary | ICD-10-CM

## 2013-08-23 MED ORDER — TRAMADOL HCL 50 MG PO TABS: 50.0000 mg | ORAL_TABLET | Freq: Four times a day (QID) | ORAL | Status: DC | PRN

## 2013-08-23 NOTE — ED Provider Notes (Signed)
CSN: [id number]     Arrival date & time 08/23/13  1606 History   First MD Initiated Contact with Patient 08/23/13 1805     Chief Complaint  Patient presents with  . Finger Injury   (Consider location/radiation/quality/duration/timing/severity/associated sxs/prior Treatment) Patient is a 73 y.o. female presenting with hand pain. The history is provided by the patient.  Hand Pain This is a new problem. The current episode started 3 to 5 hours ago (closed tailgate of van on fingertip of lmf and door locked ). The problem has been gradually worsening.    Past Medical History  Diagnosis Date  . HTN (hypertension)   . Ascending aortic aneurysm   . Dyslipidemia   . Tobacco dependence   . Hypothyroidism   . Depression   . Neurogenic bladder   . Obstructive sleep apnea   . Obesity   . Dog bite(E906.0)     RLL with systemic inflammatory response  . Chronic low back pain   . Anxiety   . MS (multiple sclerosis)     Followed by Dr. Sandria Manly  . High cholesterol   . Aortic aneurysm    Past Surgical History  Procedure Laterality Date  . Cholecystectomy    . Hysterectomy    . Nasal sinus surgery    . Left eye surgery    . Bilateral foot surgery     History reviewed. No pertinent family history. History  Substance Use Topics  . Smoking status: Current Every Day Smoker -- 0.50 packs/day for 30 years    Types: Cigarettes  . Smokeless tobacco: Never Used  . Alcohol Use: Yes     Comment: Rare alcohol use   OB History   Grav Para Term Preterm Abortions TAB SAB Ect Mult Living                 Review of Systems  Constitutional: Negative.   Musculoskeletal: Positive for arthralgias.  Skin: Positive for wound.    Allergies  Codeine; Demerol; Lisinopril; and Penicillins  Home Medications   Current Outpatient Rx  Name  Route  Sig  Dispense  Refill  . aspirin 81 MG chewable tablet   Oral   Chew 81 mg by mouth daily.           Marland Kitchen atorvastatin (LIPITOR) 40 MG tablet   Oral  Take 40 mg by mouth at bedtime.          . Calcium Carbonate-Vitamin D (CALCIUM + D PO)   Oral   Take 1 tablet by mouth 2 (two) times daily.          . Cholecalciferol (VITAMIN D) 2000 UNITS tablet   Oral   Take 2,000 Units by mouth daily.         . clonazePAM (KLONOPIN) 1 MG tablet   Oral   Take 1 mg by mouth 3 (three) times daily as needed. Anxiety         . FLUoxetine (PROZAC) 40 MG capsule   Oral   Take 40 mg by mouth daily.           Marland Kitchen gabapentin (NEURONTIN) 100 MG capsule   Oral   Take 100-200 mg by mouth every 8 (eight) hours.         Marland Kitchen levothyroxine (SYNTHROID, LEVOTHROID) 200 MCG tablet      TAKE ONE TABLET DAILY   30 tablet   5   . levothyroxine (SYNTHROID, LEVOTHROID) 200 MCG tablet   Oral   Take 200-300 mcg  by mouth See admin instructions. Monday-Saturday pt takes 200mcg. On Sunday pt takes 300 mcg ( 1.5 tabs )         . losartan (COZAAR) 100 MG tablet   Oral   Take 1 tablet by mouth daily.         . nebivolol (BYSTOLIC) 10 MG tablet   Oral   Take 10 mg by mouth daily.           Marland Kitchen. EXPIRED: tiotropium (SPIRIVA) 18 MCG inhalation capsule   Inhalation   Place 18 mcg into inhaler and inhale daily as needed. For shortness of breath          . tiZANidine (ZANAFLEX) 4 MG tablet   Oral   Take 4 mg by mouth every 6 (six) hours as needed. MUSCLE SPASMS         . traMADol (ULTRAM) 50 MG tablet   Oral   Take 1 tablet (50 mg total) by mouth every 6 (six) hours as needed. For pain   15 tablet   0    BP 132/93  Pulse 84  Temp(Src) 98.1 F (36.7 C) (Oral)  Resp 18  SpO2 95% Physical Exam  Nursing note and vitals reviewed. Constitutional: She is oriented to person, place, and time. She appears well-developed and well-nourished. She appears distressed.  Musculoskeletal: She exhibits tenderness.  Tender distal phalanx of lmf with partial nail avulsion.  Neurological: She is alert and oriented to person, place, and time.  Skin: Skin is  warm and dry.    ED Course  Procedures (including critical care time) Labs Review Labs Reviewed - No data to display Imaging Review Dg Finger Middle Left  08/23/2013   CLINICAL DATA:  Middle finger injury.  EXAM: LEFT MIDDLE FINGER 2+V  COMPARISON:  None.  FINDINGS: Comminuted fracture of the distal phalanx identified. No other fracture is seen in the middle finger. IP joint spaces are largely preserved.  IMPRESSION: Comminuted fracture of the distal phalanx.   Electronically Signed   By: Kennith CenterEric  Mansell M.D.   On: 08/23/2013 19:11   X-rays reviewed and report per radiologist.   MDM   1. Fracture of finger, distal phalanx, left, closed        Linna HoffJames D Hildreth Orsak, MD 08/23/13 [phone]

## 2013-08-23 NOTE — ED Notes (Signed)
Crush injury to left middle finger earlier today in car door

## 2013-08-23 NOTE — Discharge Instructions (Signed)
Use ice and splint as needed until seen by dr Izora Ribascoley for recheck this week .

## 2014-03-27 NOTE — H&P (Signed)
TOTAL KNEE ADMISSION H&P  Patient is being admitted for left total knee arthroplasty.  Subjective:  Chief Complaint:     Left knee OA / pain.  HPI: [name] [name], 73 y.o. female, has a history of pain and functional disability in the left knee due to arthritis and has failed non-surgical conservative treatments for greater than 12 weeks to includeNSAID's and/or analgesics, corticosteriod injections, use of assistive devices and activity modification.  Onset of symptoms was gradual, starting ~ 30 years ago with gradually worsening course since that time. The patient noted no past surgery on the left knee(s).  Patient currently rates pain in the left knee(s) at 7 out of 10 with activity. Patient has night pain, worsening of pain with activity and weight bearing, pain that interferes with activities of daily living, pain with passive range of motion, crepitus and joint swelling.  Patient has evidence of periarticular osteophytes and joint space narrowing by imaging studies.  There is no active infection.  Risks, benefits and expectations were discussed with the patient.  Risks including but not limited to the risk of anesthesia, blood clots, nerve damage, blood vessel damage, failure of the prosthesis, infection and up to and including death.  Patient understand the risks, benefits and expectations and wishes to proceed with surgery.   PCP: Ezequiel KayserPERINI,MARK A, MD  D/C Plans:      SNF [facility]  Post-op Meds:       No Rx given  Tranexamic Acid:      To be given - IV    Decadron:      Is to be given  FYI:     ASA post-op  Oxycodone post-op  Nicotine patch 14mg     Patient Active Problem List   Diagnosis Date Noted  . Multiple somatic complaints 10/10/2010  . Dyspnea 10/10/2010  . Bradycardia 10/10/2010  . HTN (hypertension)   . Ascending aortic aneurysm   . Obesity   . GERD 07/02/2010  . HYPERLIPIDEMIA 05/29/2010  . HYPERTENSION 05/29/2010  . ALLERGIC RHINITIS 05/29/2010  . TOBACCO  ABUSE 05/25/2010  . OBSTRUCTIVE SLEEP APNEA 05/25/2010  . RESTLESS LEGS SYNDROME 05/25/2010  . BRONCHITIS, CHRONIC 05/25/2010  . DEGENERATIVE DISC DISEASE 05/25/2010  . INSOMNIA 05/25/2010   Past Medical History  Diagnosis Date  . HTN (hypertension)   . Ascending aortic aneurysm   . Dyslipidemia   . Tobacco dependence   . Hypothyroidism   . Depression   . Neurogenic bladder   . Obstructive sleep apnea   . Obesity   . Dog bite(E906.0)     RLL with systemic inflammatory response  . Chronic low back pain   . Anxiety   . MS (multiple sclerosis)     Followed by Dr. Sandria ManlyLove  . High cholesterol   . Aortic aneurysm     Past Surgical History  Procedure Laterality Date  . Cholecystectomy    . Hysterectomy    . Nasal sinus surgery    . Left eye surgery    . Bilateral foot surgery      No prescriptions prior to admission   Allergies  Allergen Reactions  . Codeine Other (See Comments)    Real bad chest pains  . Demerol Nausea Only  . Lisinopril Cough  . Penicillins Rash    Pt not sure "chestpains"    History  Substance Use Topics  . Smoking status: Current Every Day Smoker -- 0.50 packs/day for 30 years    Types: Cigarettes  . Smokeless tobacco: Never  Used  . Alcohol Use: Yes     Comment: Rare alcohol use    No family history on file.   Review of Systems  Constitutional: Negative.   Eyes: Negative.   Respiratory: Positive for shortness of breath (on exertion).   Cardiovascular: Negative.   Gastrointestinal: Positive for heartburn, diarrhea and constipation.  Genitourinary: Positive for urgency and frequency.  Musculoskeletal: Positive for back pain, joint pain, myalgias and neck pain.  Skin: Positive for itching and rash.  Neurological: Positive for headaches.  Endo/Heme/Allergies: Positive for environmental allergies.  Psychiatric/Behavioral: The patient has insomnia.     Objective:  Physical Exam  Constitutional: She is oriented to person, place, and time.  She appears well-developed and well-nourished.  HENT:  Head: Normocephalic and atraumatic.  Eyes: Pupils are equal, round, and reactive to light.  Neck: Neck supple. No JVD present. No tracheal deviation present. No thyromegaly present.  Cardiovascular: Normal rate, regular rhythm, normal heart sounds and intact distal pulses.   Respiratory: Effort normal and breath sounds normal. No respiratory distress. She has no wheezes.  GI: Soft. There is no tenderness. There is no guarding.  Musculoskeletal:       Left knee: She exhibits decreased range of motion, swelling and bony tenderness. She exhibits no ecchymosis, no deformity, no laceration and no erythema. Tenderness found.  Lymphadenopathy:    She has no cervical adenopathy.  Neurological: She is alert and oriented to person, place, and time. A sensory deficit (some numbness in her feet) is present.  Skin: Skin is warm and dry.  Psychiatric: She has a normal mood and affect.     Labs:  Estimated body mass index is 41.52 kg/(m^2) as calculated from the following:   Height as of 10/28/11: 5\' 4"  (1.626 m).   Weight as of 10/28/11: 109.77 kg (242 lb).   Imaging Review Plain radiographs demonstrate severe degenerative joint disease of the left knee(s). The overall alignment isneutral. The bone quality appears to be good for age and reported activity level.  Assessment/Plan:  End stage arthritis, left knee   The patient history, physical examination, clinical judgment of the provider and imaging studies are consistent with end stage degenerative joint disease of the left knee(s) and total knee arthroplasty is deemed medically necessary. The treatment options including medical management, injection therapy arthroscopy and arthroplasty were discussed at length. The risks and benefits of total knee arthroplasty were presented and reviewed. The risks due to aseptic loosening, infection, stiffness, patella tracking problems, thromboembolic  complications and other imponderables were discussed. The patient acknowledged the explanation, agreed to proceed with the plan and consent was signed. Patient is being admitted for inpatient treatment for surgery, pain control, PT, OT, prophylactic antibiotics, VTE prophylaxis, progressive ambulation and ADL's and discharge planning. The patient is planning to be discharged to skilled nursing facility.    [name] Auerbach [place]   PA-C  03/27/2014, 2:47 PM

## 2014-03-31 ENCOUNTER — Telehealth (HOSPITAL_COMMUNITY): Payer: Self-pay

## 2014-03-31 ENCOUNTER — Encounter: Payer: Self-pay | Admitting: Physician Assistant

## 2014-03-31 ENCOUNTER — Ambulatory Visit (INDEPENDENT_AMBULATORY_CARE_PROVIDER_SITE_OTHER): Payer: Medicare Other | Admitting: Physician Assistant

## 2014-03-31 VITALS — BP 142/96 | HR 81 | Ht 63.0 in | Wt 235.5 lb

## 2014-03-31 DIAGNOSIS — Z720 Tobacco use: Secondary | ICD-10-CM

## 2014-03-31 DIAGNOSIS — I10 Essential (primary) hypertension: Secondary | ICD-10-CM

## 2014-03-31 DIAGNOSIS — I7121 Aneurysm of the ascending aorta, without rupture: Secondary | ICD-10-CM

## 2014-03-31 DIAGNOSIS — G4733 Obstructive sleep apnea (adult) (pediatric): Secondary | ICD-10-CM

## 2014-03-31 DIAGNOSIS — I712 Thoracic aortic aneurysm, without rupture: Secondary | ICD-10-CM

## 2014-03-31 DIAGNOSIS — Z01818 Encounter for other preprocedural examination: Secondary | ICD-10-CM | POA: Insufficient documentation

## 2014-03-31 DIAGNOSIS — E669 Obesity, unspecified: Secondary | ICD-10-CM

## 2014-03-31 DIAGNOSIS — F17200 Nicotine dependence, unspecified, uncomplicated: Secondary | ICD-10-CM

## 2014-03-31 NOTE — Assessment & Plan Note (Signed)
Mrs. been very difficult due to limited mobility. He has chronic edema in her left knee and has to walk with a walker. Hopefully after knee surgery she will have improve mobility and be able to work on weight loss

## 2014-03-31 NOTE — Assessment & Plan Note (Signed)
Tobacco cessation discussed 

## 2014-03-31 NOTE — Progress Notes (Signed)
Date:  03/31/2014   ID:  [name] [name], DOB [birth year], MRN [id number]  PCP:  [name] Kayser, MD  Primary Cardiologist:  [name] [name]     History of Present Illness: [name] [name] is a 73 y.o. female with a history of obesity, bradycardia, descending aortic aneurysm, dyslipidemia, tobacco abuse, hypothyroidism, obstructive sleep apnea not using her CPAP, depression, hyperlipidemia. She's never had heart attack stents to her heart. She was last seen by [name] [name] on April 2012 for bradycardia. She has not followed up since. She presents today for preoperative clearance left knee surgery which is scheduled for October 19. She continues to smoke. Patient states that she feels average reason chronic pain left leg. She reports today that gave out and she fell into the bushes. Apparently she's been falling frequently.  The patient currently denies nausea, vomiting, fever, chest pain, shortness of breath, orthopnea, dizziness, PND, cough, congestion, abdominal pain, hematochezia, melena, lower extremity edema, claudication.  Wt Readings from Last 3 Encounters:  03/31/14 235 lb 8 oz (106.822 kg)  10/28/11 242 lb (109.77 kg)  07/30/11 238 lb 9.6 oz (108.228 kg)     Past Medical History  Diagnosis Date  . HTN (hypertension)   . Ascending aortic aneurysm   . Dyslipidemia   . Tobacco dependence   . Hypothyroidism   . Depression   . Neurogenic bladder   . Obstructive sleep apnea   . Obesity   . Dog bite(E906.0)     RLL with systemic inflammatory response  . Chronic low back pain   . Anxiety   . MS (multiple sclerosis)     Followed by Dr. Sandria [name]  . High cholesterol   . Aortic aneurysm     Current Outpatient Prescriptions  Medication Sig Dispense Refill  . atorvastatin (LIPITOR) 40 MG tablet Take 40 mg by mouth at bedtime.       Marland Kitchen buPROPion (WELLBUTRIN XL) 300 MG 24 hr tablet Take 1 tablet by mouth at bedtime.      . Calcium Carbonate-Vitamin D (CALCIUM + D PO) Take 1 tablet by mouth 2 (two)  times daily.       . Cholecalciferol (VITAMIN D) 2000 UNITS tablet Take 2,000 Units by mouth daily.      . clonazePAM (KLONOPIN) 1 MG tablet Take 1 mg by mouth 3 (three) times daily as needed. Anxiety      . FLUoxetine (PROZAC) 40 MG capsule Take 40 mg by mouth daily.        Marland Kitchen gabapentin (NEURONTIN) 100 MG capsule Take 100-200 mg by mouth every 8 (eight) hours.      Marland Kitchen HYDROcodone-acetaminophen (NORCO/VICODIN) 5-325 MG per tablet Take 1 tablet by mouth as needed.      Marland Kitchen levothyroxine (SYNTHROID, LEVOTHROID) 200 MCG tablet TAKE ONE TABLET DAILY  30 tablet  5  . losartan (COZAAR) 100 MG tablet Take 1 tablet by mouth daily.      Marland Kitchen tiotropium (SPIRIVA) 18 MCG inhalation capsule Place 18 mcg into inhaler and inhale daily as needed. For shortness of breath       . tiZANidine (ZANAFLEX) 4 MG tablet Take 4 mg by mouth every 6 (six) hours as needed. MUSCLE SPASMS       No current facility-administered medications for this visit.    Allergies:    Allergies  Allergen Reactions  . Codeine Other (See Comments)    Real bad chest pains  . Demerol Nausea Only  . Lisinopril Cough  . Penicillins Rash  Pt not sure "chestpains"    Social History:  The patient  reports that she has been smoking Cigarettes.  She has a 15 pack-year smoking history. She has never used smokeless tobacco. She reports that she drinks alcohol. She reports that she does not use illicit drugs.   Family history:  History reviewed. No pertinent family history.  ROS:  Please see the history of present illness.  All other systems reviewed and negative.   PHYSICAL EXAM: VS:  BP 142/96  Pulse 81  Ht 5\' 3"  (1.6 m)  Wt 235 lb 8 oz (106.822 kg)  BMI 41.73 kg/m2 Obese, well developed, in no acute distress HEENT: Pupils are equal round react to light accommodation extraocular movements are intact.  Neck: no JVDNo cervical lymphadenopathy. Cardiac: Regular rate and rhythm without murmurs rubs or gallops. Lungs:  clear to  auscultation bilaterally, no wheezing, rhonchi or rales Abd: soft, nontender, positive bowel sounds all quadrants, no hepatosplenomegaly Ext: Chronic left lower extremity edema.  2+ radial and plus dorsalis pedis pulses. Skin: warm and dry Neuro:  Grossly normal  EKG:   Chronic right bundle branch block, sinus rhythm rate 81 beats per minute QTC of 504 ms    ASSESSMENT AND PLAN:  Problem List Items Addressed This Visit   Ascending aortic aneurysm     Her last CT of the chest 07/17/2012:  Slight prominence of the ascending aorta, similar to prior  exams.  Coronary artery atherosclerosis.     HTN (hypertension)     Mildly elevated today on ARB. No changes    Obesity     Mrs. been very difficult due to limited mobility. He has chronic edema in her left knee and has to walk with a walker. Hopefully after knee surgery she will have improve mobility and be able to work on weight loss    Obstructive sleep apnea      Patient reports she's not using her CPAP. We discussed the ramifications of not doing so.    Preoperative clearance     Given her obesity and chronic tobacco use, we'll obtain lexiscan stress test prior to clearance for surgery which is scheduled for October 19 on the left knee.    TOBACCO ABUSE     Tobacco cessation discussed     Other Visit Diagnoses   Pre-op exam    -  Primary    Relevant Orders       EKG 12-Lead       Myocardial Perfusion Imaging

## 2014-03-31 NOTE — Assessment & Plan Note (Signed)
Mildly elevated today on ARB. No changes

## 2014-03-31 NOTE — Telephone Encounter (Signed)
Encounter complete. 

## 2014-03-31 NOTE — Patient Instructions (Signed)
Your physician recommends that you schedule a follow-up appointment in: 3 Months with Dr [place]  Your physician has requested that you have a lexiscan myoview. For further information please visit [email or web address]. Please follow instruction sheet, as given.

## 2014-03-31 NOTE — Assessment & Plan Note (Addendum)
Given her obesity and chronic tobacco use, we'll obtain lexiscan stress test prior to clearance for surgery which is scheduled for October 19 on the left knee.

## 2014-03-31 NOTE — Assessment & Plan Note (Signed)
Her last CT of the chest 07/17/2012:  Slight prominence of the ascending aorta, similar to prior  exams.  Coronary artery atherosclerosis.

## 2014-03-31 NOTE — Assessment & Plan Note (Signed)
Patient reports she's not using her CPAP. We discussed the ramifications of not doing so.

## 2014-04-01 ENCOUNTER — Ambulatory Visit (HOSPITAL_COMMUNITY)
Admission: RE | Admit: 2014-04-01 | Discharge: 2014-04-01 | Disposition: A | Discharge status: Home / Self Care | Payer: Medicare Other | Source: Ambulatory Visit | Attending: Cardiology | Admitting: Cardiology

## 2014-04-01 DIAGNOSIS — Z87891 Personal history of nicotine dependence: Secondary | ICD-10-CM | POA: Diagnosis not present

## 2014-04-01 DIAGNOSIS — R42 Dizziness and giddiness: Secondary | ICD-10-CM | POA: Insufficient documentation

## 2014-04-01 DIAGNOSIS — I10 Essential (primary) hypertension: Secondary | ICD-10-CM | POA: Insufficient documentation

## 2014-04-01 DIAGNOSIS — E785 Hyperlipidemia, unspecified: Secondary | ICD-10-CM | POA: Insufficient documentation

## 2014-04-01 DIAGNOSIS — R0600 Dyspnea, unspecified: Secondary | ICD-10-CM | POA: Insufficient documentation

## 2014-04-01 DIAGNOSIS — I4510 Unspecified right bundle-branch block: Secondary | ICD-10-CM | POA: Diagnosis not present

## 2014-04-01 DIAGNOSIS — M545 Low back pain: Secondary | ICD-10-CM | POA: Insufficient documentation

## 2014-04-01 DIAGNOSIS — R5383 Other fatigue: Secondary | ICD-10-CM | POA: Diagnosis not present

## 2014-04-01 DIAGNOSIS — Z01810 Encounter for preprocedural cardiovascular examination: Secondary | ICD-10-CM

## 2014-04-01 DIAGNOSIS — Z01818 Encounter for other preprocedural examination: Secondary | ICD-10-CM

## 2014-04-01 MED ORDER — TECHNETIUM TC 99M SESTAMIBI GENERIC - CARDIOLITE
30.4000 | Freq: Once | INTRAVENOUS | Status: AC | PRN
  Administered 2014-04-01: 30 via INTRAVENOUS

## 2014-04-01 MED ORDER — REGADENOSON 0.4 MG/5ML IV SOLN
0.4000 mg | Freq: Once | INTRAVENOUS | Status: AC
  Administered 2014-04-01: 0.4 mg via INTRAVENOUS

## 2014-04-01 MED ORDER — TECHNETIUM TC 99M SESTAMIBI GENERIC - CARDIOLITE
10.2000 | Freq: Once | INTRAVENOUS | Status: AC | PRN
  Administered 2014-04-01: 10 via INTRAVENOUS

## 2014-04-01 MED ORDER — AMINOPHYLLINE 25 MG/ML IV SOLN
100.0000 mg | Freq: Once | INTRAVENOUS | Status: AC
  Administered 2014-04-01: 100 mg via INTRAVENOUS

## 2014-04-01 NOTE — Procedures (Addendum)
[facility] [name] [name] [address] [phone]  Cardiology Nuclear Med Study  [name] [name] is a 73 y.o. female     MRN : [id number]     DOB: March 27, 1941  Procedure Date: 04/01/2014  Nuclear Med Background Indication for Stress Test:  Surgical Clearance History:  No prior cardiac or respiratory history reported;No prior NUC MPI for comparison. Cardiac Risk Factors: History of Smoking, Hypertension, Lipids, Obesity, RBBB, Smoker and Ascending aortic aneurysim;Bradycaria  Symptoms:  Dizziness, DOE, Fatigue, Syncope and chronic lower back pain   Nuclear Pre-Procedure Caffeine/Decaff Intake:  1:00am NPO After: 11am   IV Site: R Forearm  IV 0.9% NS with Angio Cath:  22g  Chest Size (in):  n/a IV Started by: Berdie Ogren, RN  Height: 5\' 3"  (1.6 m)  Cup Size: C  BMI:  Body mass index is 41.64 kg/(m^2). Weight:  235 lb (106.595 kg)   Tech Comments:  n/a    Nuclear Med Study 1 or 2 day study: 1 day  Stress Test Type:  Lexiscan  Order Authorizing Provider:  Peter Swaziland, MD   Resting Radionuclide: Technetium 11m Sestamibi  Resting Radionuclide Dose: 10.2 mCi   Stress Radionuclide:  Technetium 41m Sestamibi  Stress Radionuclide Dose: 30.4 mCi           Stress Protocol Rest HR: 56 Stress HR: 90  Rest BP: 149/99 Stress BP: 166/105  Exercise Time (min): n/a METS: n/a          Dose of Adenosine (mg):  n/a Dose of Lexiscan: 0.4 mg  Dose of Atropine (mg): n/a Dose of Dobutamine: n/a mcg/kg/min (at max HR)  Stress Test Technologist: Ernestene Mention, CCT Nuclear Technologist: Gonzella Lex, CNMT   Rest Procedure:  Myocardial perfusion imaging was performed at rest 45 minutes following the intravenous administration of Technetium 45m Sestamibi. Stress Procedure:  The patient received IV Lexiscan 0.4 mg over 15-seconds.  Technetium 81m Sestamibi injected at 30-seconds.  Patient experienced shortness of breath, throat  fullness and was administered 100 mg of Aminophylline IV at 5 minutes.  There were no significant changes with Lexiscan.  Quantitative spect images were obtained after a 45 minute delay.  Transient Ischemic Dilatation (Normal <1.22):  1.04  QGS EDV:  80 ml QGS ESV:  29 ml LV Ejection Fraction: 64%      Rest ECG: NSR-RBBB  Stress ECG: No significant change from baseline ECG and rare PVC  QPS Raw Data Images:  Normal; no motion artifact; normal heart/lung ratio. Stress Images:  Normal homogeneous uptake in all areas of the myocardium. Rest Images:  Normal homogeneous uptake in all areas of the myocardium. Subtraction (SDS):  Normal  Impression Exercise Capacity:  Lexiscan with no exercise. BP Response:  Exagerated diastolic BP in recovery at 154/110 Clinical Symptoms:  Mild shortnes of breath and throat fullness ECG Impression:  No significant ST segment change suggestive of ischemia. Comparison with Prior Nuclear Study: No images to compare  Overall Impression:  Normal stress nuclear study.  LV Wall Motion:  NL LV Function, EF 64%; NL Wall Motion   [name], MD  04/01/2014 5:29 PM

## 2014-04-04 NOTE — Patient Instructions (Addendum)
Nakiya Rallis  04/04/2014                           YOUR PROCEDURE IS SCHEDULED ON: 04/11/14                ENTER FROM [address] - GO TO PARKING DECK               LOOK FOR VALET [facility]ARKING  / [employer]ARTS                              FOLLOW  SIGNS TO SHORT STAY CENTER                 ARRIVE AT SHORT STAY AT:  9:30 am               CALL THIS NUMBER IF ANY PROBLEMS THE DAY OF SURGERY :               [phone]                                REMEMBER:   Do not eat food or drink liquids AFTER MIDNIGHT             May have water until 6:30 am                Take these medicines the morning of surgery with               A SIPS OF WATER :  CLONAZEPAM / FLUOXETINE / LEVOTHYROXINE              BRING C PAP MASK AND TUBING TO HOSPITAL         Do not wear jewelry, make-up   Do not wear lotions, powders, or perfumes.   Do not shave legs or underarms 12 hrs. before surgery (men may shave face)  Do not bring valuables to the hospital.  Contacts, dentures or bridgework may not be worn into surgery.  Leave suitcase in the car. After surgery it may be brought to your room.  For patients admitted to the hospital more than one night, checkout time is            11:00 AM                                                        ________________________________________________________________________                                                                                                   - PREPARING FOR SURGERY  Before surgery, you can play an important role.  Because skin is not sterile, your skin needs to be as free of germs as possible.  You can reduce  the number of germs on your skin by washing with CHG (chlorahexidine gluconate) soap before surgery.  CHG is an antiseptic cleaner which kills germs and bonds with the skin to continue killing germs even after washing. Please DO NOT use if you have an allergy to CHG or antibacterial soaps.  If your skin becomes  reddened/irritated stop using the CHG and inform your nurse when you arrive at Short Stay. Do not shave (including legs and underarms) for at least 48 hours prior to the first CHG shower.  You may shave your face. Please follow these instructions carefully:   1.  Shower with CHG Soap the night before surgery and the  morning of Surgery.   2.  If you choose to wash your hair, wash your hair first as usual with your  normal  Shampoo.   3.  After you shampoo, rinse your hair and body thoroughly to remove the  shampoo.                                         4.  Use CHG as you would any other liquid soap.  You can apply chg directly  to the skin and wash . Gently wash with scrungie or clean wascloth    5.  Apply the CHG Soap to your body ONLY FROM THE NECK DOWN.   Do not use on open                           Wound or open sores. Avoid contact with eyes, ears mouth and genitals (private parts).                        Genitals (private parts) with your normal soap.              6.  Wash thoroughly, paying special attention to the area where your surgery  will be performed.   7.  Thoroughly rinse your body with warm water from the neck down.   8.  DO NOT shower/wash with your normal soap after using and rinsing off  the CHG Soap .                9.  Pat yourself dry with a clean towel.             10.  Wear clean pajamas.             11.  Place clean sheets on your bed the night of your first shower and do not  sleep with pets.  Day of Surgery : Do not apply any lotions/deodorants the morning of surgery.  Please wear clean clothes to the hospital/surgery center.  FAILURE TO FOLLOW THESE INSTRUCTIONS MAY RESULT IN THE CANCELLATION OF YOUR SURGERY    PATIENT SIGNATURE_________________________________  ______________________________________________________________________     Rogelia MireIncentive Spirometer  An incentive spirometer is a tool that can help keep your lungs clear and active.  This tool measures how well you are filling your lungs with each breath. Taking long deep breaths may help reverse or decrease the chance of developing breathing (pulmonary) problems (especially infection) following:  A long period of time when you are unable to move or be active. BEFORE THE PROCEDURE   If the spirometer includes an indicator to show your best effort, your nurse or respiratory therapist  will set it to a desired goal.  If possible, sit up straight or lean slightly forward. Try not to slouch.  Hold the incentive spirometer in an upright position. INSTRUCTIONS FOR USE  1. Sit on the edge of your bed if possible, or sit up as far as you can in bed or on a chair. 2. Hold the incentive spirometer in an upright position. 3. Breathe out normally. 4. Place the mouthpiece in your mouth and seal your lips tightly around it. 5. Breathe in slowly and as deeply as possible, raising the piston or the ball toward the top of the column. 6. Hold your breath for 3-5 seconds or for as long as possible. Allow the piston or ball to fall to the bottom of the column. 7. Remove the mouthpiece from your mouth and breathe out normally. 8. Rest for a few seconds and repeat Steps 1 through 7 at least 10 times every 1-2 hours when you are awake. Take your time and take a few normal breaths between deep breaths. 9. The spirometer may include an indicator to show your best effort. Use the indicator as a goal to work toward during each repetition. 10. After each set of 10 deep breaths, practice coughing to be sure your lungs are clear. If you have an incision (the cut made at the time of surgery), support your incision when coughing by placing a pillow or rolled up towels firmly against it. Once you are able to get out of bed, walk around indoors and cough well. You may stop using the incentive spirometer when instructed by your caregiver.  RISKS AND COMPLICATIONS  Take your time so you do not get dizzy or  light-headed.  If you are in pain, you may need to take or ask for pain medication before doing incentive spirometry. It is harder to take a deep breath if you are having pain. AFTER USE  Rest and breathe slowly and easily.  It can be helpful to keep track of a log of your progress. Your caregiver can provide you with a simple table to help with this. If you are using the spirometer at home, follow these instructions: SEEK MEDICAL CARE IF:   You are having difficultly using the spirometer.  You have trouble using the spirometer as often as instructed.  Your pain medication is not giving enough relief while using the spirometer.  You develop fever of 100.5 F (38.1 C) or higher. SEEK IMMEDIATE MEDICAL CARE IF:   You cough up bloody sputum that had not been present before.  You develop fever of 102 F (38.9 C) or greater.  You develop worsening pain at or near the incision site. MAKE SURE YOU:   Understand these instructions.  Will watch your condition.  Will get help right away if you are not doing well or get worse. Document Released: 10/21/2006 Document Revised: 09/02/2011 Document Reviewed: 12/22/2006 ExitCare Patient Information 2014 ExitCare, [place].   ________________________________________________________________________  WHAT IS A BLOOD TRANSFUSION? Blood Transfusion Information  A transfusion is the replacement of blood or some of its parts. Blood is made up of multiple cells which provide different functions.  Red blood cells carry oxygen and are used for blood loss replacement.  White blood cells fight against infection.  Platelets control bleeding.  Plasma helps clot blood.  Other blood products are available for specialized needs, such as hemophilia or other clotting disorders. BEFORE THE TRANSFUSION  Who gives blood for transfusions?   Healthy volunteers who are fully  evaluated to make sure their blood is safe. This is blood bank  blood. Transfusion therapy is the safest it has ever been in the practice of medicine. Before blood is taken from a donor, a complete history is taken to make sure that person has no history of diseases nor engages in risky social behavior (examples are intravenous drug use or sexual activity with multiple partners). The donor's travel history is screened to minimize risk of transmitting infections, such as malaria. The donated blood is tested for signs of infectious diseases, such as HIV and hepatitis. The blood is then tested to be sure it is compatible with you in order to minimize the chance of a transfusion reaction. If you or a relative donates blood, this is often done in anticipation of surgery and is not appropriate for emergency situations. It takes many days to process the donated blood. RISKS AND COMPLICATIONS Although transfusion therapy is very safe and saves many lives, the main dangers of transfusion include:   Getting an infectious disease.  Developing a transfusion reaction. This is an allergic reaction to something in the blood you were given. Every precaution is taken to prevent this. The decision to have a blood transfusion has been considered carefully by your caregiver before blood is given. Blood is not given unless the benefits outweigh the risks. AFTER THE TRANSFUSION  Right after receiving a blood transfusion, you will usually feel much better and more energetic. This is especially true if your red blood cells have gotten low (anemic). The transfusion raises the level of the red blood cells which carry oxygen, and this usually causes an energy increase.  The nurse administering the transfusion will monitor you carefully for complications. HOME CARE INSTRUCTIONS  No special instructions are needed after a transfusion. You may find your energy is better. Speak with your caregiver about any limitations on activity for underlying diseases you may have. SEEK MEDICAL CARE IF:    Your condition is not improving after your transfusion.  You develop redness or irritation at the intravenous (IV) site. SEEK IMMEDIATE MEDICAL CARE IF:  Any of the following symptoms occur over the next 12 hours:  Shaking chills.  You have a temperature by mouth above 102 F (38.9 C), not controlled by medicine.  Chest, back, or muscle pain.  People around you feel you are not acting correctly or are confused.  Shortness of breath or difficulty breathing.  Dizziness and fainting.  You get a rash or develop hives.  You have a decrease in urine output.  Your urine turns a dark color or changes to pink, red, or brown. Any of the following symptoms occur over the next 10 days:  You have a temperature by mouth above 102 F (38.9 C), not controlled by medicine.  Shortness of breath.  Weakness after normal activity.  The white part of the eye turns yellow (jaundice).  You have a decrease in the amount of urine or are urinating less often.  Your urine turns a dark color or changes to pink, red, or brown. Document Released: 06/07/2000 Document Revised: 09/02/2011 Document Reviewed: 01/25/2008 [facility] Patient Information 2014 [place], [place].  _______________________________________________________________________

## 2014-04-05 ENCOUNTER — Encounter (HOSPITAL_COMMUNITY)
Admission: RE | Admit: 2014-04-05 | Discharge: 2014-04-05 | Disposition: A | Discharge status: Home / Self Care | Payer: Medicare Other | Source: Ambulatory Visit | Attending: Orthopedic Surgery | Admitting: Orthopedic Surgery

## 2014-04-05 ENCOUNTER — Encounter (HOSPITAL_COMMUNITY): Payer: Self-pay

## 2014-04-05 ENCOUNTER — Telehealth: Payer: Self-pay | Admitting: Physician Assistant

## 2014-04-05 ENCOUNTER — Ambulatory Visit (HOSPITAL_COMMUNITY)
Admission: RE | Admit: 2014-04-05 | Discharge: 2014-04-05 | Disposition: A | Discharge status: Home / Self Care | Payer: Medicare Other | Source: Ambulatory Visit | Attending: Anesthesiology | Admitting: Anesthesiology

## 2014-04-05 ENCOUNTER — Encounter (HOSPITAL_COMMUNITY): Payer: Self-pay | Admitting: Pharmacy Technician

## 2014-04-05 DIAGNOSIS — I10 Essential (primary) hypertension: Secondary | ICD-10-CM | POA: Diagnosis not present

## 2014-04-05 DIAGNOSIS — Z01818 Encounter for other preprocedural examination: Secondary | ICD-10-CM | POA: Insufficient documentation

## 2014-04-05 HISTORY — DX: Personal history of other specified conditions: Z87.898

## 2014-04-05 HISTORY — DX: Irritable bowel syndrome, unspecified: K58.9

## 2014-04-05 HISTORY — DX: Unspecified osteoarthritis, unspecified site: M19.90

## 2014-04-05 HISTORY — DX: Unspecified urinary incontinence: R32

## 2014-04-05 LAB — BASIC METABOLIC PANEL
ANION GAP: 13 (ref 5–15)
BUN: 11 mg/dL (ref 6–23)
CALCIUM: 9.6 mg/dL (ref 8.4–10.5)
CO2: 28 mEq/L (ref 19–32)
Chloride: 102 mEq/L (ref 96–112)
Creatinine, Ser: 0.82 mg/dL (ref 0.50–1.10)
GFR calc Af Amer: 82 mL/min — ABNORMAL LOW (ref >90)
GFR, EST NON AFRICAN AMERICAN: 71 mL/min — AB (ref >90)
Glucose, Bld: 100 mg/dL — ABNORMAL HIGH (ref 70–99)
Potassium: 4.4 mEq/L (ref 3.7–5.3)
Sodium: 143 mEq/L (ref 137–147)

## 2014-04-05 LAB — URINALYSIS, ROUTINE W REFLEX MICROSCOPIC
BILIRUBIN URINE: NEGATIVE
GLUCOSE, UA: NEGATIVE mg/dL
Hgb urine dipstick: NEGATIVE
Ketones, ur: NEGATIVE mg/dL
Nitrite: POSITIVE — AB
PROTEIN: NEGATIVE mg/dL
Specific Gravity, Urine: 1.011 (ref 1.005–1.030)
Urobilinogen, UA: 0.2 mg/dL (ref 0.0–1.0)
pH: 5.5 (ref 5.0–8.0)

## 2014-04-05 LAB — SURGICAL PCR SCREEN
MRSA, PCR: NEGATIVE
Staphylococcus aureus: NEGATIVE

## 2014-04-05 LAB — CBC
HCT: 45.3 % (ref 36.0–46.0)
Hemoglobin: 15.2 g/dL — ABNORMAL HIGH (ref 12.0–15.0)
MCH: 29.6 pg (ref 26.0–34.0)
MCHC: 33.6 g/dL (ref 30.0–36.0)
MCV: 88.1 fL (ref 78.0–100.0)
PLATELETS: 311 10*3/uL (ref 150–400)
RBC: 5.14 MIL/uL — ABNORMAL HIGH (ref 3.87–5.11)
RDW: 13.1 % (ref 11.5–15.5)
WBC: 10.5 10*3/uL (ref 4.0–10.5)

## 2014-04-05 LAB — APTT: aPTT: 28 seconds (ref 24–37)

## 2014-04-05 LAB — ABO/RH: ABO/RH(D): O POS

## 2014-04-05 LAB — PROTIME-INR
INR: 1.12 (ref 0.00–1.49)
Prothrombin Time: 14.5 seconds (ref 11.6–15.2)

## 2014-04-05 LAB — URINE MICROSCOPIC-ADD ON

## 2014-04-05 NOTE — Telephone Encounter (Signed)
Spoke with pt dtr, assured her that clearance has been faxed.

## 2014-04-05 NOTE — Telephone Encounter (Signed)
Spoke with pt, she is upset because clearance has not been sent to dr Charlann Boxer. Nuclear test faxed to dr Charlann Boxer, clearance is on the top of the nuclear report.

## 2014-04-05 NOTE — Telephone Encounter (Signed)
Message copied by Barrie Dunker on Tue Apr 05, 2014 12:21 PM ------      Message from: Dwana Melena      Created: Mon Apr 04, 2014  4:12 PM       Please send preop clearance to Ortho MD stating low risk for procedure and let the patient know her stress test was normal.            Thanks,      Judie Grieve ------

## 2014-04-06 NOTE — Progress Notes (Signed)
ABNORMAL UA faxed to Dr. Charlann Boxer

## 2014-04-06 NOTE — Progress Notes (Signed)
PT notified of surgery time change to 10:00 am - instructed to arrive at short stay at 7:00 am / npo after midnight

## 2014-04-11 ENCOUNTER — Encounter (HOSPITAL_COMMUNITY)
Admission: RE | Disposition: A | Discharge status: Skilled Nursing Facility | Payer: Self-pay | Source: Ambulatory Visit | Attending: Orthopedic Surgery

## 2014-04-11 ENCOUNTER — Inpatient Hospital Stay (HOSPITAL_COMMUNITY): Payer: Medicare Other | Admitting: Anesthesiology

## 2014-04-11 ENCOUNTER — Inpatient Hospital Stay (HOSPITAL_COMMUNITY)
Admission: RE | Admit: 2014-04-11 | Discharge: 2014-04-13 | DRG: 470 | Disposition: A | Discharge status: Skilled Nursing Facility | Payer: Medicare Other | Source: Ambulatory Visit | Attending: Orthopedic Surgery | Admitting: Orthopedic Surgery

## 2014-04-11 ENCOUNTER — Encounter (HOSPITAL_COMMUNITY): Payer: Medicare Other | Admitting: Anesthesiology

## 2014-04-11 ENCOUNTER — Encounter (HOSPITAL_COMMUNITY): Payer: Self-pay | Admitting: *Deleted

## 2014-04-11 DIAGNOSIS — G4733 Obstructive sleep apnea (adult) (pediatric): Secondary | ICD-10-CM | POA: Diagnosis present

## 2014-04-11 DIAGNOSIS — Z6841 Body Mass Index (BMI) 40.0 and over, adult: Secondary | ICD-10-CM | POA: Diagnosis not present

## 2014-04-11 DIAGNOSIS — E039 Hypothyroidism, unspecified: Secondary | ICD-10-CM | POA: Diagnosis present

## 2014-04-11 DIAGNOSIS — M545 Low back pain: Secondary | ICD-10-CM | POA: Diagnosis present

## 2014-04-11 DIAGNOSIS — E785 Hyperlipidemia, unspecified: Secondary | ICD-10-CM | POA: Diagnosis present

## 2014-04-11 DIAGNOSIS — K219 Gastro-esophageal reflux disease without esophagitis: Secondary | ICD-10-CM | POA: Diagnosis present

## 2014-04-11 DIAGNOSIS — I10 Essential (primary) hypertension: Secondary | ICD-10-CM | POA: Diagnosis present

## 2014-04-11 DIAGNOSIS — N319 Neuromuscular dysfunction of bladder, unspecified: Secondary | ICD-10-CM | POA: Diagnosis present

## 2014-04-11 DIAGNOSIS — E780 Pure hypercholesterolemia: Secondary | ICD-10-CM | POA: Diagnosis present

## 2014-04-11 DIAGNOSIS — M179 Osteoarthritis of knee, unspecified: Secondary | ICD-10-CM | POA: Diagnosis present

## 2014-04-11 DIAGNOSIS — F419 Anxiety disorder, unspecified: Secondary | ICD-10-CM | POA: Diagnosis present

## 2014-04-11 DIAGNOSIS — Z96652 Presence of left artificial knee joint: Secondary | ICD-10-CM

## 2014-04-11 DIAGNOSIS — G35 Multiple sclerosis: Secondary | ICD-10-CM | POA: Diagnosis present

## 2014-04-11 DIAGNOSIS — Z87891 Personal history of nicotine dependence: Secondary | ICD-10-CM | POA: Diagnosis not present

## 2014-04-11 DIAGNOSIS — M659 Synovitis and tenosynovitis, unspecified: Secondary | ICD-10-CM | POA: Diagnosis present

## 2014-04-11 DIAGNOSIS — F329 Major depressive disorder, single episode, unspecified: Secondary | ICD-10-CM | POA: Diagnosis present

## 2014-04-11 DIAGNOSIS — G8929 Other chronic pain: Secondary | ICD-10-CM | POA: Diagnosis present

## 2014-04-11 DIAGNOSIS — M25562 Pain in left knee: Secondary | ICD-10-CM | POA: Diagnosis present

## 2014-04-11 DIAGNOSIS — Z96659 Presence of unspecified artificial knee joint: Secondary | ICD-10-CM

## 2014-04-11 HISTORY — PX: TOTAL KNEE ARTHROPLASTY: SHX125

## 2014-04-11 LAB — TYPE AND SCREEN
ABO/RH(D): O POS
ANTIBODY SCREEN: NEGATIVE

## 2014-04-11 SURGERY — ARTHROPLASTY, KNEE, TOTAL
Anesthesia: General | Site: Knee | Laterality: Left

## 2014-04-11 MED ORDER — BUPROPION HCL ER (XL) 300 MG PO TB24
300.0000 mg | ORAL_TABLET | Freq: Every day | ORAL | Status: DC
Start: 2014-04-11 — End: 2014-04-13
  Administered 2014-04-11 – 2014-04-12 (×2): 300 mg via ORAL
  Filled 2014-04-11 (×3): qty 1

## 2014-04-11 MED ORDER — MENTHOL 3 MG MT LOZG: 1.0000 | LOZENGE | OROMUCOSAL | Status: DC | PRN

## 2014-04-11 MED ORDER — PROPOFOL 10 MG/ML IV BOLUS
INTRAVENOUS | Status: AC
  Filled 2014-04-11: qty 20

## 2014-04-11 MED ORDER — LOSARTAN POTASSIUM 50 MG PO TABS
100.0000 mg | ORAL_TABLET | Freq: Every day | ORAL | Status: DC
  Administered 2014-04-11 – 2014-04-13 (×3): 100 mg via ORAL
  Filled 2014-04-11 (×4): qty 2

## 2014-04-11 MED ORDER — LACTATED RINGERS IV SOLN: INTRAVENOUS | Status: DC

## 2014-04-11 MED ORDER — METOCLOPRAMIDE HCL 5 MG/ML IJ SOLN
INTRAMUSCULAR | Status: DC | PRN
  Administered 2014-04-11: 10 mg via INTRAVENOUS

## 2014-04-11 MED ORDER — KETOROLAC TROMETHAMINE 30 MG/ML IJ SOLN
INTRAMUSCULAR | Status: AC
  Filled 2014-04-11: qty 1

## 2014-04-11 MED ORDER — HYDROMORPHONE HCL 1 MG/ML IJ SOLN
INTRAMUSCULAR | Status: AC
  Filled 2014-04-11: qty 1

## 2014-04-11 MED ORDER — CHLORHEXIDINE GLUCONATE 4 % EX LIQD
60.0000 mL | Freq: Once | CUTANEOUS | Status: DC
  Filled 2014-04-11: qty 60

## 2014-04-11 MED ORDER — METOCLOPRAMIDE HCL 5 MG/ML IJ SOLN: 5.0000 mg | Freq: Three times a day (TID) | INTRAMUSCULAR | Status: DC | PRN

## 2014-04-11 MED ORDER — SODIUM CHLORIDE 0.9 % IJ SOLN
INTRAMUSCULAR | Status: DC | PRN
  Administered 2014-04-11: 30 mL

## 2014-04-11 MED ORDER — DIPHENHYDRAMINE HCL 25 MG PO CAPS: 25.0000 mg | ORAL_CAPSULE | Freq: Four times a day (QID) | ORAL | Status: DC | PRN

## 2014-04-11 MED ORDER — KETOROLAC TROMETHAMINE 30 MG/ML IJ SOLN
INTRAMUSCULAR | Status: DC | PRN
  Administered 2014-04-11: 30 mg

## 2014-04-11 MED ORDER — NICOTINE 14 MG/24HR TD PT24
14.0000 mg | MEDICATED_PATCH | Freq: Every day | TRANSDERMAL | Status: DC
  Administered 2014-04-11 – 2014-04-12 (×2): 14 mg via TRANSDERMAL
  Filled 2014-04-11 (×3): qty 1

## 2014-04-11 MED ORDER — LEVOTHYROXINE SODIUM 200 MCG PO TABS
200.0000 ug | ORAL_TABLET | Freq: Every day | ORAL | Status: DC
  Administered 2014-04-12 – 2014-04-13 (×2): 200 ug via ORAL
  Filled 2014-04-11 (×3): qty 1

## 2014-04-11 MED ORDER — HYDROMORPHONE HCL 1 MG/ML IJ SOLN
0.2500 mg | INTRAMUSCULAR | Status: DC | PRN
  Administered 2014-04-11: 0.5 mg via INTRAVENOUS

## 2014-04-11 MED ORDER — PHENYLEPHRINE HCL 10 MG/ML IJ SOLN
INTRAMUSCULAR | Status: DC | PRN
  Administered 2014-04-11 (×6): 80 ug via INTRAVENOUS
  Administered 2014-04-11: 40 ug via INTRAVENOUS
  Administered 2014-04-11: 80 ug via INTRAVENOUS

## 2014-04-11 MED ORDER — KETAMINE HCL 10 MG/ML IJ SOLN
INTRAMUSCULAR | Status: DC | PRN
  Administered 2014-04-11: 30 mg via INTRAVENOUS

## 2014-04-11 MED ORDER — HYDROCODONE-ACETAMINOPHEN 7.5-325 MG PO TABS
1.0000 | ORAL_TABLET | ORAL | Status: DC
  Administered 2014-04-11: 1 via ORAL
  Administered 2014-04-11 – 2014-04-12 (×7): 2 via ORAL
  Administered 2014-04-13 (×2): 1 via ORAL
  Filled 2014-04-11 (×2): qty 2
  Filled 2014-04-11: qty 1
  Filled 2014-04-11: qty 2
  Filled 2014-04-11: qty 1
  Filled 2014-04-11 (×5): qty 2

## 2014-04-11 MED ORDER — PHENYLEPHRINE 40 MCG/ML (10ML) SYRINGE FOR IV PUSH (FOR BLOOD PRESSURE SUPPORT)
PREFILLED_SYRINGE | INTRAVENOUS | Status: AC
  Filled 2014-04-11: qty 10

## 2014-04-11 MED ORDER — ASPIRIN EC 325 MG PO TBEC
325.0000 mg | DELAYED_RELEASE_TABLET | Freq: Two times a day (BID) | ORAL | Status: DC
  Administered 2014-04-12 – 2014-04-13 (×3): 325 mg via ORAL
  Filled 2014-04-11 (×6): qty 1

## 2014-04-11 MED ORDER — FERROUS SULFATE 325 (65 FE) MG PO TABS
325.0000 mg | ORAL_TABLET | Freq: Three times a day (TID) | ORAL | Status: DC
  Administered 2014-04-11 – 2014-04-13 (×5): 325 mg via ORAL
  Filled 2014-04-11 (×8): qty 1

## 2014-04-11 MED ORDER — CLINDAMYCIN PHOSPHATE 600 MG/50ML IV SOLN
600.0000 mg | Freq: Four times a day (QID) | INTRAVENOUS | Status: AC
  Administered 2014-04-11 (×2): 600 mg via INTRAVENOUS
  Filled 2014-04-11 (×3): qty 50

## 2014-04-11 MED ORDER — FENTANYL CITRATE 0.05 MG/ML IJ SOLN
INTRAMUSCULAR | Status: AC
  Filled 2014-04-11: qty 2

## 2014-04-11 MED ORDER — CLINDAMYCIN PHOSPHATE 900 MG/50ML IV SOLN
INTRAVENOUS | Status: AC
  Filled 2014-04-11: qty 50

## 2014-04-11 MED ORDER — FLUOXETINE HCL 20 MG PO CAPS
40.0000 mg | ORAL_CAPSULE | Freq: Every day | ORAL | Status: DC
  Administered 2014-04-12 – 2014-04-13 (×2): 40 mg via ORAL
  Filled 2014-04-11 (×2): qty 2

## 2014-04-11 MED ORDER — DEXAMETHASONE SODIUM PHOSPHATE 10 MG/ML IJ SOLN
INTRAMUSCULAR | Status: AC
  Filled 2014-04-11: qty 1

## 2014-04-11 MED ORDER — ONDANSETRON HCL 4 MG/2ML IJ SOLN: 4.0000 mg | Freq: Four times a day (QID) | INTRAMUSCULAR | Status: DC | PRN

## 2014-04-11 MED ORDER — PROPOFOL 10 MG/ML IV BOLUS
INTRAVENOUS | Status: DC | PRN
  Administered 2014-04-11: 150 mg via INTRAVENOUS

## 2014-04-11 MED ORDER — POLYETHYLENE GLYCOL 3350 17 G PO PACK
17.0000 g | PACK | Freq: Two times a day (BID) | ORAL | Status: DC
  Administered 2014-04-12 – 2014-04-13 (×3): 17 g via ORAL

## 2014-04-11 MED ORDER — METHOCARBAMOL 1000 MG/10ML IJ SOLN
500.0000 mg | Freq: Four times a day (QID) | INTRAVENOUS | Status: DC | PRN
  Filled 2014-04-11: qty 5

## 2014-04-11 MED ORDER — DEXAMETHASONE SODIUM PHOSPHATE 10 MG/ML IJ SOLN: 10.0000 mg | Freq: Once | INTRAMUSCULAR | Status: DC

## 2014-04-11 MED ORDER — MIDAZOLAM HCL 2 MG/2ML IJ SOLN
INTRAMUSCULAR | Status: AC
  Filled 2014-04-11: qty 2

## 2014-04-11 MED ORDER — METOCLOPRAMIDE HCL 5 MG/ML IJ SOLN
INTRAMUSCULAR | Status: AC
  Filled 2014-04-11: qty 2

## 2014-04-11 MED ORDER — DOCUSATE SODIUM 100 MG PO CAPS
100.0000 mg | ORAL_CAPSULE | Freq: Two times a day (BID) | ORAL | Status: DC
  Administered 2014-04-11 – 2014-04-13 (×4): 100 mg via ORAL

## 2014-04-11 MED ORDER — SUCCINYLCHOLINE CHLORIDE 20 MG/ML IJ SOLN
INTRAMUSCULAR | Status: DC | PRN
  Administered 2014-04-11: 100 mg via INTRAVENOUS

## 2014-04-11 MED ORDER — CLINDAMYCIN PHOSPHATE 900 MG/50ML IV SOLN
900.0000 mg | INTRAVENOUS | Status: AC
  Administered 2014-04-11: 900 mg via INTRAVENOUS

## 2014-04-11 MED ORDER — FENTANYL CITRATE 0.05 MG/ML IJ SOLN
INTRAMUSCULAR | Status: DC | PRN
  Administered 2014-04-11: 100 ug via INTRAVENOUS

## 2014-04-11 MED ORDER — HYDROMORPHONE HCL 1 MG/ML IJ SOLN: 0.5000 mg | INTRAMUSCULAR | Status: DC | PRN

## 2014-04-11 MED ORDER — GABAPENTIN 100 MG PO CAPS
100.0000 mg | ORAL_CAPSULE | Freq: Three times a day (TID) | ORAL | Status: DC
  Administered 2014-04-11 – 2014-04-13 (×5): 100 mg via ORAL
  Filled 2014-04-11 (×8): qty 1

## 2014-04-11 MED ORDER — ATORVASTATIN CALCIUM 40 MG PO TABS
40.0000 mg | ORAL_TABLET | Freq: Every day | ORAL | Status: DC
  Administered 2014-04-11 – 2014-04-12 (×2): 40 mg via ORAL
  Filled 2014-04-11 (×3): qty 1

## 2014-04-11 MED ORDER — METOCLOPRAMIDE HCL 10 MG PO TABS: 5.0000 mg | ORAL_TABLET | Freq: Three times a day (TID) | ORAL | Status: DC | PRN

## 2014-04-11 MED ORDER — METHOCARBAMOL 500 MG PO TABS
500.0000 mg | ORAL_TABLET | Freq: Four times a day (QID) | ORAL | Status: DC | PRN
  Administered 2014-04-11 – 2014-04-13 (×3): 500 mg via ORAL
  Filled 2014-04-11 (×3): qty 1

## 2014-04-11 MED ORDER — POTASSIUM CHLORIDE 2 MEQ/ML IV SOLN
INTRAVENOUS | Status: DC
  Administered 2014-04-11 – 2014-04-12 (×2): via INTRAVENOUS
  Filled 2014-04-11 (×7): qty 1000

## 2014-04-11 MED ORDER — ONDANSETRON HCL 4 MG/2ML IJ SOLN
INTRAMUSCULAR | Status: AC
  Filled 2014-04-11: qty 2

## 2014-04-11 MED ORDER — TIOTROPIUM BROMIDE MONOHYDRATE 18 MCG IN CAPS: 18.0000 ug | ORAL_CAPSULE | Freq: Every day | RESPIRATORY_TRACT | Status: DC | PRN

## 2014-04-11 MED ORDER — MAGNESIUM CITRATE PO SOLN: 1.0000 | Freq: Once | ORAL | Status: AC | PRN

## 2014-04-11 MED ORDER — DEXAMETHASONE SODIUM PHOSPHATE 4 MG/ML IJ SOLN
INTRAMUSCULAR | Status: DC | PRN
  Administered 2014-04-11: 10 mg via INTRAVENOUS

## 2014-04-11 MED ORDER — CLONAZEPAM 1 MG PO TABS: 1.0000 mg | ORAL_TABLET | Freq: Three times a day (TID) | ORAL | Status: DC | PRN

## 2014-04-11 MED ORDER — EPHEDRINE SULFATE 50 MG/ML IJ SOLN
INTRAMUSCULAR | Status: AC
  Filled 2014-04-11: qty 1

## 2014-04-11 MED ORDER — BUPIVACAINE-EPINEPHRINE (PF) 0.25% -1:200000 IJ SOLN
INTRAMUSCULAR | Status: DC | PRN
  Administered 2014-04-11: 30 mL

## 2014-04-11 MED ORDER — TRANEXAMIC ACID 100 MG/ML IV SOLN
1000.0000 mg | Freq: Once | INTRAVENOUS | Status: AC
  Administered 2014-04-11: 1000 mg via INTRAVENOUS
  Filled 2014-04-11: qty 10

## 2014-04-11 MED ORDER — BUPIVACAINE-EPINEPHRINE (PF) 0.25% -1:200000 IJ SOLN
INTRAMUSCULAR | Status: AC
  Filled 2014-04-11: qty 30

## 2014-04-11 MED ORDER — CELECOXIB 200 MG PO CAPS
200.0000 mg | ORAL_CAPSULE | Freq: Two times a day (BID) | ORAL | Status: DC
  Administered 2014-04-11 – 2014-04-13 (×4): 200 mg via ORAL
  Filled 2014-04-11 (×5): qty 1

## 2014-04-11 MED ORDER — LACTATED RINGERS IV SOLN
INTRAVENOUS | Status: DC
  Administered 2014-04-11: 11:00:00 via INTRAVENOUS
  Administered 2014-04-11: 1000 mL via INTRAVENOUS

## 2014-04-11 MED ORDER — EPHEDRINE SULFATE 50 MG/ML IJ SOLN
INTRAMUSCULAR | Status: DC | PRN
  Administered 2014-04-11: 5 mg via INTRAVENOUS
  Administered 2014-04-11: 10 mg via INTRAVENOUS
  Administered 2014-04-11 (×2): 5 mg via INTRAVENOUS

## 2014-04-11 MED ORDER — ALUM & MAG HYDROXIDE-SIMETH 200-200-20 MG/5ML PO SUSP: 30.0000 mL | ORAL | Status: DC | PRN

## 2014-04-11 MED ORDER — ONDANSETRON HCL 4 MG/2ML IJ SOLN
INTRAMUSCULAR | Status: DC | PRN
  Administered 2014-04-11: 4 mg via INTRAVENOUS

## 2014-04-11 MED ORDER — ROPIVACAINE HCL 5 MG/ML IJ SOLN
INTRAMUSCULAR | Status: AC
  Filled 2014-04-11: qty 30

## 2014-04-11 MED ORDER — ONDANSETRON HCL 4 MG PO TABS: 4.0000 mg | ORAL_TABLET | Freq: Four times a day (QID) | ORAL | Status: DC | PRN

## 2014-04-11 MED ORDER — DEXAMETHASONE SODIUM PHOSPHATE 10 MG/ML IJ SOLN
10.0000 mg | Freq: Once | INTRAMUSCULAR | Status: AC
  Administered 2014-04-12: 10 mg via INTRAVENOUS
  Filled 2014-04-11 (×2): qty 1

## 2014-04-11 MED ORDER — MIDAZOLAM HCL 2 MG/2ML IJ SOLN
1.0000 mg | INTRAMUSCULAR | Status: DC | PRN
  Administered 2014-04-11: 1 mg via INTRAVENOUS

## 2014-04-11 MED ORDER — BISACODYL 10 MG RE SUPP: 10.0000 mg | Freq: Every day | RECTAL | Status: DC | PRN

## 2014-04-11 MED ORDER — PHENOL 1.4 % MT LIQD: 1.0000 | OROMUCOSAL | Status: DC | PRN

## 2014-04-11 MED ORDER — SODIUM CHLORIDE 0.9 % IJ SOLN
INTRAMUSCULAR | Status: AC
  Filled 2014-04-11: qty 50

## 2014-04-11 MED ORDER — ROPIVACAINE HCL 5 MG/ML IJ SOLN
INTRAMUSCULAR | Status: DC | PRN
  Administered 2014-04-11: 30 mL via PERINEURAL

## 2014-04-11 SURGICAL SUPPLY — 53 items
BAG ZIPLOCK 12X15 (MISCELLANEOUS) ×3 IMPLANT
BANDAGE ELASTIC 6 VELCRO ST LF (GAUZE/BANDAGES/DRESSINGS) ×3 IMPLANT
BANDAGE ESMARK 6X9 LF (GAUZE/BANDAGES/DRESSINGS) ×1 IMPLANT
BLADE SAW SGTL 13.0X1.19X90.0M (BLADE) ×3 IMPLANT
BNDG ESMARK 6X9 LF (GAUZE/BANDAGES/DRESSINGS) ×3
BOWL SMART MIX CTS (DISPOSABLE) ×3 IMPLANT
CAPT RP KNEE ×3 IMPLANT
CEMENT HV SMART SET (Cement) ×6 IMPLANT
CUFF TOURN SGL QUICK 34 (TOURNIQUET CUFF) ×2
CUFF TRNQT CYL 34X4X40X1 (TOURNIQUET CUFF) ×1 IMPLANT
DERMABOND ADVANCED (GAUZE/BANDAGES/DRESSINGS) ×2
DERMABOND ADVANCED .7 DNX12 (GAUZE/BANDAGES/DRESSINGS) ×1 IMPLANT
DRAPE EXTREMITY TIBURON (DRAPES) ×3 IMPLANT
DRAPE POUCH INSTRU U-SHP 10X18 (DRAPES) ×3 IMPLANT
DRAPE U-SHAPE 47X51 STRL (DRAPES) ×3 IMPLANT
DRSG AQUACEL AG ADV 3.5X10 (GAUZE/BANDAGES/DRESSINGS) ×3 IMPLANT
DURAPREP 26ML APPLICATOR (WOUND CARE) ×6 IMPLANT
ELECT REM PT RETURN 9FT ADLT (ELECTROSURGICAL) ×3
ELECTRODE REM PT RTRN 9FT ADLT (ELECTROSURGICAL) ×1 IMPLANT
FACESHIELD WRAPAROUND (MASK) ×15 IMPLANT
GLOVE BIO SURGEON STRL SZ7.5 (GLOVE) ×3 IMPLANT
GLOVE BIOGEL PI IND STRL 7.5 (GLOVE) ×3 IMPLANT
GLOVE BIOGEL PI IND STRL 8.5 (GLOVE) ×1 IMPLANT
GLOVE BIOGEL PI INDICATOR 7.5 (GLOVE) ×6
GLOVE BIOGEL PI INDICATOR 8.5 (GLOVE) ×2
GLOVE ECLIPSE 8.0 STRL XLNG CF (GLOVE) ×3 IMPLANT
GLOVE ORTHO TXT STRL SZ7.5 (GLOVE) ×6 IMPLANT
GLOVE SURG SS PI 7.5 STRL IVOR (GLOVE) ×3 IMPLANT
GOWN SPEC L3 XXLG W/TWL (GOWN DISPOSABLE) IMPLANT
GOWN STRL REIN 2XL XLG LVL4 (GOWN DISPOSABLE) ×3 IMPLANT
GOWN STRL REUS W/TWL 2XL LVL3 (GOWN DISPOSABLE) ×6 IMPLANT
GOWN STRL REUS W/TWL LRG LVL3 (GOWN DISPOSABLE) ×3 IMPLANT
HANDPIECE INTERPULSE COAX TIP (DISPOSABLE) ×2
KIT BASIN OR (CUSTOM PROCEDURE TRAY) ×3 IMPLANT
MANIFOLD NEPTUNE II (INSTRUMENTS) ×3 IMPLANT
NDL SAFETY ECLIPSE 18X1.5 (NEEDLE) ×1 IMPLANT
NEEDLE HYPO 18GX1.5 SHARP (NEEDLE) ×2
PACK TOTAL JOINT (CUSTOM PROCEDURE TRAY) ×3 IMPLANT
POSITIONER SURGICAL ARM (MISCELLANEOUS) ×3 IMPLANT
SET HNDPC FAN SPRY TIP SCT (DISPOSABLE) ×1 IMPLANT
SET PAD KNEE POSITIONER (MISCELLANEOUS) ×3 IMPLANT
SUCTION FRAZIER 12FR DISP (SUCTIONS) ×3 IMPLANT
SUT MNCRL AB 4-0 PS2 18 (SUTURE) ×3 IMPLANT
SUT VIC AB 1 CT1 36 (SUTURE) ×3 IMPLANT
SUT VIC AB 2-0 CT1 27 (SUTURE) ×6
SUT VIC AB 2-0 CT1 TAPERPNT 27 (SUTURE) ×3 IMPLANT
SUT VLOC 180 0 24IN GS25 (SUTURE) ×3 IMPLANT
SYR 50ML LL SCALE MARK (SYRINGE) ×3 IMPLANT
TOWEL OR 17X26 10 PK STRL BLUE (TOWEL DISPOSABLE) ×3 IMPLANT
TOWEL OR NON WOVEN STRL DISP B (DISPOSABLE) IMPLANT
TRAY FOLEY CATH 14FRSI W/METER (CATHETERS) ×3 IMPLANT
WATER STERILE IRR 1500ML POUR (IV SOLUTION) ×3 IMPLANT
WRAP KNEE MAXI GEL POST OP (GAUZE/BANDAGES/DRESSINGS) ×3 IMPLANT

## 2014-04-11 NOTE — Interval H&P Note (Signed)
History and Physical Interval Note:  04/11/2014 9:20 AM  [name] [name]  has presented today for surgery, with the diagnosis of LEFT KNEE OA   The various methods of treatment have been discussed with the patient and family. After consideration of risks, benefits and other options for treatment, the patient has consented to  Procedure(s): LEFT TOTAL KNEE ARTHROPLASTY (Left) as a surgical intervention .  The patient's history has been reviewed, patient examined, no change in status, stable for surgery.  I have reviewed the patient's chart and labs.  Questions were answered to the patient's satisfaction.     Shelda Pal

## 2014-04-11 NOTE — Progress Notes (Signed)
Utilization review completed.  

## 2014-04-11 NOTE — Anesthesia Procedure Notes (Signed)
Anesthesia Regional Block:  Femoral nerve block  Pre-Anesthetic Checklist: ,, timeout performed, Correct Patient, Correct Site, Correct Laterality, Correct Procedure, Correct Position, site marked, Risks and benefits discussed,  Surgical consent,  Pre-op evaluation,  At surgeon's request and post-op pain management  Laterality: Left  Prep: chloraprep       Needles:  Injection technique: Single-shot  Needle Type: Echogenic Stimulator Needle     Needle Length: 15cm 15 cm Needle Gauge: 21 and 21 G    Additional Needles:  Procedures: ultrasound guided (picture in chart) and nerve stimulator Femoral nerve block  Nerve Stimulator or Paresthesia:  Response: 0.5 mA,   Additional Responses:   Narrative:  Start time: 04/11/2014 9:40 AM End time: 04/11/2014 9:50 AM Injection made incrementally with aspirations every 5 mL.  Performed by: Personally  Anesthesiologist: Gaetano Hawthorne MD  Additional Notes: Patient tolerated the procedure well without complications

## 2014-04-11 NOTE — Progress Notes (Signed)
Update given to Dr. Leta Jungling, anesthesiologist, regarding pt's CO2 level and SAO2 level, OK to go to hospital room for continued monitoring and care

## 2014-04-11 NOTE — Plan of Care (Signed)
Problem: Consults Goal: Diagnosis- Total Joint Replacement Primary Total Knee     

## 2014-04-11 NOTE — Progress Notes (Addendum)
Clinical Social Work Department CLINICAL SOCIAL WORK PLACEMENT NOTE 04/11/2014  Patient:  [name] [name], [name] [name]  Account Number:  [id number] Admit date:  04/11/2014  Clinical Social Worker:  Unk Lightning, LCSW  Date/time:  04/11/2014 04:20 PM  Clinical Social Work is seeking post-discharge placement for this patient at the following level of care:   SKILLED NURSING   (*CSW will update this form in Epic as items are completed)   04/11/2014  Patient/family provided with [facility] Department of Clinical Social Work's list of facilities offering this level of care within the geographic area requested by the patient (or if unable, by the patient's family).  04/11/2014  Patient/family informed of their freedom to choose among providers that offer the needed level of care, that participate in Medicare, Medicaid or managed care program needed by the patient, have an available bed and are willing to accept the patient.  04/11/2014  Patient/family informed of [facility]' ownership interest in [facility], as well as of the fact that they are under no obligation to receive care at this facility.  PASARR submitted to EDS on 04/11/2014 PASARR number received on 04/11/2014  FL2 transmitted to all facilities in geographic area requested by pt/family on  04/11/2014 FL2 transmitted to all facilities within larger geographic area on   Patient informed that his/her managed care company has contracts with or will negotiate with  certain facilities, including the following:     Patient/family informed of bed offers received:  04/12/2014 Patient chooses bed at [facility] Physician recommends and patient chooses bed at    Patient to be transferred to  on  [facility] on 04/13/2014 Patient to be transferred to facility by ambulance Sharin Mons) Patient and family notified of transfer on 04/13/2014 Name of family member notified:  Pt notified at bedside  The following physician request were entered in  Epic:   Additional Comments:   Loletta Specter, MSW, LCSW Clinical Social Work Coverage for [employer], [place] [phone]

## 2014-04-11 NOTE — Evaluation (Signed)
Physical Therapy Evaluation Patient Details Name: [name] CounterLinda Mignano MRN: [id number] DOB: 1941/01/02 Today's Date: 04/11/2014   History of Present Illness  LTKA, FNB  Clinical Impression  Limited to mobilizing to edge of bed due to quad weakness, lack of SLR. Pt. Will benefit from PT address  Problems listed  Below.    Follow Up Recommendations SNF;Supervision/Assistance - 24 hour    Equipment Recommendations  None recommended by PT    Recommendations for Other Services       Precautions / Restrictions Precautions Precautions: Fall Precaution Comments: Nerve block      Mobility  Bed Mobility Overal bed mobility: Needs Assistance Bed Mobility: Supine to Sit;Sit to Supine     Supine to sit: Mod assist Sit to supine: Mod assist   General bed mobility comments: assist L LE off and onto bed  Transfers                 General transfer comment: did not stand due to quad  strength lacking  Ambulation/Gait                Stairs            Wheelchair Mobility    Modified Rankin (Stroke Patients Only)       Balance                                             Pertinent Vitals/Pain Pain Assessment: No/denies pain    Home Living Family/patient expects to be discharged to:: Skilled nursing facility Living Arrangements: Children                    Prior Function Level of Independence: Independent with assistive device(s)               Hand Dominance        Extremity/Trunk Assessment               Lower Extremity Assessment: LLE deficits/detail   LLE Deficits / Details: initiates quad, unable to SLR     Communication   Communication: No difficulties  Cognition Arousal/Alertness: Awake/alert Behavior During Therapy: WFL for tasks assessed/performed;Flat affect Overall Cognitive Status: Within Functional Limits for tasks assessed                      General Comments      Exercises         Assessment/Plan    PT Assessment    PT Diagnosis Difficulty walking   PT Problem List    PT Treatment Interventions     PT Goals (Current goals can be found in the Care Plan section) Acute Rehab PT Goals Patient Stated Goal: to walk without my knee buckling. PT Goal Formulation: With patient Time For Goal Achievement: 04/18/14 Potential to Achieve Goals: Good    Frequency     Barriers to discharge        Co-evaluation               End of Session   Activity Tolerance: Patient tolerated treatment well Patient left: in bed;with call bell/phone within reach Nurse Communication: Mobility status         Time: 1710-1726 PT Time Calculation (min): 16 min   Charges:   PT Evaluation $Initial PT Evaluation Tier I: 1 Procedure PT Treatments $Therapeutic Activity: 8-22 mins  PT G Codes:          Rada Hay 04/11/2014, 6:19 PM Blanchard Kelch PT [phone]

## 2014-04-11 NOTE — Transfer of Care (Signed)
Immediate Anesthesia Transfer of Care Note  Patient: [name] [name]  Procedure(s) Performed: Procedure(s): LEFT TOTAL KNEE ARTHROPLASTY (Left)  Patient Location: PACU  Anesthesia Type:GA combined with regional for post-op pain  Level of Consciousness: awake, sedated, patient cooperative and responds to stimulation  Airway & Oxygen Therapy: Patient Spontanous Breathing and Patient connected to face mask oxygen  Post-op Assessment: Report given to PACU RN and Post -op Vital signs reviewed and stable  Post vital signs: Reviewed and stable  Complications: No apparent anesthesia complications

## 2014-04-11 NOTE — Op Note (Signed)
NAME:  [name] [name]                      MEDICAL RECORD NO.:  [id number]                             FACILITY:  [facility]      PHYSICIAN:  Madlyn Frankel. Charlann Boxer, M.D.  DATE OF BIRTH:  [birth year]      DATE OF PROCEDURE:  04/11/2014                                     OPERATIVE REPORT         PREOPERATIVE DIAGNOSIS:  Left knee osteoarthritis.      POSTOPERATIVE DIAGNOSIS:  Left knee osteoarthritis.      FINDINGS:  The patient was noted to have complete loss of cartilage and   bone-on-bone arthritis with associated osteophytes in the medial and patellofemoral compartments of   the knee with a significant synovitis and associated effusion.      PROCEDURE:  Left total knee replacement.      COMPONENTS USED:  [employer] rotating platform posterior stabilized knee   system, a size 3 femur, 2.5 tibia, 12.5 mm PS insert, and 38 patellar   button.      SURGEON:  Madlyn Frankel. Charlann Boxer, M.D.      ASSISTANT:  Lanney Gins, PA-C.      ANESTHESIA:  General.      SPECIMENS:  None.      COMPLICATION:  None.      DRAINS:  None.  EBL: <100cc      TOURNIQUET TIME:   Total Tourniquet Time Documented: Thigh (Left) - 31 minutes Total: Thigh (Left) - 31 minutes  .      The patient was stable to the recovery room.      INDICATION FOR PROCEDURE:  [name] [name] is a 73 y.o. female patient of   mine.  The patient had been seen, evaluated, and treated conservatively in the   office with medication, activity modification, and injections.  The patient had   radiographic changes of bone-on-bone arthritis with endplate sclerosis and osteophytes noted.      The patient failed conservative measures including medication, injections, and activity modification, and at this point was ready for more definitive measures.   Based on the radiographic changes and failed conservative measures, the patient   decided to proceed with total knee replacement.  Risks of infection,   DVT, component failure, need for revision  surgery, postop course, and   expectations were all   discussed and reviewed.  Consent was obtained for benefit of pain   relief.      PROCEDURE IN DETAIL:  The patient was brought to the operative theater.   Once adequate anesthesia, preoperative antibiotics, 900mg  of Cleocin administered, the patient was positioned supine with the left thigh tourniquet placed.  The  left lower extremity was prepped and draped in sterile fashion.  A time-   out was performed identifying the patient, planned procedure, and   extremity.      The left lower extremity was placed in the [facility] leg holder.  The leg was   exsanguinated, tourniquet elevated to 250 mmHg.  A midline incision was   made followed by median parapatellar arthrotomy.  Following initial   exposure, attention was first directed to  the patella.  Precut   measurement was noted to be 22 mm.  I resected down to 14 mm and used a   38 patellar button to restore patellar height as well as cover the cut   surface.      The lug holes were drilled and a metal shim was placed to protect the   patella from retractors and saw blades.      At this point, attention was now directed to the femur.  The femoral   canal was opened with a drill, irrigated to try to prevent fat emboli.  An   intramedullary rod was passed at 3 degrees valgus, 11 mm of bone was   resected off the distal femur due to pre-operative flexion contracture.  Following this resection, the tibia was   subluxated anteriorly.  Using the extramedullary guide, 6 mm of bone was resected off   the proximal medial tibia.  We confirmed the gap would be   stable medially and laterally with a 10 mm insert as well as confirmed   the cut was perpendicular in the coronal plane, checking with an alignment rod.      Once this was done, I sized the femur to be a size 3 in the anterior-   posterior dimension, chose a standard component based on medial and   lateral dimension.  The size 3 rotation  block was then pinned in   position anterior referenced using the C-clamp to set rotation.  The   anterior, posterior, and  chamfer cuts were made without difficulty nor   notching making certain that I was along the anterior cortex to help   with flexion gap stability.      The final box cut was made off the lateral aspect of distal femur.      At this point, the tibia was sized to be a size 2.5, the size 2.5 tray was   then pinned in position through the medial third of the tubercle,   drilled, and keel punched.  Trial reduction was now carried with a 3 femur,  2.5 tibia, a 12.5 mm insert, and the 38 patella botton.  The knee was brought to   extension, full extension with good flexion stability with the patella   tracking through the trochlea without application of pressure.  Given   all these findings, the trial components removed.  Final components were   opened and cement was mixed.  The knee was irrigated with normal saline   solution and pulse lavage.  The synovial lining was   then injected with 30cc of Exaprel of 0.25% Marcaine with epinephrine and 1 cc of Toradol,   total of 61 cc.      The knee was irrigated.  Final implants were then cemented onto clean and   dried cut surfaces of bone with the knee brought to extension with a 12.5 mm trial insert.      Once the cement had fully cured, the excess cement was removed   throughout the knee.  I confirmed I was satisfied with the range of   motion and stability, and the final 12.5 mm PS insert was chosen.  It was   placed into the knee.      The tourniquet had been let down at 31 minutes.  No significant   hemostasis required.  The   extensor mechanism was then reapproximated using #1 Vicryl with the knee   in flexion.  The   remaining  wound was closed with 2-0 Vicryl and running 4-0 Monocryl.   The knee was cleaned, dried, dressed sterilely using Dermabond and   Aquacel dressing.  The patient was then   brought to recovery  room in stable condition, tolerating the procedure   well.   Please note that Physician Assistant, Lanney Gins, PA-C, was present for the entirety of the case, and was utilized for pre-operative positioning, peri-operative retractor management, general facilitation of the procedure.  He was also utilized for primary wound closure at the end of the case.              Madlyn Frankel Charlann Boxer, M.D.    04/11/2014 12:04 PM

## 2014-04-11 NOTE — Anesthesia Preprocedure Evaluation (Addendum)
Anesthesia Evaluation  Patient identified by MRN, date of birth, ID band Patient awake    Reviewed: Allergy & Precautions, H&P , NPO status , Patient's Chart, lab work & pertinent test results  History of Anesthesia Complications (+) PONV  Airway Mallampati: III TM Distance: >3 FB Neck ROM: full    Dental  (+) Caps, Dental Advisory Given All front teeth are capped:   Pulmonary shortness of breath and with exertion, sleep apnea , Current Smoker,  breath sounds clear to auscultation  Pulmonary exam normal       Cardiovascular hypertension, Pt. on medications Rhythm:regular Rate:Normal  Ascending aortic aneurysm. RBBB   Neuro/Psych Anxiety Depression Back surgery. Neurogenic bladder and numbness in left leg and foot negative neurological ROS  negative psych ROS   GI/Hepatic negative GI ROS, Neg liver ROS, GERD-  Medicated and Controlled,  Endo/Other  negative endocrine ROSHypothyroidism Morbid obesity  Renal/GU negative Renal ROS  negative genitourinary   Musculoskeletal   Abdominal (+) + obese,   Peds  Hematology negative hematology ROS (+)   Anesthesia Other Findings   Reproductive/Obstetrics negative OB ROS                        Anesthesia Physical Anesthesia Plan  ASA: III  Anesthesia Plan: General   Post-op Pain Management:    Induction: Intravenous  Airway Management Planned: Oral ETT  Additional Equipment:   Intra-op Plan:   Post-operative Plan: Extubation in OR  Informed Consent: I have reviewed the patients History and Physical, chart, labs and discussed the procedure including the risks, benefits and alternatives for the proposed anesthesia with the patient or authorized representative who has indicated his/her understanding and acceptance.   Dental Advisory Given  Plan Discussed with: CRNA and Surgeon  Anesthesia Plan Comments:         Anesthesia Quick  Evaluation

## 2014-04-11 NOTE — Anesthesia Postprocedure Evaluation (Signed)
  Anesthesia Post-op Note  Patient: [name] [name]  Procedure(s) Performed: Procedure(s) (LRB): LEFT TOTAL KNEE ARTHROPLASTY (Left)  Patient Location: PACU  Anesthesia Type: General  Level of Consciousness: awake and alert   Airway and Oxygen Therapy: Patient Spontanous Breathing  Post-op Pain: mild  Post-op Assessment: Post-op Vital signs reviewed, Patient's Cardiovascular Status Stable, Respiratory Function Stable, Patent Airway and No signs of Nausea or vomiting  Last Vitals:  Filed Vitals:   04/11/14 1446  BP: 148/86  Pulse: 82  Temp: 36.6 C  Resp: 16    Post-op Vital Signs: stable   Complications: No apparent anesthesia complications

## 2014-04-11 NOTE — Progress Notes (Signed)
Clinical Social Work Department BRIEF PSYCHOSOCIAL ASSESSMENT 04/11/2014  Patient:  [name] [name], [name] [name]     Account Number:  [id number]     Admit date:  04/11/2014  Clinical Social Worker:  Earlie Server  Date/Time:  04/11/2014 04:20 PM  Referred by:  Physician  Date Referred:  04/11/2014 Referred for  SNF Placement   Other Referral:   Interview type:  Patient Other interview type:    PSYCHOSOCIAL DATA Living Status:  FAMILY Admitted from facility:   Level of care:   Primary support name:  Robyn Primary support relationship to patient:  CHILD, ADULT Degree of support available:   Strong    CURRENT CONCERNS Current Concerns  Post-Acute Placement   Other Concerns:    SOCIAL WORK ASSESSMENT / PLAN CSW received referral in order to assist with DC planning. CSW reviewed chart and met with patient at bedside. CSW introduced myself and explained role. Patient's dtr at bedside and patient agreeable to family involvement.    Patient reports this was a planned surgery and she wants to go to [facility] [place]. Patient reports she would prefer to return home but knows she needs rehab. CSW explained DC process and CSW role. Patient aware and agreeable to plans.    CSW completed FL2 and faxed information to [facility]. CSW spoke with admissions coordinator who will review information but feels they can accept patient and if any barriers arise then SNF will contact CSW.   Assessment/plan status:  Psychosocial Support/Ongoing Assessment of Needs Other assessment/ plan:   Information/referral to community resources:   SNF list    PATIENT'S/FAMILY'S RESPONSE TO PLAN OF CARE: Patient alert and oriented. Patient reports she feels confident with decision to go to [facility] because her MD has recommended it and heard she will receive good therapy. Patient aware of DC plans and thanked CSW for assistance. Patient and family engaged and reports they will contact CSW with any further needs.        Sindy Messing, LCSW (Coverage for [employer])

## 2014-04-12 LAB — BASIC METABOLIC PANEL
Anion gap: 14 (ref 5–15)
BUN: 12 mg/dL (ref 6–23)
CALCIUM: 8.9 mg/dL (ref 8.4–10.5)
CO2: 24 mEq/L (ref 19–32)
Chloride: 104 mEq/L (ref 96–112)
Creatinine, Ser: 0.82 mg/dL (ref 0.50–1.10)
GFR calc Af Amer: 82 mL/min — ABNORMAL LOW (ref >90)
GFR, EST NON AFRICAN AMERICAN: 71 mL/min — AB (ref >90)
GLUCOSE: 121 mg/dL — AB (ref 70–99)
Potassium: 5 mEq/L (ref 3.7–5.3)
Sodium: 142 mEq/L (ref 137–147)

## 2014-04-12 LAB — CBC
HEMATOCRIT: 40.7 % (ref 36.0–46.0)
HEMOGLOBIN: 13.3 g/dL (ref 12.0–15.0)
MCH: 28.8 pg (ref 26.0–34.0)
MCHC: 32.7 g/dL (ref 30.0–36.0)
MCV: 88.1 fL (ref 78.0–100.0)
Platelets: 306 10*3/uL (ref 150–400)
RBC: 4.62 MIL/uL (ref 3.87–5.11)
RDW: 13.2 % (ref 11.5–15.5)
WBC: 22.6 10*3/uL — ABNORMAL HIGH (ref 4.0–10.5)

## 2014-04-12 MED ORDER — LIP MEDEX EX OINT
TOPICAL_OINTMENT | CUTANEOUS | Status: AC
  Administered 2014-04-12: 15:00:00
  Filled 2014-04-12: qty 7

## 2014-04-12 MED ORDER — LIP MEDEX EX OINT
TOPICAL_OINTMENT | CUTANEOUS | Status: AC
  Administered 2014-04-12: 14:00:00
  Filled 2014-04-12: qty 7

## 2014-04-12 NOTE — Progress Notes (Signed)
Clinical Social Work   Patient was discussed during progression meeting and patient is not medically stable to DC. CSW updated [facility] who remains agreeable to accept patient at DC. CSW will continue to follow.   Kameran Mcneese,LCSW (Coverage for [facility])    

## 2014-04-12 NOTE — Evaluation (Signed)
Occupational Therapy Evaluation Patient Details Name: Sharen CounterLinda Cambridge MRN: [id number] DOB: 10/18/1940 Today's Date: 04/12/2014    History of Present Illness LTKA,    Clinical Impression   This 73 year old female was admitted for the above surgery.  Pt needs +2 assistance for ADLs for physical assist and safety. Used L KI due to buckling.  Pt is unable to release a hand in standing for adls.  Goals in acute are set for overall mod A x 1.  Pt reports she struggled but was mod I with adls prior to admission.    Follow Up Recommendations  SNF    Equipment Recommendations  3 in 1 bedside comode (pt may still have this)    Recommendations for Other Services       Precautions / Restrictions Precautions Precautions: Fall Precaution Comments: Nerve block:  can feel touch but reports sensation decreased Restrictions Weight Bearing Restrictions: No      Mobility Bed Mobility         Supine to sit: Min assist     General bed mobility comments: assist for LLE. HOB raised  Transfers Overall transfer level: Needs assistance Equipment used: Rolling walker (2 wheeled) Transfers: Sit to/from [employer] CorporationStand;Stand Pivot Transfers Sit to Stand: +2 safety/equipment;From elevated surface;Mod assist Stand pivot transfers: +2 safety/equipment;+2 physical assistance;Max assist (moved chair/BSC closer)            Balance                                            ADL Overall ADL's : Needs assistance/impaired     Grooming: Wash/dry hands;Set up;Sitting       Lower Body Bathing: +2 for safety/equipment;Maximal assistance;Sit to/from stand       Lower Body Dressing: +2 for safety/equipment;Total assistance;Sit to/from stand   Toilet Transfer: +2 for physical assistance;BSC;Maximal assistance   Toileting- Clothing Manipulation and Hygiene: +2 for safety/equipment;Total assistance;Sit to/from stand         General ADL Comments: performed SPT to commode.  Pt able to  stand without knee buckling but L knee buckles with movement:  Called ortho tech and KI brought to room.  BSC brought up to pt and when transferring to recliner, recliner moved closer as pt was unable to step backwards with RLE.  Pt has h/o back problems--was performing ADLs without AE.  Pt is unable to release a hand in standing for adls.  She can perform UB adls with set up     Vision                     Perception     Praxis      Pertinent Vitals/Pain Pain Assessment: No/denies pain     Hand Dominance     Extremity/Trunk Assessment Upper Extremity Assessment Upper Extremity Assessment: Overall WFL for tasks assessed           Communication Communication Communication: No difficulties   Cognition Arousal/Alertness: Awake/alert Behavior During Therapy: WFL for tasks assessed/performed;Flat affect Overall Cognitive Status: Within Functional Limits for tasks assessed                     General Comments       Exercises       Shoulder Instructions      Home Living Family/patient expects to be discharged to:: Skilled nursing facility Living Arrangements:  Children                                      Prior Functioning/Environment Level of Independence: Independent with assistive device(s)             OT Diagnosis: Generalized weakness   OT Problem List: Decreased strength;Decreased activity tolerance;Decreased knowledge of use of DME or AE   OT Treatment/Interventions: Self-care/ADL training;DME and/or AE instruction;Patient/family education    OT Goals(Current goals can be found in the care plan section) Acute Rehab OT Goals Patient Stated Goal: to walk without my knee buckling. OT Goal Formulation: With patient Time For Goal Achievement: 04/19/14 Potential to Achieve Goals: Fair ADL Goals Pt Will Perform Grooming: with min assist;standing Pt Will Perform Lower Body Bathing: with mod assist;with adaptive equipment;sit  to/from stand Pt Will Perform Lower Body Dressing: with mod assist;with adaptive equipment;sit to/from stand Pt Will Transfer to Toilet: with mod assist;bedside commode;stand pivot transfer Pt Will Perform Toileting - Clothing Manipulation and hygiene: with mod assist;sit to/from stand  OT Frequency: Min 2X/week   Barriers to D/C:            Co-evaluation              End of Session    Activity Tolerance: Patient limited by fatigue Patient left: in chair;with call bell/phone within reach   Time: 0830-0900 OT Time Calculation (min): 30 min Charges:  OT General Charges $OT Visit: 1 Procedure OT Evaluation $Initial OT Evaluation Tier I: 1 Procedure OT Treatments $Self Care/Home Management : 23-37 mins G-Codes:    Mily Malecki 05/12/14, 9:19 AM  Marica Otter, OTR/L [phone] 05-12-2014

## 2014-04-12 NOTE — Progress Notes (Signed)
Physical Therapy Treatment Patient Details Name: [name] [name] MRN: [id number] DOB: [birth year] Today's Date: 04/12/2014    History of Present Illness LTKA, FNB    PT Comments    POD # 1 applied KI and assisted pt OOB to [facility] + 2 for safety.  Assisted with amb limited distance then performed TKR TE's while in recliner followed by ICE.  Pt progressing slwo and plans to D/c to SNF for ST Rehab.  Follow Up Recommendations  SNF     Equipment Recommendations       Recommendations for Other Services       Precautions / Restrictions Precautions Precautions: Fall Precaution Comments: Hx tremors and MS Restrictions Weight Bearing Restrictions: No    Mobility  Bed Mobility Overal bed mobility: Needs Assistance Bed Mobility: Supine to Sit     Supine to sit: Min assist;Mod assist     General bed mobility comments: assist for LLE. HOB raised plus increased time  Transfers Overall transfer level: Needs assistance Equipment used: Rolling walker (2 wheeled) Transfers: Sit to/from Stand Sit to Stand: +2 safety/equipment;From elevated surface;Mod assist         General transfer comment: used KI this session and assisted pt OOB to [facility] then amb a short distance before positioning in recliner.  Ambulation/Gait Ambulation/Gait assistance: +2 physical assistance;Max assist;Mod assist Ambulation Distance (Feet): 7 Feet Assistive device: Rolling walker (2 wheeled) Gait Pattern/deviations: Step-to pattern;Decreased stance time - left;Trunk flexed Gait velocity: decreased   General Gait Details: 50% VC's on proper tech and hand placement.  Unable to stand upright.  Amb limited distance due to weakness and increased tremors.   Stairs            Wheelchair Mobility    Modified Rankin (Stroke Patients Only)       Balance                                    Cognition                            Exercises   Total Knee Replacement TE's 10 reps  B LE ankle pumps 10 reps towel squeezes 10 reps knee presses 10 reps heel slides  10 reps SAQ's 10 reps SLR's 10 reps ABD Followed by ICE     General Comments        Pertinent Vitals/Pain Pain Assessment: 0-10 Pain Score: 7  Pain Descriptors / Indicators: Aching;Constant Pain Intervention(s): Monitored during session;Premedicated before session;Repositioned;Ice applied    Home Living                      Prior Function            PT Goals (current goals can now be found in the care plan section)      Frequency       PT Plan      Co-evaluation             End of Session Equipment Utilized During Treatment: Gait belt;Left knee immobilizer Activity Tolerance: Patient limited by fatigue Patient left: in chair     Time: 1035-1110 PT Time Calculation (min): 35 min  Charges:  $Gait Training: 8-22 mins $Therapeutic Exercise: 8-22 mins                    G Codes:  Rica Koyanagi  PTA [facility]  Acute  Rehab Pager      [phone]

## 2014-04-12 NOTE — Care Management Note (Signed)
    Page 1 of 1   04/12/2014     12:29:55 PM CARE MANAGEMENT NOTE 04/12/2014  Patient:  ITZAYANA, PILLER   Account Number:  [id number]  Date Initiated:  04/12/2014  Documentation initiated by:  [facility]  Subjective/Objective Assessment:   adm: LEFT TOTAL KNEE ARTHROPLASTY (Left)     Action/Plan:   discharge planning is for SNF   Anticipated DC Date:  04/13/2014   Anticipated DC Plan:  SKILLED NURSING FACILITY      DC Planning Services  CM consult      Choice offered to / List presented to:             Status of service:  Completed, signed off Medicare Important Message given?   (If response is "NO", the following Medicare IM given date fields will be blank) Date Medicare IM given:   Medicare IM given by:   Date Additional Medicare IM given:   Additional Medicare IM given by:    Discharge Disposition:  SKILLED NURSING FACILITY  Per UR Regulation:    If discussed at Long Length of Stay Meetings, dates discussed:    Comments:  04/12/14 12:25 CM notes pt is to go to SNF for rehab; CSW arranging.  No other CM needs were communicated.  Freddy Jaksch, BSN, CM [phone].

## 2014-04-12 NOTE — Progress Notes (Signed)
Patient ID: [name] [name], female   DOB: [birth year], 73 y.o.   MRN: [id number] Subjective: 1 Day Post-Op Procedure(s) (LRB): LEFT TOTAL KNEE ARTHROPLASTY (Left)    Patient reports pain as moderate.  Not much sleep last night.  Ready to do some therapy  Objective:   VITALS:   Filed Vitals:   04/12/14 0931  BP: 126/73  Pulse: 69  Temp: 97.3 F (36.3 C)  Resp: 18    Neurovascular intact Incision: dressing C/D/I  LABS  Recent Labs  04/12/14 0432  HGB 13.3  HCT 40.7  WBC 22.6*  PLT 306     Recent Labs  04/12/14 0432  NA 142  K 5.0  BUN 12  CREATININE 0.82  GLUCOSE 121*    No results found for this basename: LABPT, INR,  in the last 72 hours   Assessment/Plan: 1 Day Post-Op Procedure(s) (LRB): LEFT TOTAL KNEE ARTHROPLASTY (Left)   Advance diet Up with therapy Discharge to SNF at time of discharge, tomorrow if approved

## 2014-04-12 NOTE — Progress Notes (Signed)
Physical Therapy Treatment Patient Details Name: Sharen CounterLinda Ludlam MRN: [id number] DOB: 11-Aug-1940 Today's Date: 04/12/2014    History of Present Illness LTKA, FNB    PT Comments    POD # 1 pm session.  [facility].  Assisted with hygiene.  Pt required + 2 assist for safety.  Assited abck to bed.  Pt to fatigued to attempt amb and was only able to take a few backward steps to bed.  Assisted back to bed.  Follow Up Recommendations  SNF     Equipment Recommendations       Recommendations for Other Services       Precautions / Restrictions Precautions Precautions: Fall Precaution Comments: Hx tremors and MS Restrictions Weight Bearing Restrictions: No    Mobility  Bed Mobility Overal bed mobility: Needs Assistance Bed Mobility: Sit to supine     Sit to supine: Min assist;Mod assist     General bed mobility comments: assist for LLE. HOB raised plus increased time  Transfers Overall transfer level: Needs assistance Equipment used: Rolling walker (2 wheeled) Transfers: Sit to/from Stand Sit to Stand: +2 safety/equipment;From elevated surface;Mod assist         General transfer comment: used KI this session and [facility] then amb a short distance before positioning in recliner.  Ambulation/Gait Ambulation/Gait assistance: +2 physical assistance;Max assist;Mod assist Ambulation Distance (Feet): 1 Feet backward to bed Assistive device: Rolling walker (2 wheeled) Gait Pattern/deviations: Step-to pattern;Decreased stance time - left;Trunk flexed Gait velocity: decreased   General Gait Details: 50% VC's on proper tech and hand placement.  Unable to stand upright.  Amb limited distance due to weakness and increased tremors.   Stairs            Wheelchair Mobility    Modified Rankin (Stroke Patients Only)       Balance                                    Cognition                             Exercises      General Comments        Pertinent Vitals/Pain Pain Assessment: 0-10 Pain Score: 7  Pain Descriptors / Indicators: Aching;Constant Pain Intervention(s): Monitored during session;Premedicated before session;Repositioned;Ice applied    Home Living                      Prior Function            PT Goals (current goals can now be found in the care plan section)      Frequency       PT Plan      Co-evaluation             End of Session Equipment Utilized During Treatment: Gait belt;Left knee immobilizer Activity Tolerance: Patient limited by fatigue Patient left: in bed     Time: 1430-1459 PT Time Calculation (min): 29 min  Charges:   $Therapeutic Activity: 8-22 mins                    G Codes:      Felecia ShellingLori Urvi Imes  PTA [facility]  Acute  Rehab Pager      [phone]

## 2014-04-13 LAB — BASIC METABOLIC PANEL
ANION GAP: 12 (ref 5–15)
BUN: 14 mg/dL (ref 6–23)
CO2: 25 meq/L (ref 19–32)
CREATININE: 0.72 mg/dL (ref 0.50–1.10)
Calcium: 9.2 mg/dL (ref 8.4–10.5)
Chloride: 104 mEq/L (ref 96–112)
GFR calc non Af Amer: 85 mL/min — ABNORMAL LOW (ref >90)
Glucose, Bld: 139 mg/dL — ABNORMAL HIGH (ref 70–99)
Potassium: 4.5 mEq/L (ref 3.7–5.3)
Sodium: 141 mEq/L (ref 137–147)

## 2014-04-13 LAB — CBC
HCT: 40.7 % (ref 36.0–46.0)
Hemoglobin: 13.4 g/dL (ref 12.0–15.0)
MCH: 29.5 pg (ref 26.0–34.0)
MCHC: 32.9 g/dL (ref 30.0–36.0)
MCV: 89.5 fL (ref 78.0–100.0)
PLATELETS: 318 10*3/uL (ref 150–400)
RBC: 4.55 MIL/uL (ref 3.87–5.11)
RDW: 13.3 % (ref 11.5–15.5)
WBC: 20.9 10*3/uL — ABNORMAL HIGH (ref 4.0–10.5)

## 2014-04-13 MED ORDER — CLONAZEPAM 1 MG PO TABS: 1.0000 mg | ORAL_TABLET | Freq: Three times a day (TID) | ORAL | Status: DC | PRN

## 2014-04-13 MED ORDER — TIZANIDINE HCL 4 MG PO TABS: 4.0000 mg | ORAL_TABLET | Freq: Four times a day (QID) | ORAL | Status: DC | PRN

## 2014-04-13 MED ORDER — FERROUS SULFATE 325 (65 FE) MG PO TABS: 325.0000 mg | ORAL_TABLET | Freq: Three times a day (TID) | ORAL | Status: DC

## 2014-04-13 MED ORDER — ASPIRIN 325 MG PO TBEC: 325.0000 mg | DELAYED_RELEASE_TABLET | Freq: Two times a day (BID) | ORAL | Status: AC

## 2014-04-13 MED ORDER — POLYETHYLENE GLYCOL 3350 17 G PO PACK: 17.0000 g | PACK | Freq: Two times a day (BID) | ORAL | Status: DC

## 2014-04-13 MED ORDER — DSS 100 MG PO CAPS: 100.0000 mg | ORAL_CAPSULE | Freq: Two times a day (BID) | ORAL | Status: DC

## 2014-04-13 MED ORDER — HYDROCODONE-ACETAMINOPHEN 7.5-325 MG PO TABS: 1.0000 | ORAL_TABLET | ORAL | Status: DC | PRN

## 2014-04-13 NOTE — Discharge Summary (Signed)
Physician Discharge Summary  Patient ID: [name] [name] MRN: [id number] DOB/AGE: [birth year] 73 y.o.  Admit date: 04/11/2014 Discharge date:  04/13/2014  Procedures:  Procedure(s) (LRB): LEFT TOTAL KNEE ARTHROPLASTY (Left)  Attending Physician:  Dr. Durene Romans   Admission Diagnoses:   Left knee OA / pain  Discharge Diagnoses:  Principal Problem:   S/P left TKA Active Problems:   S/P knee replacement  Past Medical History  Diagnosis Date  . HTN (hypertension)   . Ascending aortic aneurysm   . Dyslipidemia   . Tobacco dependence   . Hypothyroidism   . Depression   . Neurogenic bladder   . Obesity   . Dog bite(E906.0)     RLL with systemic inflammatory response  . Chronic low back pain   . Anxiety   . MS (multiple sclerosis)     Followed by Dr. Sandria Manly  . High cholesterol   . Aortic aneurysm   . Complication of anesthesia   . PONV (postoperative nausea and vomiting)   . Aortic aneurysm   . Numbness     "different spots"  . Arthritis   . History of ulcer disease 30 yrs ago  . IBS (irritable bowel syndrome)   . Incontinence in female   . Obstructive sleep apnea     not currently using c pap    HPI: [name] [name], 73 y.o. female, has a history of pain and functional disability in the left knee due to arthritis and has failed non-surgical conservative treatments for greater than 12 weeks to includeNSAID's and/or analgesics, corticosteriod injections, use of assistive devices and activity modification. Onset of symptoms was gradual, starting ~ 30 years ago with gradually worsening course since that time. The patient noted no past surgery on the left knee(s). Patient currently rates pain in the left knee(s) at 7 out of 10 with activity. Patient has night pain, worsening of pain with activity and weight bearing, pain that interferes with activities of daily living, pain with passive range of motion, crepitus and joint swelling. Patient has evidence of periarticular  osteophytes and joint space narrowing by imaging studies. There is no active infection. Risks, benefits and expectations were discussed with the patient. Risks including but not limited to the risk of anesthesia, blood clots, nerve damage, blood vessel damage, failure of the prosthesis, infection and up to and including death. Patient understand the risks, benefits and expectations and wishes to proceed with surgery.   PCP: Ezequiel Kayser, MD   Discharged Condition: good  Hospital Course:  Patient underwent the above stated procedure on 04/11/2014. Patient tolerated the procedure well and brought to the recovery room in good condition and subsequently to the floor.  POD #1 BP: 126/73 ; Pulse: 69 ; Temp: 97.3 F (36.3 C) ; Resp: 18  Patient reports pain as moderate. Not much sleep last night. Ready to do some therapy. Dorsiflexion/plantar flexion intact, incision: dressing C/D/I, no cellulitis present and compartment soft.   LABS  Basename    HGB  13.3  HCT  40.7   POD #2  BP: 142/87 ; Pulse: 65 ; Temp: 98.4 F (36.9 C) ; Resp: 18  Patient reports pain as mild, pain controlled. No events throughout the night. Ready to be discharged to SNF.  Dorsiflexion/plantar flexion intact, incision: dressing C/D/I, no cellulitis present and compartment soft.   LABS  Basename    HGB  13.4  HCT  40.7    Discharge Exam: General appearance: alert, cooperative and no distress Extremities: Homans sign  is negative, no sign of DVT, no edema, redness or tenderness in the calves or thighs and no ulcers, gangrene or trophic changes  Disposition:    Skilled nursing facility with follow up in 2 weeks   Follow-up Information   Follow up with Shelda Pal, MD. Schedule an appointment as soon as possible for a visit in 2 weeks.   Specialty:  Orthopedic Surgery   Contact information:   [address] [place] [address] [phone]       Discharge Instructions   Call MD / Call  911    Complete by:  As directed   If you experience chest pain or shortness of breath, CALL 911 and be transported to the hospital emergency room.  If you develope a fever above 101 F, pus (white drainage) or increased drainage or redness at the wound, or calf pain, call your surgeon's office.     Change dressing    Complete by:  As directed   Maintain surgical dressing for 10-14 days, or until follow up in the clinic.     Constipation Prevention    Complete by:  As directed   Drink plenty of fluids.  Prune juice may be helpful.  You may use a stool softener, such as Colace (over the [name]) 100 mg twice a day.  Use MiraLax (over the [name]) for constipation as needed.     Diet - low sodium heart healthy    Complete by:  As directed      Discharge instructions    Complete by:  As directed   Maintain surgical dressing for 10-14 days, or until follow up in the clinic. Follow up in 2 weeks at [facility]. Call with any questions or concerns.     Increase activity slowly as tolerated    Complete by:  As directed      TED hose    Complete by:  As directed   Use stockings (TED hose) for 2 weeks on both leg(s).  You may remove them at night for sleeping.     Weight bearing as tolerated    Complete by:  As directed   Laterality:  left  Extremity:  Lower             Medication List    STOP taking these medications       ALEVE 220 MG Caps  Generic drug:  Naproxen Sodium     HYDROcodone-acetaminophen 5-325 MG per tablet  Commonly known as:  NORCO/VICODIN  Replaced by:  HYDROcodone-acetaminophen 7.5-325 MG per tablet      TAKE these medications       aspirin 325 MG EC tablet  Take 1 tablet (325 mg total) by mouth 2 (two) times daily.     atorvastatin 40 MG tablet  Commonly known as:  LIPITOR  Take 40 mg by mouth at bedtime.     buPROPion 300 MG 24 hr tablet  Commonly known as:  WELLBUTRIN XL  Take 1 tablet by mouth at bedtime.     CALCIUM + D PO  Take 1 tablet  by mouth 2 (two) times daily.     clonazePAM 1 MG tablet  Commonly known as:  KLONOPIN  Take 1 tablet (1 mg total) by mouth 3 (three) times daily as needed for anxiety.     DSS 100 MG Caps  Take 100 mg by mouth 2 (two) times daily.     ferrous sulfate 325 (65 FE) MG tablet  Take 1  tablet (325 mg total) by mouth 3 (three) times daily after meals.     FIBER PO  Take 1 tablet by mouth every morning.     FLUoxetine 40 MG capsule  Commonly known as:  PROZAC  Take 40 mg by mouth every morning.     gabapentin 100 MG capsule  Commonly known as:  NEURONTIN  Take 100-200 mg by mouth every 8 (eight) hours.     HYDROcodone-acetaminophen 7.5-325 MG per tablet  Commonly known as:  NORCO  Take 1-2 tablets by mouth every 4 (four) hours as needed for moderate pain.     levothyroxine 200 MCG tablet  Commonly known as:  SYNTHROID, LEVOTHROID  Take 200 mcg by mouth daily before breakfast.     LOMOTIL 2.5-0.025 MG per tablet  Generic drug:  diphenoxylate-atropine  Take 3 tablets by mouth 2 (two) times daily as needed for diarrhea or loose stools.     losartan 100 MG tablet  Commonly known as:  COZAAR  Take 1 tablet by mouth every morning.     polyethylene glycol packet  Commonly known as:  MIRALAX / GLYCOLAX  Take 17 g by mouth 2 (two) times daily.     tiotropium 18 MCG inhalation capsule  Commonly known as:  SPIRIVA  Place 18 mcg into inhaler and inhale daily as needed (shortness of breath.).     tiZANidine 4 MG tablet  Commonly known as:  ZANAFLEX  Take 1 tablet (4 mg total) by mouth every 6 (six) hours as needed for muscle spasms. Takes one tablet (4mg ) in the early afternoon and three tablets (12mg ) at bedtime     VITAMIN B 12 PO  Take 20,000-25,000 mcg by mouth every morning.     Vitamin D 2000 UNITS tablet  Take 4,000 Units by mouth 2 (two) times daily.         Signed: Anastasio AuerbachMatthew S. Vedanshi Massaro   PA-C  04/13/2014, 8:36 AM

## 2014-04-13 NOTE — Progress Notes (Signed)
     Subjective: 2 Days Post-Op Procedure(s) (LRB): LEFT TOTAL KNEE ARTHROPLASTY (Left)   Seen by Dr. Charlann Boxerlin. Patient reports pain as mild, pain controlled. No events throughout the night. Ready to be discharged to SNF if she does well with PT and pain stays controlled.   Objective:   VITALS:   Filed Vitals:   04/13/14 0523  BP: 142/87  Pulse: 65  Temp: 98.4 F (36.9 C)  Resp: 18    Dorsiflexion/Plantar flexion intact Incision: dressing C/D/I No cellulitis present Compartment soft  LABS  Recent Labs  04/12/14 0432 04/13/14 0442  HGB 13.3 13.4  HCT 40.7 40.7  WBC 22.6* 20.9*  PLT 306 318     Recent Labs  04/12/14 0432 04/13/14 0442  NA 142 141  K 5.0 4.5  BUN 12 14  CREATININE 0.82 0.72  GLUCOSE 121* 139*     Assessment/Plan: 2 Days Post-Op Procedure(s) (LRB): LEFT TOTAL KNEE ARTHROPLASTY (Left) Up with therapy Discharge home with home health [facility]. Follow up with OLIN,Vianne Grieshop D in 2 weeks.  Contact information:  [facility] [address] [place] [id number] [phone]        Anastasio AuerbachMatthew S. Levon Penning   PAC  04/13/2014, 8:30 AM

## 2014-04-13 NOTE — Progress Notes (Signed)
[facility] for [facility].   CSW facilitated pt discharge needs including faxing pt discharge information via [facility], discussing with pt at bedside, providing RN phone number to call report, and arranging ambulance transport via [facility].   Pt reports that pt family aware of transport [facility].  No further social work needs identified at this time.  CSW signing off.   Loletta SpecterSuzanna Kidd, MSW, LCSW Clinical Social Work Coverage [place], [place] [phone]

## 2014-04-14 ENCOUNTER — Non-Acute Institutional Stay (SKILLED_NURSING_FACILITY): Payer: Medicare Other | Admitting: Internal Medicine

## 2014-04-14 ENCOUNTER — Other Ambulatory Visit: Payer: Self-pay | Admitting: *Deleted

## 2014-04-14 DIAGNOSIS — M129 Arthropathy, unspecified: Secondary | ICD-10-CM

## 2014-04-14 DIAGNOSIS — E039 Hypothyroidism, unspecified: Secondary | ICD-10-CM

## 2014-04-14 DIAGNOSIS — I10 Essential (primary) hypertension: Secondary | ICD-10-CM

## 2014-04-14 DIAGNOSIS — G2581 Restless legs syndrome: Secondary | ICD-10-CM

## 2014-04-14 DIAGNOSIS — M1712 Unilateral primary osteoarthritis, left knee: Secondary | ICD-10-CM

## 2014-04-14 MED ORDER — HYDROCODONE-ACETAMINOPHEN 7.5-325 MG PO TABS: ORAL_TABLET | ORAL | Status: DC

## 2014-04-14 NOTE — Telephone Encounter (Signed)
[facility] 

## 2014-04-17 NOTE — Progress Notes (Signed)
HISTORY & PHYSICAL  DATE: 04/14/2014   FACILITY: [facility]  LEVEL OF CARE: SNF (31)  ALLERGIES:  Allergies  Allergen Reactions  . Codeine Other (See Comments)    Real bad chest pains  . Demerol Nausea Only  . Lisinopril Cough  . Penicillins Rash    Pt not sure "chestpains"    CHIEF COMPLAINT:  Manage left knee OA, HTN & hypothyroidism  HISTORY OF PRESENT ILLNESS:patient is a 73 y/o Caucasian female:  KNEE OSTEOARTHRITIS: Patient had a history of pain and functional disability in the knee due to end-stage osteoarthritis and has failed nonsurgical conservative treatments. Patient had worsening of pain with activity and weight bearing, pain that interfered with activities of daily living & pain with passive range of motion. Therefore patient underwent total knee arthroplasty and tolerated the procedure well. Patient is admitted to this facility for sort short-term rehabilitation. Patient denies knee pain. C/o LLE swelling.  HTN: Pt 's HTN remains stable.  Denies CP, sob, DOE, headaches, dizziness or visual disturbances.  No complications from the medications currently being used.  Last BP : 96/66, 123/86.  HYPOTHYROIDISM: The hypothyroidism remains stable. No complications noted from the medications presently being used.  The patient denies fatigue or constipation.  Last TSH not available.   PAST MEDICAL HISTORY :  Past Medical History  Diagnosis Date  . HTN (hypertension)   . Ascending aortic aneurysm   . Dyslipidemia   . Tobacco dependence   . Hypothyroidism   . Depression   . Neurogenic bladder   . Obesity   . Dog bite(E906.0)     RLL with systemic inflammatory response  . Chronic low back pain   . Anxiety   . MS (multiple sclerosis)     Followed by Dr. Sandria Manly  . High cholesterol   . Aortic aneurysm   . Complication of anesthesia   . PONV (postoperative nausea and vomiting)   . Aortic aneurysm   . Numbness     "different spots"  .  Arthritis   . History of ulcer disease 30 yrs ago  . IBS (irritable bowel syndrome)   . Incontinence in female   . Obstructive sleep apnea     not currently using c pap    PAST SURGICAL HISTORY: Past Surgical History  Procedure Laterality Date  . Cholecystectomy    . Hysterectomy    . Nasal sinus surgery    . Left eye surgery    . Bilateral foot surgery    . Leg surgery  2006    due to fx leg   . Back surgery    . Total knee arthroplasty Left 04/11/2014    Procedure: LEFT TOTAL KNEE ARTHROPLASTY;  Surgeon: Shelda Pal, MD;  Location: [facility];  Service: Orthopedics;  Laterality: Left;    SOCIAL HISTORY:  reports that she has been smoking Cigarettes.  She has a 15 pack-year smoking history. She has never used smokeless tobacco. She reports that she drinks alcohol. She reports that she does not use illicit drugs.  FAMILY HISTORY: None  CURRENT MEDICATIONS: Reviewed per MAR/see medication list  REVIEW OF SYSTEMS:  See HPI otherwise 14 point ROS is negative.  PHYSICAL EXAMINATION  VS:  See VS section  GENERAL: no acute distress, moderately obese body habitus EYES: conjunctivae normal, sclerae normal, normal eye lids MOUTH/THROAT: lips without lesions,no lesions in the mouth,tongue is without lesions,uvula elevates in midline NECK: supple, trachea midline, no  neck masses, no thyroid tenderness, no thyromegaly LYMPHATICS: no LAN in the neck, no supraclavicular LAN RESPIRATORY: breathing is even & unlabored, BS CTAB CARDIAC: RRR, no murmur,no extra heart sounds, +2 LLE edema GI:  ABDOMEN: abdomen soft, normal BS, no masses, no tenderness  LIVER/SPLEEN: no hepatomegaly, no splenomegaly MUSCULOSKELETAL: HEAD: normal to inspection  EXTREMITIES: LEFT UPPER EXTREMITY: full range of motion, normal strength & tone RIGHT UPPER EXTREMITY:  full range of motion, normal strength & tone LEFT LOWER EXTREMITY: range of motion not tested due to surgery, normal strength & tone RIGHT  LOWER EXTREMITY:  full range of motion, normal strength & tone PSYCHIATRIC: the patient is alert & oriented to person, affect & behavior appropriate  LABS/RADIOLOGY:  Labs reviewed: Basic Metabolic Panel:  Recent Labs  53/66/4410/13/15 1530 04/12/14 0432 04/13/14 0442  NA 143 142 141  K 4.4 5.0 4.5  CL 102 104 104  CO2 28 24 25   GLUCOSE 100* 121* 139*  BUN 11 12 14   CREATININE 0.82 0.82 0.72  CALCIUM 9.6 8.9 9.2   CBC:  Recent Labs  04/05/14 1530 04/12/14 0432 04/13/14 0442  WBC 10.5 22.6* 20.9*  HGB 15.2* 13.3 13.4  HCT 45.3 40.7 40.7  MCV 88.1 88.1 89.5  PLT 311 306 318    Cardiology Nuclear Med Study  Sharen CounterLinda Cumming is a 73 y.o. female     MRN : [id number]     DOB:  1941/01/11  Procedure Date: 04/01/2014  Nuclear Med Background Indication for Stress Test:  Surgical Clearance History:  No prior cardiac or respiratory history reported;No  prior NUC MPI for comparison. Cardiac Risk Factors: History of Smoking, Hypertension, Lipids,  Obesity, RBBB, Smoker and Ascending aortic aneurysim;Bradycaria  Symptoms:  Dizziness, DOE, Fatigue, Syncope and chronic lower  back pain   Nuclear Pre-Procedure Caffeine/Decaff Intake:  1:00am NPO After: 11am   IV Site: R Forearm  IV 0.9% NS with Angio Cath:  22g  Chest Size (in):  n/a IV Started by: Berdie OgrenAmanda Wease, RN  Height: 5\' 3"  (1.6 m)  Cup Size: C  BMI:  Body mass index is 41.64 kg/(m^2). Weight:  235 lb (106.595 kg)   Tech Comments:  n/a    Nuclear Med Study 1 or 2 day study: 1 day  Stress Test Type:  Lexiscan  Order Authorizing Provider:  Peter SwazilandJordan, MD   Resting Radionuclide: Technetium 2476m Sestamibi  Resting  Radionuclide Dose: 10.2 mCi   Stress Radionuclide:  Technetium 4276m Sestamibi  Stress  Radionuclide Dose: 30.4 mCi           Stress Protocol Rest HR: 56 Stress HR: 90  Rest BP: 149/99 Stress BP: 166/105  Exercise Time (min): n/a METS: n/a          Dose of Adenosine (mg):  n/a Dose of Lexiscan: 0.4 mg    Dose of Atropine (mg): n/a Dose of Dobutamine: n/a mcg/kg/min (at max HR)  Stress Test Technologist: Ernestene MentionGwen Farrington, CCT Nuclear  Technologist: Gonzella LexPam Phillips, CNMT   Rest Procedure:  Myocardial perfusion imaging was performed at  rest 45 minutes following the intravenous administration of  Technetium 3876m Sestamibi. Stress Procedure:  The patient received IV Lexiscan 0.4 mg over  15-seconds.  Technetium 7176m Sestamibi injected at 30-seconds.   Patient experienced shortness of breath, throat fullness and was  administered 100 mg of Aminophylline IV at 5 minutes.  There were no significant changes with Lexiscan.  Quantitative spect images  were obtained after a 45 minute delay.  Transient Ischemic  Dilatation (Normal <1.22):  1.04  QGS EDV:  80 ml QGS ESV:  29 ml LV Ejection Fraction: 64%      Rest ECG: NSR-RBBB  Stress ECG: No significant change from baseline ECG and rare PVC  QPS Raw Data Images:  Normal; no motion artifact; normal heart/lung  ratio. Stress Images:  Normal homogeneous uptake in all areas of the  myocardium. Rest Images:  Normal homogeneous uptake in all areas of the  myocardium. Subtraction (SDS):  Normal  Impression Exercise Capacity:  Lexiscan with no exercise. BP Response:  Exagerated diastolic BP in recovery at 154/110 Clinical Symptoms:  Mild shortnes of breath and throat fullness ECG Impression:  No significant ST segment change suggestive of  ischemia. Comparison with Prior Nuclear Study: No images to compare  Overall Impression:  Normal stress nuclear study.  LV Wall Motion:  NL LV Function, EF 64%; NL Wall Motion  CHEST  2 VIEW   COMPARISON:  None.   FINDINGS: The heart size and mediastinal contours are stable. The aorta is tortuous. There is no focal infiltrate, pulmonary edema, or pleural effusion. The visualized skeletal structures are stable.   IMPRESSION: No active cardiopulmonary disease.    ASSESSMENT/PLAN:  Left knee  OA-s/p total knee arthroplasty.  Cont. Rehabilitation. HTN-well controlled. Hypothyroidism-cont. Levothyroxine. RLS-cont. neurontin Hyperlipidemia-cont. lipitor Constipation-well controlled. Check cbc w diff  I have reviewed patient's medical records received at admission/from hospitalization.  CPT CODE: 78295  Angela Cox, MD [facility] [phone]

## 2014-04-21 ENCOUNTER — Non-Acute Institutional Stay (SKILLED_NURSING_FACILITY): Payer: Medicare Other | Admitting: Adult Health

## 2014-04-21 ENCOUNTER — Encounter: Payer: Self-pay | Admitting: Adult Health

## 2014-04-21 DIAGNOSIS — I10 Essential (primary) hypertension: Secondary | ICD-10-CM

## 2014-04-21 DIAGNOSIS — G2581 Restless legs syndrome: Secondary | ICD-10-CM

## 2014-04-21 DIAGNOSIS — F329 Major depressive disorder, single episode, unspecified: Secondary | ICD-10-CM

## 2014-04-21 DIAGNOSIS — Z96652 Presence of left artificial knee joint: Secondary | ICD-10-CM

## 2014-04-21 DIAGNOSIS — F32A Depression, unspecified: Secondary | ICD-10-CM | POA: Insufficient documentation

## 2014-04-21 DIAGNOSIS — E785 Hyperlipidemia, unspecified: Secondary | ICD-10-CM

## 2014-04-21 DIAGNOSIS — E039 Hypothyroidism, unspecified: Secondary | ICD-10-CM | POA: Insufficient documentation

## 2014-04-21 NOTE — Progress Notes (Signed)
Patient ID: [name] [name], female   DOB: [birth year], 73 y.o.   MRN: [id number]              PROGRESS NOTE  DATE: 04/21/2014   FACILITY: [facility]  LEVEL OF CARE: SNF (31)   Discharge Notes  HISTORY OF PRESENT ILLNESS:   This is a 73 year old female who is for discharge home with Home health PT, OT and CNA. DME: 3-in-1 bedside commode and extended tub bench. [facility] with Osteoarthritis S/P left total knee arthroplasty.  Patient was admitted to this facility for short-term rehabilitation after the patient's recent hospitalization.  Patient has completed SNF rehabilitation and therapy has cleared the patient for discharge.  Reassessment of ongoing problem(s):  HTN: Pt 's HTN remains stable.  Denies CP, sob, DOE, pedal edema, headaches, dizziness or visual disturbances.  No complications from the medications currently being used.  Last BP : 138/80  DEPRESSION: The depression remains stable. Patient denies ongoing feelings of sadness, insomnia, anedhonia or lack of appetite. No complications reported from the medications currently being used. Staff do not report behavioral problems.  HYPOTHYROIDISM: The hypothyroidism remains stable. No complications noted from the medications presently being used.  The patient denies fatigue or constipation.  Last TSH not available   Past Medical History  Diagnosis Date  . HTN (hypertension)   . Ascending aortic aneurysm   . Dyslipidemia   . Tobacco dependence   . Hypothyroidism   . Depression   . Neurogenic bladder   . Obesity   . Dog bite(E906.0)     RLL with systemic inflammatory response  . Chronic low back pain   . Anxiety   . MS (multiple sclerosis)     Followed by Dr. Sandria ManlyLove  . High cholesterol   . Aortic aneurysm   . Complication of anesthesia   . PONV (postoperative nausea and vomiting)   . Aortic aneurysm   . Numbness     "different spots"  .  Arthritis   . History of ulcer disease 30 yrs ago  . IBS (irritable bowel syndrome)   . Incontinence in female   . Obstructive sleep apnea     not currently using c pap     Reviewed.  No changes/see problem list  CURRENT MEDICATIONS: Reviewed per MAR/see medication list    Allergies  Allergen Reactions  . Codeine Other (See Comments)    Real bad chest pains  . Demerol Nausea Only  . Lisinopril Cough  . Penicillins Rash    Pt not sure "chestpains"     REVIEW OF SYSTEMS:  GENERAL: no change in appetite, no fatigue, no weight changes, no fever, chills or weakness RESPIRATORY: no cough, SOB, DOE, wheezing, hemoptysis CARDIAC: no chest pain, or palpitations, +edema GI: no abdominal pain, diarrhea, constipation, heart burn, nausea or vomiting  PHYSICAL EXAMINATION  GENERAL: no acute distress, normal body habitus EYES: conjunctivae normal, sclerae normal, normal eye lids NECK: supple, trachea midline, no neck masses, no thyroid tenderness, no thyromegaly LYMPHATICS: no LAN in the neck, no supraclavicular LAN RESPIRATORY: breathing is even & unlabored, BS CTAB CARDIAC: RRR, no murmur,no extra heart sounds, RLE edema 2+ GI: abdomen soft, normal BS, no masses, no tenderness, no hepatomegaly, no splenomegaly EXTREMITIES: able to move all 4 extremities PSYCHIATRIC: the patient is alert & oriented to person, affect & behavior appropriate  LABS/RADIOLOGY: Labs reviewed: Basic Metabolic Panel:  Recent Labs  62/13/0810/13/15  1530 04/12/14 0432 04/13/14 0442  NA 143 142 141  K 4.4 5.0 4.5  CL 102 104 104  CO2 28 24 25   GLUCOSE 100* 121* 139*  BUN 11 12 14   CREATININE 0.82 0.82 0.72  CALCIUM 9.6 8.9 9.2   CBC:  Recent Labs  04/05/14 1530 04/12/14 0432 04/13/14 0442  WBC 10.5 22.6* 20.9*  HGB 15.2* 13.3 13.4  HCT 45.3 40.7 40.7  MCV 88.1 88.1 89.5  PLT 311 306 318   Dg Chest 2 View  04/05/2014   CLINICAL DATA:  Preoperative left TKA.  Hypertension.  EXAM: CHEST  2  VIEW  COMPARISON:  None.  FINDINGS: The heart size and mediastinal contours are stable. The aorta is tortuous. There is no focal infiltrate, pulmonary edema, or pleural effusion. The visualized skeletal structures are stable.  IMPRESSION: No active cardiopulmonary disease.   Electronically Signed   By: Sherian Rein M.D.   On: 04/05/2014 17:37    ASSESSMENT/PLAN:  Osteoarthritis status post left total knee arthroplasty - for home health PT, OT and CNA Hyperlipidemia - continue Lipitor Depression - continue Wellbutrin XL and Prozac Hypothyroidism - continue Synthroid Hypertension - well controlled; continue Cozaar Restless leg syndrome - stable; continue Neurontin    I have filled out patient's discharge paperwork and written prescriptions.  Patient will receive home health PT, OT and CNA.  DME provided: 3-in-1 bedside commode and extended tub bench  Total discharge time: Greater  than 30 minutes  Discharge time involved coordination of the discharge process with social worker, nursing staff and therapy department. Medical justification for home health services/DME verified.  CPT CODE: 93716  [facility], NP [facility] [phone]

## 2014-05-14 ENCOUNTER — Emergency Department (HOSPITAL_COMMUNITY): Payer: Medicare Other

## 2014-05-14 ENCOUNTER — Encounter (HOSPITAL_COMMUNITY): Payer: Self-pay | Admitting: Emergency Medicine

## 2014-05-14 ENCOUNTER — Emergency Department (HOSPITAL_COMMUNITY)
Admission: EM | Admit: 2014-05-14 | Discharge: 2014-05-14 | Disposition: A | Discharge status: Home / Self Care | Payer: Medicare Other | Source: Home / Self Care | Attending: Emergency Medicine | Admitting: Emergency Medicine

## 2014-05-14 DIAGNOSIS — N390 Urinary tract infection, site not specified: Secondary | ICD-10-CM | POA: Diagnosis not present

## 2014-05-14 DIAGNOSIS — Z8742 Personal history of other diseases of the female genital tract: Secondary | ICD-10-CM | POA: Insufficient documentation

## 2014-05-14 DIAGNOSIS — Y92018 Other place in single-family (private) house as the place of occurrence of the external cause: Secondary | ICD-10-CM

## 2014-05-14 DIAGNOSIS — F329 Major depressive disorder, single episode, unspecified: Secondary | ICD-10-CM | POA: Insufficient documentation

## 2014-05-14 DIAGNOSIS — Z87448 Personal history of other diseases of urinary system: Secondary | ICD-10-CM

## 2014-05-14 DIAGNOSIS — E780 Pure hypercholesterolemia: Secondary | ICD-10-CM | POA: Insufficient documentation

## 2014-05-14 DIAGNOSIS — Y9389 Activity, other specified: Secondary | ICD-10-CM | POA: Insufficient documentation

## 2014-05-14 DIAGNOSIS — E669 Obesity, unspecified: Secondary | ICD-10-CM

## 2014-05-14 DIAGNOSIS — F419 Anxiety disorder, unspecified: Secondary | ICD-10-CM

## 2014-05-14 DIAGNOSIS — Z880 Allergy status to penicillin: Secondary | ICD-10-CM | POA: Insufficient documentation

## 2014-05-14 DIAGNOSIS — E039 Hypothyroidism, unspecified: Secondary | ICD-10-CM

## 2014-05-14 DIAGNOSIS — S82401A Unspecified fracture of shaft of right fibula, initial encounter for closed fracture: Secondary | ICD-10-CM

## 2014-05-14 DIAGNOSIS — G8929 Other chronic pain: Secondary | ICD-10-CM | POA: Insufficient documentation

## 2014-05-14 DIAGNOSIS — S82391A Other fracture of lower end of right tibia, initial encounter for closed fracture: Secondary | ICD-10-CM

## 2014-05-14 DIAGNOSIS — G4733 Obstructive sleep apnea (adult) (pediatric): Secondary | ICD-10-CM | POA: Insufficient documentation

## 2014-05-14 DIAGNOSIS — Z8719 Personal history of other diseases of the digestive system: Secondary | ICD-10-CM | POA: Insufficient documentation

## 2014-05-14 DIAGNOSIS — W01198A Fall on same level from slipping, tripping and stumbling with subsequent striking against other object, initial encounter: Secondary | ICD-10-CM | POA: Insufficient documentation

## 2014-05-14 DIAGNOSIS — M1990 Unspecified osteoarthritis, unspecified site: Secondary | ICD-10-CM | POA: Insufficient documentation

## 2014-05-14 DIAGNOSIS — Z87828 Personal history of other (healed) physical injury and trauma: Secondary | ICD-10-CM

## 2014-05-14 DIAGNOSIS — Z79899 Other long term (current) drug therapy: Secondary | ICD-10-CM

## 2014-05-14 DIAGNOSIS — E785 Hyperlipidemia, unspecified: Secondary | ICD-10-CM

## 2014-05-14 DIAGNOSIS — I10 Essential (primary) hypertension: Secondary | ICD-10-CM

## 2014-05-14 DIAGNOSIS — Y998 Other external cause status: Secondary | ICD-10-CM

## 2014-05-14 DIAGNOSIS — Z720 Tobacco use: Secondary | ICD-10-CM

## 2014-05-14 MED ORDER — TIZANIDINE HCL 4 MG PO TABS
4.0000 mg | ORAL_TABLET | Freq: Once | ORAL | Status: AC
  Administered 2014-05-14: 4 mg via ORAL
  Filled 2014-05-14: qty 1

## 2014-05-14 MED ORDER — ONDANSETRON HCL 4 MG/2ML IJ SOLN
4.0000 mg | Freq: Once | INTRAMUSCULAR | Status: AC
  Administered 2014-05-14: 4 mg via INTRAVENOUS
  Filled 2014-05-14: qty 2

## 2014-05-14 MED ORDER — ALPRAZOLAM 0.5 MG PO TABS: 2.0000 mg | ORAL_TABLET | Freq: Once | ORAL | Status: DC

## 2014-05-14 MED ORDER — OXYCODONE-ACETAMINOPHEN 5-325 MG PO TABS: 2.0000 | ORAL_TABLET | ORAL | Status: DC | PRN

## 2014-05-14 MED ORDER — MORPHINE SULFATE 4 MG/ML IJ SOLN
4.0000 mg | INTRAMUSCULAR | Status: DC | PRN
  Administered 2014-05-14 (×2): 4 mg via INTRAVENOUS
  Filled 2014-05-14 (×2): qty 1

## 2014-05-14 NOTE — ED Notes (Signed)
Bed: WA06 Expected date:  Expected time:  Means of arrival:  Comments: 

## 2014-05-14 NOTE — ED Notes (Signed)
Sitting on side of bed eating sandwich.  Will ambulate and medicate following meal, per patient request.

## 2014-05-14 NOTE — ED Notes (Addendum)
Pt from home vis EMS- Per EMS pt had knee replacement in October 2015, has hx of MS. Pt sts that she felt "weak" and fell. Pt hit back of her head on carpeted floor. Pt has hx of previous bilateral ankle fx. Pt has swelling to R ankle, but is able to move ankle. Pt has good pulses in R foot. Pt denies LOC or other injury. Pt is A&O and in NAD. Pt sts ankle pain 3/10.

## 2014-05-14 NOTE — Discharge Instructions (Signed)
Wear the boot. Use your walker for balance or your wheelchair at home. Call Dr.Olin for follow-up appointment  Fibular Fracture, Ankle, Adult, Undisplaced, Treated With Immobilization A simple fracture of the bone below the knee on the outside of your leg (fibula) usually heals without problems. CAUSES Typically, a fibular fracture occurs as a result of trauma. A blow to the side of your leg or a powerful twisting movement can cause a fracture. Fibular fractures are often seen as a result of football, soccer, or skiing injuries. SYMPTOMS Symptoms of a fibular fracture can include:  Pain.  Shortening or abnormal alignment of your lower leg (angulation). DIAGNOSIS A health care provider will need to examine the leg. X-ray exams will be ordered for further to confirm the fracture and evaluate the extent and of the injury. TREATMENT  Typically, a cast or immobilizer is applied. Sometimes a splint is placed on these fractures if it is needed for comfort or if the bones are badly out of place. Crutches may be needed to help you get around.  HOME CARE INSTRUCTIONS   Apply ice to the injured area:  Put ice in a plastic bag.  Place a towel between your skin and the bag.  Leave the ice on for 20 minutes, 2-3 times a day.  Use crutches as directed. Resume walking without crutches as directed by your health care provider or when comfortable doing so.  Only take over-the-counter or prescription medicines for pain, discomfort, or fever as directed by your health care provider.  Keeping your leg raised may lessen swelling.  If you have a removable splint or boot, do not remove the boot unless directed by your health care provider.  Do not not drive a car or operate a motor vehicle until your health care provider specifically tells you it is safe to do so. SEEK IMMEDIATE MEDICAL CARE IF:   Your cast gets damaged or breaks.  You have continued severe pain or more swelling than you did before  the cast was put on, or the pain is not controlled with medications.  Your skin or nails below the injury turn blue or grey, or feel cold or numb.  There is a bad smell or pus coming from under the cast.  You develop severe pain in ankle or foot. MAKE SURE YOU:   Understand these instructions.  Will watch your condition.  Will get help right away if you are not doing well or get worse. Document Released: 03/02/2002 Document Revised: 03/31/2013 Document Reviewed: 01/20/2013 [facility] Patient Information 2015 [place] [employer] [place], [place]. This information is not intended to replace advice given to you by your health care provider. Make sure you discuss any questions you have with your health care provider.

## 2014-05-14 NOTE — ED Provider Notes (Addendum)
CSN: [id number]     Arrival date & time 05/14/14  1746 History   First MD Initiated Contact with Patient 05/14/14 1830     Chief Complaint  Patient presents with  . Ankle Injury      HPI  Patient presents for evaluation of a right ankle injury.  Fell at home and has pain to lateral ankle.  She is 6 wks s/p lt TKA. Getting PT out patient, and at home util 3 days ago.  Ambulatory without aid at times, but still with WC and walker at home. Larey SeatFell today while leaning over to pick something up.  Past Medical History  Diagnosis Date  . HTN (hypertension)   . Ascending aortic aneurysm   . Dyslipidemia   . Tobacco dependence   . Hypothyroidism   . Depression   . Neurogenic bladder   . Obesity   . Dog bite(E906.0)     RLL with systemic inflammatory response  . Chronic low back pain   . Anxiety   . MS (multiple sclerosis)     Followed by Dr. Sandria ManlyLove  . High cholesterol   . Aortic aneurysm   . Complication of anesthesia   . PONV (postoperative nausea and vomiting)   . Aortic aneurysm   . Numbness     "different spots"  . Arthritis   . History of ulcer disease 30 yrs ago  . IBS (irritable bowel syndrome)   . Incontinence in female   . Obstructive sleep apnea     not currently using c pap   Past Surgical History  Procedure Laterality Date  . Cholecystectomy    . Hysterectomy    . Nasal sinus surgery    . Left eye surgery    . Bilateral foot surgery    . Leg surgery  2006    due to fx leg   . Back surgery    . Total knee arthroplasty Left 04/11/2014    Procedure: LEFT TOTAL KNEE ARTHROPLASTY;  Surgeon: Shelda PalMatthew D Olin, MD;  Location: [facility];  Service: Orthopedics;  Laterality: Left;   No family history on file. History  Substance Use Topics  . Smoking status: Current Every Day Smoker -- 0.50 packs/day for 30 years    Types: Cigarettes  . Smokeless tobacco: Never Used  . Alcohol Use: Yes     Comment: Rare alcohol use   OB History    No data available     Review of  Systems  Constitutional: Negative for fever, chills, diaphoresis, appetite change and fatigue.  HENT: Negative for mouth sores, sore throat and trouble swallowing.   Eyes: Negative for visual disturbance.  Respiratory: Negative for cough, chest tightness, shortness of breath and wheezing.   Cardiovascular: Negative for chest pain.  Gastrointestinal: Negative for nausea, vomiting, abdominal pain, diarrhea and abdominal distention.  Endocrine: Negative for polydipsia, polyphagia and polyuria.  Genitourinary: Negative for dysuria, frequency and hematuria.  Musculoskeletal: Positive for gait problem.       Lt ankle pain. Also c/o "Sciatica" to RLE, buttock.  Skin: Negative for color change, pallor and rash.  Neurological: Negative for dizziness, syncope, weakness, light-headedness and headaches.  Hematological: Does not bruise/bleed easily.  Psychiatric/Behavioral: Negative for behavioral problems and confusion.      Allergies  Codeine; Demerol; Lisinopril; and Penicillins  Home Medications   Prior to Admission medications   Medication Sig Start Date End Date Taking? Authorizing Provider  atorvastatin (LIPITOR) 40 MG tablet Take 40 mg by mouth at bedtime.  Yes Historical Provider, MD  buPROPion (WELLBUTRIN XL) 300 MG 24 hr tablet Take 1 tablet by mouth at bedtime. 01/17/14  Yes Historical Provider, MD  Calcium Carbonate-Vitamin D (CALCIUM + D PO) Take 1 tablet by mouth 2 (two) times daily.    Yes Historical Provider, MD  Cholecalciferol (VITAMIN D) 2000 UNITS tablet Take 4,000 Units by mouth 2 (two) times daily.    Yes Historical Provider, MD  clonazePAM (KLONOPIN) 1 MG tablet Take 1 tablet (1 mg total) by mouth 3 (three) times daily as needed for anxiety. 04/13/14  Yes Genelle Gather Babish, PA-C  Cyanocobalamin (VITAMIN B 12 PO) Take 20,000-25,000 mcg by mouth every morning.   Yes Historical Provider, MD  FLUoxetine (PROZAC) 40 MG capsule Take 40 mg by mouth every morning.    Yes  Historical Provider, MD  gabapentin (NEURONTIN) 100 MG capsule Take 100-200 mg by mouth every 8 (eight) hours.   Yes Historical Provider, MD  HYDROcodone-acetaminophen (NORCO) 7.5-325 MG per tablet Take one tablet by mouth every 4 hours as needed for mild to moderate pain; Take two tablets by mouth every 4 hours as needed for moderate to severe pain 04/14/14  Yes Tiffany L Reed, DO  levothyroxine (SYNTHROID, LEVOTHROID) 200 MCG tablet Take 200 mcg by mouth daily before breakfast.   Yes Historical Provider, MD  losartan (COZAAR) 100 MG tablet Take 1 tablet by mouth every morning.  10/15/11  Yes Historical Provider, MD  polyethylene glycol (MIRALAX / GLYCOLAX) packet Take 17 g by mouth 2 (two) times daily. 04/13/14  Yes Genelle Gather Babish, PA-C  tiZANidine (ZANAFLEX) 4 MG tablet Take 1 tablet (4 mg total) by mouth every 6 (six) hours as needed for muscle spasms. Takes one tablet (4mg ) in the early afternoon and three tablets (12mg ) at bedtime Patient taking differently: Take 4-12 mg by mouth 2 (two) times daily. Takes one tablet (4mg ) in the early afternoon and three tablets (12mg ) at bedtime 04/13/14  Yes Genelle Gather Babish, PA-C  diphenoxylate-atropine (LOMOTIL) 2.5-0.025 MG per tablet Take 3 tablets by mouth 2 (two) times daily as needed for diarrhea or loose stools.    Historical Provider, MD  docusate sodium 100 MG CAPS Take 100 mg by mouth 2 (two) times daily. 04/13/14   Genelle Gather Babish, PA-C  ferrous sulfate 325 (65 FE) MG tablet Take 1 tablet (325 mg total) by mouth 3 (three) times daily after meals. 04/13/14   Genelle Gather Babish, PA-C  FIBER PO Take 1 tablet by mouth every morning.    Historical Provider, MD  oxyCODONE-acetaminophen (PERCOCET/ROXICET) 5-325 MG per tablet Take 2 tablets by mouth every 4 (four) hours as needed. 05/14/14   Rolland Porter, MD  tiotropium (SPIRIVA) 18 MCG inhalation capsule Place 18 mcg into inhaler and inhale daily as needed (shortness of breath.).  10/11/10    Waymon Budge, MD   BP 119/74 mmHg  Pulse 89  Temp(Src) 97.8 F (36.6 C) (Oral)  Resp 18  SpO2 95% Physical Exam  Constitutional: She is oriented to person, place, and time. She appears well-developed and well-nourished. No distress.  HENT:  Head: Normocephalic.  Eyes: Conjunctivae are normal. Pupils are equal, round, and reactive to light. No scleral icterus.  Neck: Normal range of motion. Neck supple. No thyromegaly present.  Cardiovascular: Normal rate and regular rhythm.  Exam reveals no gallop and no friction rub.   No murmur heard. Pulmonary/Chest: Effort normal and breath sounds normal. No respiratory distress. She has no wheezes. She has no  rales.  Abdominal: Soft. Bowel sounds are normal. She exhibits no distension. There is no tenderness. There is no rebound.  Musculoskeletal: Normal range of motion.       Legs:      Feet:  Neurological: She is alert and oriented to person, place, and time.  Intact strength and DTRs to BLE.  Lt knee incision intact.  Skin: Skin is warm and dry. No rash noted.  Psychiatric: She has a normal mood and affect. Her behavior is normal.    ED Course  Procedures (including critical care time) Labs Review Labs Reviewed - No data to display  Imaging Review Dg Tibia/fibula Right  05/14/2014   CLINICAL DATA:  Ankle injury.  Fall.  EXAM: RIGHT TIBIA AND FIBULA - 2 VIEW  COMPARISON:  Ankle films 10/23/2011.  FINDINGS: There is no fracture dislocation tibia-fibula. There is a remote fracture of the medial malleolus.  IMPRESSION: No acute fracture.   Electronically Signed   By: Genevive Bi M.D.   On: 05/14/2014 18:41   Dg Ankle Complete Right  05/14/2014   CLINICAL DATA:  Fall today and restaurant.  Right ankle pain.  EXAM: RIGHT ANKLE - COMPLETE 3+ VIEW  COMPARISON:  None.  FINDINGS: There is an oblique fracture through the distal right fibular metaphysis, nondisplaced. Well corticated bone fragments off the tip of the medial malleolus  likely reflect old injury. No acute tibial abnormality seen.  IMPRESSION: Nondisplaced oblique distal right fibular metaphyseal fracture.   Electronically Signed   By: Charlett Nose M.D.   On: 05/14/2014 18:40     EKG Interpretation None      MDM   Final diagnoses:  Fibula fracture, right, closed, initial encounter    Patient has a nondisplaced distal fibular fracture. Her ankle joint mortise otherwise appears intact. No proximal lower leg injury. He is placed in a cam walker. His given pain medication with morphine IV. Her "sciatica" is better. She is able to stand and transfer using her walker. She is doing well with her left knee from her recent total knee. She lives in a 1 level house. She states that she feels she would do well at home. I have requested follow-up from our social worker to help arrange home health care physical therapy evaluation. Patient will follow-up with her orthopedic surgeon with a phone call on Monday to arrange follow-up appointment.    Rolland Porter, MD 05/15/14 1644  Rolland Porter, MD 05/15/14 [phone]

## 2014-05-14 NOTE — ED Notes (Addendum)
Communications notified of patient's need for transport home. 

## 2014-05-17 ENCOUNTER — Inpatient Hospital Stay (HOSPITAL_COMMUNITY)
Admission: EM | Admit: 2014-05-17 | Discharge: 2014-05-21 | DRG: 689 | Disposition: A | Discharge status: Skilled Nursing Facility | Payer: Medicare Other | Attending: Internal Medicine | Admitting: Internal Medicine

## 2014-05-17 ENCOUNTER — Emergency Department (HOSPITAL_COMMUNITY): Payer: Medicare Other

## 2014-05-17 ENCOUNTER — Encounter (HOSPITAL_COMMUNITY): Payer: Self-pay | Admitting: Emergency Medicine

## 2014-05-17 DIAGNOSIS — K760 Fatty (change of) liver, not elsewhere classified: Secondary | ICD-10-CM | POA: Diagnosis present

## 2014-05-17 DIAGNOSIS — Z6836 Body mass index (BMI) 36.0-36.9, adult: Secondary | ICD-10-CM | POA: Diagnosis not present

## 2014-05-17 DIAGNOSIS — G4733 Obstructive sleep apnea (adult) (pediatric): Secondary | ICD-10-CM | POA: Diagnosis present

## 2014-05-17 DIAGNOSIS — G8929 Other chronic pain: Secondary | ICD-10-CM | POA: Diagnosis present

## 2014-05-17 DIAGNOSIS — F17210 Nicotine dependence, cigarettes, uncomplicated: Secondary | ICD-10-CM | POA: Diagnosis present

## 2014-05-17 DIAGNOSIS — I739 Peripheral vascular disease, unspecified: Secondary | ICD-10-CM | POA: Diagnosis present

## 2014-05-17 DIAGNOSIS — G9340 Encephalopathy, unspecified: Secondary | ICD-10-CM | POA: Diagnosis present

## 2014-05-17 DIAGNOSIS — Z888 Allergy status to other drugs, medicaments and biological substances status: Secondary | ICD-10-CM

## 2014-05-17 DIAGNOSIS — E039 Hypothyroidism, unspecified: Secondary | ICD-10-CM | POA: Diagnosis present

## 2014-05-17 DIAGNOSIS — E662 Morbid (severe) obesity with alveolar hypoventilation: Secondary | ICD-10-CM | POA: Diagnosis present

## 2014-05-17 DIAGNOSIS — R109 Unspecified abdominal pain: Secondary | ICD-10-CM

## 2014-05-17 DIAGNOSIS — F419 Anxiety disorder, unspecified: Secondary | ICD-10-CM | POA: Diagnosis present

## 2014-05-17 DIAGNOSIS — R404 Transient alteration of awareness: Secondary | ICD-10-CM | POA: Diagnosis present

## 2014-05-17 DIAGNOSIS — R739 Hyperglycemia, unspecified: Secondary | ICD-10-CM | POA: Diagnosis present

## 2014-05-17 DIAGNOSIS — E785 Hyperlipidemia, unspecified: Secondary | ICD-10-CM | POA: Diagnosis present

## 2014-05-17 DIAGNOSIS — E780 Pure hypercholesterolemia: Secondary | ICD-10-CM | POA: Diagnosis present

## 2014-05-17 DIAGNOSIS — R7401 Elevation of levels of liver transaminase levels: Secondary | ICD-10-CM | POA: Insufficient documentation

## 2014-05-17 DIAGNOSIS — R7302 Impaired glucose tolerance (oral): Secondary | ICD-10-CM

## 2014-05-17 DIAGNOSIS — F329 Major depressive disorder, single episode, unspecified: Secondary | ICD-10-CM | POA: Diagnosis present

## 2014-05-17 DIAGNOSIS — Z96652 Presence of left artificial knee joint: Secondary | ICD-10-CM

## 2014-05-17 DIAGNOSIS — N390 Urinary tract infection, site not specified: Secondary | ICD-10-CM | POA: Diagnosis present

## 2014-05-17 DIAGNOSIS — R7989 Other specified abnormal findings of blood chemistry: Secondary | ICD-10-CM | POA: Diagnosis present

## 2014-05-17 DIAGNOSIS — M1990 Unspecified osteoarthritis, unspecified site: Secondary | ICD-10-CM | POA: Diagnosis present

## 2014-05-17 DIAGNOSIS — M545 Low back pain: Secondary | ICD-10-CM | POA: Diagnosis present

## 2014-05-17 DIAGNOSIS — N319 Neuromuscular dysfunction of bladder, unspecified: Secondary | ICD-10-CM | POA: Diagnosis present

## 2014-05-17 DIAGNOSIS — F17200 Nicotine dependence, unspecified, uncomplicated: Secondary | ICD-10-CM | POA: Diagnosis present

## 2014-05-17 DIAGNOSIS — Z9049 Acquired absence of other specified parts of digestive tract: Secondary | ICD-10-CM | POA: Diagnosis present

## 2014-05-17 DIAGNOSIS — T40604A Poisoning by unspecified narcotics, undetermined, initial encounter: Secondary | ICD-10-CM

## 2014-05-17 DIAGNOSIS — R740 Nonspecific elevation of levels of transaminase and lactic acid dehydrogenase [LDH]: Secondary | ICD-10-CM

## 2014-05-17 DIAGNOSIS — I10 Essential (primary) hypertension: Secondary | ICD-10-CM | POA: Diagnosis present

## 2014-05-17 DIAGNOSIS — Z79899 Other long term (current) drug therapy: Secondary | ICD-10-CM | POA: Diagnosis not present

## 2014-05-17 DIAGNOSIS — Z720 Tobacco use: Secondary | ICD-10-CM

## 2014-05-17 DIAGNOSIS — Z880 Allergy status to penicillin: Secondary | ICD-10-CM

## 2014-05-17 LAB — URINE MICROSCOPIC-ADD ON

## 2014-05-17 LAB — CBC WITH DIFFERENTIAL/PLATELET
Basophils Absolute: 0.1 10*3/uL (ref 0.0–0.1)
Basophils Relative: 1 % (ref 0–1)
Eosinophils Absolute: 0.2 10*3/uL (ref 0.0–0.7)
Eosinophils Relative: 2 % (ref 0–5)
HCT: 41.7 % (ref 36.0–46.0)
Hemoglobin: 13.6 g/dL (ref 12.0–15.0)
Lymphocytes Relative: 18 % (ref 12–46)
Lymphs Abs: 2.3 10*3/uL (ref 0.7–4.0)
MCH: 30.2 pg (ref 26.0–34.0)
MCHC: 32.6 g/dL (ref 30.0–36.0)
MCV: 92.5 fL (ref 78.0–100.0)
Monocytes Absolute: 0.9 10*3/uL (ref 0.1–1.0)
Monocytes Relative: 7 % (ref 3–12)
Neutro Abs: 9.3 10*3/uL — ABNORMAL HIGH (ref 1.7–7.7)
Neutrophils Relative %: 72 % (ref 43–77)
Platelets: 353 10*3/uL (ref 150–400)
RBC: 4.51 MIL/uL (ref 3.87–5.11)
RDW: 13.9 % (ref 11.5–15.5)
WBC: 12.8 10*3/uL — ABNORMAL HIGH (ref 4.0–10.5)

## 2014-05-17 LAB — I-STAT ARTERIAL BLOOD GAS, ED
Bicarbonate: 23.6 mEq/L (ref 20.0–24.0)
O2 Saturation: 96 %
Patient temperature: 98.6
TCO2: 25 mmol/L (ref 0–100)
pCO2 arterial: 34.1 mmHg — ABNORMAL LOW (ref 35.0–45.0)
pH, Arterial: 7.447 (ref 7.350–7.450)
pO2, Arterial: 77 mmHg — ABNORMAL LOW (ref 80.0–100.0)

## 2014-05-17 LAB — URINALYSIS, ROUTINE W REFLEX MICROSCOPIC
Bilirubin Urine: NEGATIVE
Glucose, UA: NEGATIVE mg/dL
Ketones, ur: NEGATIVE mg/dL
Nitrite: POSITIVE — AB
Protein, ur: NEGATIVE mg/dL
Specific Gravity, Urine: 1.027 (ref 1.005–1.030)
Urobilinogen, UA: 0.2 mg/dL (ref 0.0–1.0)
pH: 5 (ref 5.0–8.0)

## 2014-05-17 LAB — COMPREHENSIVE METABOLIC PANEL
ALT: 37 U/L — ABNORMAL HIGH (ref 0–35)
AST: 37 U/L (ref 0–37)
Albumin: 3.1 g/dL — ABNORMAL LOW (ref 3.5–5.2)
Alkaline Phosphatase: 124 U/L — ABNORMAL HIGH (ref 39–117)
Anion gap: 15 (ref 5–15)
BUN: 18 mg/dL (ref 6–23)
CO2: 23 mEq/L (ref 19–32)
Calcium: 9.1 mg/dL (ref 8.4–10.5)
Chloride: 98 mEq/L (ref 96–112)
Creatinine, Ser: 0.78 mg/dL (ref 0.50–1.10)
GFR calc Af Amer: >90 mL/min (ref >90)
GFR calc non Af Amer: 83 mL/min — ABNORMAL LOW (ref >90)
Glucose, Bld: 212 mg/dL — ABNORMAL HIGH (ref 70–99)
Potassium: 4.8 mEq/L (ref 3.7–5.3)
Sodium: 136 mEq/L — ABNORMAL LOW (ref 137–147)
Total Bilirubin: 0.4 mg/dL (ref 0.3–1.2)
Total Protein: 7.1 g/dL (ref 6.0–8.3)

## 2014-05-17 LAB — LACTIC ACID, PLASMA: Lactic Acid, Venous: 2.4 mmol/L — ABNORMAL HIGH (ref 0.5–2.2)

## 2014-05-17 LAB — TROPONIN I: Troponin I: <0.3 ng/mL (ref <0.30)

## 2014-05-17 LAB — I-STAT CG4 LACTIC ACID, ED: Lactic Acid, Venous: 2.18 mmol/L (ref 0.5–2.2)

## 2014-05-17 LAB — MRSA PCR SCREENING: MRSA by PCR: NEGATIVE

## 2014-05-17 MED ORDER — DEXTROSE 5 % IV SOLN
3.0000 mg/h | INTRAVENOUS | Status: DC
  Administered 2014-05-17: 3 mg/h via INTRAVENOUS
  Filled 2014-05-17 (×2): qty 4

## 2014-05-17 MED ORDER — ACETAMINOPHEN 650 MG RE SUPP: 650.0000 mg | Freq: Four times a day (QID) | RECTAL | Status: DC | PRN

## 2014-05-17 MED ORDER — FLUOXETINE HCL 20 MG PO CAPS
40.0000 mg | ORAL_CAPSULE | Freq: Every day | ORAL | Status: DC
  Administered 2014-05-18 – 2014-05-21 (×4): 40 mg via ORAL
  Filled 2014-05-17 (×4): qty 2

## 2014-05-17 MED ORDER — ENOXAPARIN SODIUM 60 MG/0.6ML ~~LOC~~ SOLN
50.0000 mg | SUBCUTANEOUS | Status: DC
  Administered 2014-05-17 – 2014-05-20 (×4): 50 mg via SUBCUTANEOUS
  Filled 2014-05-17 (×5): qty 0.6

## 2014-05-17 MED ORDER — CIPROFLOXACIN IN D5W 400 MG/200ML IV SOLN
400.0000 mg | Freq: Once | INTRAVENOUS | Status: AC
  Administered 2014-05-17: 400 mg via INTRAVENOUS
  Filled 2014-05-17: qty 200

## 2014-05-17 MED ORDER — NALOXONE HCL 1 MG/ML IJ SOLN
2.0000 mg | Freq: Once | INTRAMUSCULAR | Status: AC
  Administered 2014-05-17: 2 mg via INTRAVENOUS

## 2014-05-17 MED ORDER — FLUOXETINE HCL 40 MG PO CAPS: 40.0000 mg | ORAL_CAPSULE | ORAL | Status: DC

## 2014-05-17 MED ORDER — ACETAMINOPHEN 325 MG PO TABS
650.0000 mg | ORAL_TABLET | Freq: Four times a day (QID) | ORAL | Status: DC | PRN
  Administered 2014-05-18: 650 mg via ORAL
  Filled 2014-05-17 (×2): qty 2

## 2014-05-17 MED ORDER — ATORVASTATIN CALCIUM 40 MG PO TABS
40.0000 mg | ORAL_TABLET | Freq: Every day | ORAL | Status: DC
  Administered 2014-05-17 – 2014-05-19 (×3): 40 mg via ORAL
  Filled 2014-05-17 (×4): qty 1

## 2014-05-17 MED ORDER — SODIUM CHLORIDE 0.9 % IV SOLN
INTRAVENOUS | Status: DC
  Administered 2014-05-17 – 2014-05-21 (×4): via INTRAVENOUS

## 2014-05-17 MED ORDER — NALOXONE HCL 1 MG/ML IJ SOLN
4.0000 mg | Freq: Once | INTRAMUSCULAR | Status: AC
  Administered 2014-05-17: 4 mg via INTRAVENOUS

## 2014-05-17 MED ORDER — ONDANSETRON HCL 4 MG/2ML IJ SOLN
4.0000 mg | Freq: Four times a day (QID) | INTRAMUSCULAR | Status: DC | PRN
  Administered 2014-05-18 – 2014-05-19 (×3): 4 mg via INTRAVENOUS
  Filled 2014-05-17 (×3): qty 2

## 2014-05-17 MED ORDER — ONDANSETRON HCL 4 MG PO TABS
4.0000 mg | ORAL_TABLET | Freq: Four times a day (QID) | ORAL | Status: DC | PRN
  Administered 2014-05-17 – 2014-05-18 (×3): 4 mg via ORAL
  Filled 2014-05-17 (×3): qty 1

## 2014-05-17 MED ORDER — LEVOTHYROXINE SODIUM 100 MCG IV SOLR
100.0000 ug | Freq: Every day | INTRAVENOUS | Status: DC
  Administered 2014-05-17 – 2014-05-18 (×2): 100 ug via INTRAVENOUS
  Filled 2014-05-17 (×2): qty 5

## 2014-05-17 MED ORDER — LEVOFLOXACIN IN D5W 500 MG/100ML IV SOLN
500.0000 mg | INTRAVENOUS | Status: DC
  Administered 2014-05-17 – 2014-05-20 (×3): 500 mg via INTRAVENOUS
  Filled 2014-05-17 (×6): qty 100

## 2014-05-17 NOTE — H&P (Signed)
[facility] History and Physical  Corda Shutt ZOX:[id number] DOB: February 28, 1941 DOA: 05/17/2014  Referring physician: EDP PCP: Ezequiel Kayser, MD   Chief Complaint: unresponsive  HPI: [name] [name] is a 73 y.o. female with PMH of Anxiety, Depression, Tobacco use, OSA, chronic back pain, was brought to the ER by EMS with decreased responsiveness. She had L TKA 1 month ago, briefly went to Rehab for 2 weeks and then went home and was seen in there ER 3days ago following a fall and R ankle injury noted to have a nondisplaced R fib Fx and advised to FU with Orthopedics. This morning she was found to be unresponsive by daughter and brought to the ER. Yesterday too daughter noticed that she had a period of lethargy and decreased responsiveness during the daytime which improved. In ER, with pin point pupils, poorly responsive, briefly improved with narcan and then fell back to sleep again and hence started on a narcan gtt. Workup with mild leukocytosis, abnormal UA, Ct head and CXR unremarkable   Review of Systems: unable to obtain due to AMS Constitutional:  No weight loss, night sweats, Fevers, chills, fatigue.  HEENT:  No headaches, Difficulty swallowing,Tooth/dental problems,Sore throat,  No sneezing, itching, ear ache, nasal congestion, post nasal drip,  Cardio-vascular:  No chest pain, Orthopnea, PND, swelling in lower extremities, anasarca, dizziness, palpitations  GI:  No heartburn, indigestion, abdominal pain, nausea, vomiting, diarrhea, change in bowel habits, loss of appetite  Resp:  No shortness of breath with exertion or at rest. No excess mucus, no productive cough, No non-productive cough, No coughing up of blood.No change in color of mucus.No wheezing.No chest wall deformity  Skin:  no rash or lesions.  GU:  no dysuria, change in color of urine, no urgency or frequency. No flank pain.  Musculoskeletal:  No joint pain or swelling. No decreased range of motion. No back  pain.  Psych:  No change in mood or affect. No depression or anxiety. No memory loss.   Past Medical History  Diagnosis Date  . HTN (hypertension)   . Ascending aortic aneurysm   . Dyslipidemia   . Tobacco dependence   . Hypothyroidism   . Depression   . Neurogenic bladder   . Obesity   . Dog bite(E906.0)     RLL with systemic inflammatory response  . Chronic low back pain   . Anxiety   . MS (multiple sclerosis)     Followed by Dr. Sandria Manly  . High cholesterol   . Aortic aneurysm   . Complication of anesthesia   . PONV (postoperative nausea and vomiting)   . Aortic aneurysm   . Numbness     "different spots"  . Arthritis   . History of ulcer disease 30 yrs ago  . IBS (irritable bowel syndrome)   . Incontinence in female   . Obstructive sleep apnea     not currently using c pap   Past Surgical History  Procedure Laterality Date  . Cholecystectomy    . Hysterectomy    . Nasal sinus surgery    . Left eye surgery    . Bilateral foot surgery    . Leg surgery  2006    due to fx leg   . Back surgery    . Total knee arthroplasty Left 04/11/2014    Procedure: LEFT TOTAL KNEE ARTHROPLASTY;  Surgeon: Shelda Pal, MD;  Location: [facility];  Service: Orthopedics;  Laterality: Left;   Social History:  reports that she has  been smoking Cigarettes.  She has a 15 pack-year smoking history. She has never used smokeless tobacco. She reports that she drinks alcohol. She reports that she does not use illicit drugs.  Allergies  Allergen Reactions  . Codeine Other (See Comments)    Real bad chest pains  . Demerol Nausea Only  . Lisinopril Cough  . Penicillins Rash    Pt not sure "chestpains"    No family history on file.   Prior to Admission medications   Medication Sig Start Date End Date Taking? Authorizing Provider  atorvastatin (LIPITOR) 40 MG tablet Take 40 mg by mouth at bedtime.     Historical Provider, MD  buPROPion (WELLBUTRIN XL) 300 MG 24 hr tablet Take 1 tablet by  mouth at bedtime. 01/17/14   Historical Provider, MD  Calcium Carbonate-Vitamin D (CALCIUM + D PO) Take 1 tablet by mouth 2 (two) times daily.     Historical Provider, MD  Cholecalciferol (VITAMIN D) 2000 UNITS tablet Take 4,000 Units by mouth 2 (two) times daily.     Historical Provider, MD  clonazePAM (KLONOPIN) 1 MG tablet Take 1 tablet (1 mg total) by mouth 3 (three) times daily as needed for anxiety. 04/13/14   Genelle GatherMatthew Scott Babish, PA-C  Cyanocobalamin (VITAMIN B 12 PO) Take 20,000-25,000 mcg by mouth every morning.    Historical Provider, MD  diphenoxylate-atropine (LOMOTIL) 2.5-0.025 MG per tablet Take 3 tablets by mouth 2 (two) times daily as needed for diarrhea or loose stools.    Historical Provider, MD  docusate sodium 100 MG CAPS Take 100 mg by mouth 2 (two) times daily. 04/13/14   Genelle GatherMatthew Scott Babish, PA-C  ferrous sulfate 325 (65 FE) MG tablet Take 1 tablet (325 mg total) by mouth 3 (three) times daily after meals. 04/13/14   Genelle GatherMatthew Scott Babish, PA-C  FIBER PO Take 1 tablet by mouth every morning.    Historical Provider, MD  FLUoxetine (PROZAC) 40 MG capsule Take 40 mg by mouth every morning.     Historical Provider, MD  gabapentin (NEURONTIN) 100 MG capsule Take 100-200 mg by mouth every 8 (eight) hours.    Historical Provider, MD  HYDROcodone-acetaminophen (NORCO) 7.5-325 MG per tablet Take one tablet by mouth every 4 hours as needed for mild to moderate pain; Take two tablets by mouth every 4 hours as needed for moderate to severe pain 04/14/14   Tiffany L Reed, DO  levothyroxine (SYNTHROID, LEVOTHROID) 200 MCG tablet Take 200 mcg by mouth daily before breakfast.    Historical Provider, MD  losartan (COZAAR) 100 MG tablet Take 1 tablet by mouth every morning.  10/15/11   Historical Provider, MD  oxyCODONE-acetaminophen (PERCOCET/ROXICET) 5-325 MG per tablet Take 2 tablets by mouth every 4 (four) hours as needed. 05/14/14   Rolland PorterMark James, MD  polyethylene glycol [facility](MIRALAX / Ethelene HalGLYCOLAX)  packet Take 17 g by mouth 2 (two) times daily. 04/13/14   Genelle GatherMatthew Scott Babish, PA-C  tiotropium (SPIRIVA) 18 MCG inhalation capsule Place 18 mcg into inhaler and inhale daily as needed (shortness of breath.).  10/11/10   Waymon Budgelinton D Young, MD  tiZANidine (ZANAFLEX) 4 MG tablet Take 1 tablet (4 mg total) by mouth every 6 (six) hours as needed for muscle spasms. Takes one tablet (4mg ) in the early afternoon and three tablets (12mg ) at bedtime Patient taking differently: Take 4-12 mg by mouth 2 (two) times daily. Takes one tablet (4mg ) in the early afternoon and three tablets (12mg ) at bedtime 04/13/14   Genelle GatherMatthew Scott ArlingtonBabish,  PA-C   Physical Exam: Filed Vitals:   05/17/14 1254 05/17/14 1315 05/17/14 1345 05/17/14 1430  BP: 120/75 118/76 108/72 105/62  Pulse: 51 50 51 51  Temp:      TempSrc:      Resp: 17 15 21 21   SpO2: 98% 98% 97% 99%    Wt Readings from Last 3 Encounters:  04/21/14 108.41 kg (239 lb)  04/11/14 107.502 kg (237 lb)  04/05/14 107.502 kg (237 lb)    General:  Obtunded, sleeping, arouses to verbal command and then immediately falls sleep Eyes: PERRL, normal lids, irises & conjunctiva ENT: grossly normal hearing, lips & tongue Neck: no LAD, masses or thyromegaly Cardiovascular: RRR, no m/r/g. No LE edema. Respiratory: CTA bilaterally, no w/r/r. Normal respiratory effort. Abdomen: soft, ntnd Skin: no rash or induration seen on limited exam Musculoskeletal: L knee incision healing well, R ankle with brace Psychiatric: unable to assess Neurologic: obtunded, moves all extremities, no localising signs.          Labs on Admission:  Basic Metabolic Panel:  Recent Labs Lab 05/17/14 1102  NA 136*  K 4.8  CL 98  CO2 23  GLUCOSE 212*  BUN 18  CREATININE 0.78  CALCIUM 9.1   Liver Function Tests:  Recent Labs Lab 05/17/14 1102  AST 37  ALT 37*  ALKPHOS 124*  BILITOT 0.4  PROT 7.1  ALBUMIN 3.1*   No results for input(s): LIPASE, AMYLASE in the last 168  hours. No results for input(s): AMMONIA in the last 168 hours. CBC:  Recent Labs Lab 05/17/14 1102  WBC 12.8*  NEUTROABS 9.3*  HGB 13.6  HCT 41.7  MCV 92.5  PLT 353   Cardiac Enzymes:  Recent Labs Lab 05/17/14 1102  TROPONINI <0.30    BNP (last 3 results) No results for input(s): PROBNP in the last 8760 hours. CBG: No results for input(s): GLUCAP in the last 168 hours.  Radiological Exams on Admission: Ct Head Wo Contrast  05/17/2014   CLINICAL DATA:  Patient found unresponsive on the ground earlier today by her daughter. Patient bradycardic with pinpoint pupils upon EMS arrival. Patient required mask and bag ventilation assistance.  EXAM: CT HEAD WITHOUT CONTRAST  TECHNIQUE: Contiguous axial images were obtained from the base of the skull through the vertex without intravenous contrast.  COMPARISON:  MRI brain 09/18/2010.  CT head 12/14/2007.  FINDINGS: Slight head tilt in the gantry accounts for apparent asymmetry in the cerebral hemispheres. Ventricular system normal in size and appearance for age. Severe changes of small vessel disease of the white matter diffusely, progressive since 2009. No evidence of significant atrophy. No mass lesion. No midline shift. No acute hemorrhage or hematoma. No extra-axial fluid collections. No evidence of acute infarction.  No skull fracture or other focal osseous abnormality involving the skull. Minimal mucosal thickening involving the left maxillary sinus and scattered bilateral ethmoid air cells. Remaining visualized paranasal sinuses, bilateral mastoid air cells and bilateral middle ear cavities well-aerated. Partial empty sella. Moderate to severe bilateral carotid siphon and vertebral artery atherosclerosis.  IMPRESSION: 1. No acute intracranial abnormality. 2. Severe chronic microvascular ischemic changes of the white matter diffusely, progressive since 2009.   Electronically Signed   By: Hulan Saas M.D.   On: 05/17/2014 13:07   Dg  Chest Portable 1 View  05/17/2014   CLINICAL DATA:  Bradycardia and altered mental status  EXAM: PORTABLE CHEST - 1 VIEW  COMPARISON:  April 05, 2014  FINDINGS: There is no appreciable  edema or consolidation. Heart is upper normal in size with pulmonary vascularity within normal limits. No adenopathy. No bone lesions.  IMPRESSION: No edema or consolidation.   Electronically Signed   By: Bretta Bang M.D.   On: 05/17/2014 11:36    EKG: Independently reviewed. RBBB, no acute ST T changes  Assessment/Plan    Encephalopathy acute -multifactorial -polypharmacy, UTI, ? CO2 narcosis-from OSA/OHS -admit to SDU -check ABG stat -continue narcan drip, hold sedatives-vicodin/percocet/zanaflex/klonopin -Levaquin for UTI -FU urine Cx -Head Ct unremarkable and exam non focal -if doesn't improve, need to consider MRI    Obstructive sleep apnea -unclear if she uses CPAP -ABg pending    S/P left TKA -Pt when able, hold narcotics      Depression -hold wellbutrin   chronic back pain -hold narcotics, on narcan gtt    Recent Nondisplaced oblique distal right fibular metaphyseal fracture. -following recent fall, in brace -FU with Orthopedics  Code Status: Presumed Full COde DVT Prophylaxis: lovenox Family Communication: none at bedside,  D/w Hollie Salk via telephone Disposition Plan: admit to SDU  Time spent:  [facility] Pager [phone]

## 2014-05-17 NOTE — ED Notes (Signed)
Pharmacy notified to send another bag of narcan.

## 2014-05-17 NOTE — Progress Notes (Signed)
Chaplain responded to page to support  patient who came to ED via [facility] from home unresponsive. Per daughter pt was  last seen normal at 0815. Doctor said he spoke with a daughterLinton Rump Raquel James) over phone who lives out of town and gave her update on pt. status.  Ms. Raquel James will not be coming to hospital but a daughter who lives locally in route to ED.  Pt. began to wake up and started to talk a little around 12.50pm.  Pt. was moved to B19.  Provided support to staff and prayed for pt.  Will follow as needed.   05/17/14 1200  Clinical Encounter Type  Visited With Patient;Health care provider  Visit Type Initial;Spiritual support;ED;Trauma  Referral From Nurse  Spiritual Encounters  Spiritual Needs Emotional  Venida Jarvis, Chaplain,pager [phone]

## 2014-05-17 NOTE — ED Provider Notes (Signed)
CSN: [id number]     Arrival date & time 05/17/14  1049 History   First MD Initiated Contact with Patient 05/17/14 1058     Chief Complaint  Patient presents with  . unresponsive   . Altered Mental Status     (Consider location/radiation/quality/duration/timing/severity/associated sxs/prior Treatment) HPI   73yF brought in by EMS. Found by daughter poorly responsive laying in her bed shortly before arrival. Last seen normal about 0815No obvious acute trauma. Recently diagnosed fibula fx and has been taking more pain medicine. Some concern she may be overmedicating. Had to be bagged by EMS but some of her respiratory effort.   Past Medical History  Diagnosis Date  . HTN (hypertension)   . Ascending aortic aneurysm   . Dyslipidemia   . Tobacco dependence   . Hypothyroidism   . Depression   . Neurogenic bladder   . Obesity   . Dog bite(E906.0)     RLL with systemic inflammatory response  . Chronic low back pain   . Anxiety   . MS (multiple sclerosis)     Followed by Dr. Sandria ManlyLove  . High cholesterol   . Aortic aneurysm   . Complication of anesthesia   . PONV (postoperative nausea and vomiting)   . Aortic aneurysm   . Numbness     "different spots"  . Arthritis   . History of ulcer disease 30 yrs ago  . IBS (irritable bowel syndrome)   . Incontinence in female   . Obstructive sleep apnea     not currently using c pap   Past Surgical History  Procedure Laterality Date  . Cholecystectomy    . Hysterectomy    . Nasal sinus surgery    . Left eye surgery    . Bilateral foot surgery    . Leg surgery  2006    due to fx leg   . Back surgery    . Total knee arthroplasty Left 04/11/2014    Procedure: LEFT TOTAL KNEE ARTHROPLASTY;  Surgeon: Shelda PalMatthew D Olin, MD;  Location: [facility];  Service: Orthopedics;  Laterality: Left;   No family history on file. History  Substance Use Topics  . Smoking status: Current Every Day Smoker -- 0.50 packs/day for 30 years    Types: Cigarettes   . Smokeless tobacco: Never Used  . Alcohol Use: Yes     Comment: Rare alcohol use   OB History    No data available     Review of Systems  All systems reviewed and negative, other than as noted in HPI.   Allergies  Codeine; Demerol; Lisinopril; and Penicillins  Home Medications   Prior to Admission medications   Medication Sig Start Date End Date Taking? Authorizing Provider  atorvastatin (LIPITOR) 40 MG tablet Take 40 mg by mouth at bedtime.     Historical Provider, MD  buPROPion (WELLBUTRIN XL) 300 MG 24 hr tablet Take 1 tablet by mouth at bedtime. 01/17/14   Historical Provider, MD  Calcium Carbonate-Vitamin D (CALCIUM + D PO) Take 1 tablet by mouth 2 (two) times daily.     Historical Provider, MD  Cholecalciferol (VITAMIN D) 2000 UNITS tablet Take 4,000 Units by mouth 2 (two) times daily.     Historical Provider, MD  clonazePAM (KLONOPIN) 1 MG tablet Take 1 tablet (1 mg total) by mouth 3 (three) times daily as needed for anxiety. 04/13/14   Genelle GatherMatthew Scott Babish, PA-C  Cyanocobalamin (VITAMIN B 12 PO) Take 20,000-25,000 mcg by mouth every morning.  Historical Provider, MD  diphenoxylate-atropine (LOMOTIL) 2.5-0.025 MG per tablet Take 3 tablets by mouth 2 (two) times daily as needed for diarrhea or loose stools.    Historical Provider, MD  docusate sodium 100 MG CAPS Take 100 mg by mouth 2 (two) times daily. 04/13/14   Genelle Gather Babish, PA-C  ferrous sulfate 325 (65 FE) MG tablet Take 1 tablet (325 mg total) by mouth 3 (three) times daily after meals. 04/13/14   Genelle Gather Babish, PA-C  FIBER PO Take 1 tablet by mouth every morning.    Historical Provider, MD  FLUoxetine (PROZAC) 40 MG capsule Take 40 mg by mouth every morning.     Historical Provider, MD  gabapentin (NEURONTIN) 100 MG capsule Take 100-200 mg by mouth every 8 (eight) hours.    Historical Provider, MD  HYDROcodone-acetaminophen (NORCO) 7.5-325 MG per tablet Take one tablet by mouth every 4 hours as needed  for mild to moderate pain; Take two tablets by mouth every 4 hours as needed for moderate to severe pain 04/14/14   Tiffany L Reed, DO  levothyroxine (SYNTHROID, LEVOTHROID) 200 MCG tablet Take 200 mcg by mouth daily before breakfast.    Historical Provider, MD  losartan (COZAAR) 100 MG tablet Take 1 tablet by mouth every morning.  10/15/11   Historical Provider, MD  oxyCODONE-acetaminophen (PERCOCET/ROXICET) 5-325 MG per tablet Take 2 tablets by mouth every 4 (four) hours as needed. 05/14/14   Rolland Porter, MD  polyethylene glycol [facility] Ethelene Hal) packet Take 17 g by mouth 2 (two) times daily. 04/13/14   Genelle Gather Babish, PA-C  tiotropium (SPIRIVA) 18 MCG inhalation capsule Place 18 mcg into inhaler and inhale daily as needed (shortness of breath.).  10/11/10   Waymon Budge, MD  tiZANidine (ZANAFLEX) 4 MG tablet Take 1 tablet (4 mg total) by mouth every 6 (six) hours as needed for muscle spasms. Takes one tablet (4mg ) in the early afternoon and three tablets (12mg ) at bedtime Patient taking differently: Take 4-12 mg by mouth 2 (two) times daily. Takes one tablet (4mg ) in the early afternoon and three tablets (12mg ) at bedtime 04/13/14   Genelle Gather Babish, PA-C   BP 112/84 mmHg  Pulse 51  Temp(Src) 96.5 F (35.8 C) (Temporal)  Resp 24  SpO2 100% Physical Exam  Constitutional: She appears well-developed and well-nourished. No distress.  HENT:  Head: Normocephalic and atraumatic.  Eyes: Conjunctivae are normal. Right eye exhibits no discharge. Left eye exhibits no discharge.  Pinpoint pupils. Equal.  Neck: Neck supple.  Cardiovascular: Regular rhythm and normal heart sounds.  Exam reveals no gallop and no friction rub.   No murmur heard. bradycardia  Pulmonary/Chest: Effort normal and breath sounds normal. No respiratory distress.  Abdominal: Soft. She exhibits no distension. There is no tenderness.  Musculoskeletal: She exhibits no edema or tenderness.  RLE in cam walker   Neurological:  On arrival pt with GCS 8. With narcan improves to 13. Will follow simple commands. No focal motor deficit.   Skin: Skin is warm and dry.  Psychiatric: She has a normal mood and affect. Her behavior is normal. Thought content normal.  Nursing note and vitals reviewed.   ED Course  Procedures (including critical care time)  CRITICAL CARE Performed by: Raeford Razor   Total critical care time: 30 minutes  Critical care time was exclusive of separately billable procedures and treating other patients. Critical care was necessary to treat or prevent imminent or life-threatening deterioration. Critical care was time spent  personally by me on the following activities: development of treatment plan with patient and/or surrogate as well as nursing, discussions with consultants, evaluation of patient's response to treatment, examination of patient, obtaining history from patient or surrogate, ordering and performing treatments and interventions, ordering and review of laboratory studies, ordering and review of radiographic studies, pulse oximetry and re-evaluation of patient's condition.  Labs Review Labs Reviewed  CBC WITH DIFFERENTIAL - Abnormal; Notable for the following:    WBC 12.8 (*)    Neutro Abs 9.3 (*)    All other components within normal limits  COMPREHENSIVE METABOLIC PANEL - Abnormal; Notable for the following:    Sodium 136 (*)    Glucose, Bld 212 (*)    Albumin 3.1 (*)    ALT 37 (*)    Alkaline Phosphatase 124 (*)    GFR calc non Af Amer 83 (*)    All other components within normal limits  LACTIC ACID, PLASMA - Abnormal; Notable for the following:    Lactic Acid, Venous 2.4 (*)    All other components within normal limits  URINALYSIS, ROUTINE W REFLEX MICROSCOPIC - Abnormal; Notable for the following:    APPearance CLOUDY (*)    Hgb urine dipstick TRACE (*)    Nitrite POSITIVE (*)    Leukocytes, UA MODERATE (*)    All other components within normal  limits  URINE MICROSCOPIC-ADD ON - Abnormal; Notable for the following:    Bacteria, UA MANY (*)    All other components within normal limits  CBC - Abnormal; Notable for the following:    WBC 15.1 (*)    All other components within normal limits  COMPREHENSIVE METABOLIC PANEL - Abnormal; Notable for the following:    Glucose, Bld 111 (*)    Albumin 3.0 (*)    GFR calc non Af Amer 73 (*)    GFR calc Af Amer 85 (*)    All other components within normal limits  HEMOGLOBIN A1C - Abnormal; Notable for the following:    Hgb A1c MFr Bld 6.4 (*)    Mean Plasma Glucose 137 (*)    All other components within normal limits  CBC - Abnormal; Notable for the following:    WBC 14.0 (*)    Platelets 401 (*)    All other components within normal limits  COMPREHENSIVE METABOLIC PANEL - Abnormal; Notable for the following:    Potassium 3.4 (*)    Glucose, Bld 109 (*)    Albumin 3.1 (*)    AST 159 (*)    ALT 125 (*)    Alkaline Phosphatase 138 (*)    Total Bilirubin 1.9 (*)    GFR calc non Af Amer 73 (*)    GFR calc Af Amer 85 (*)    All other components within normal limits  CBC - Abnormal; Notable for the following:    WBC 13.2 (*)    All other components within normal limits  COMPREHENSIVE METABOLIC PANEL - Abnormal; Notable for the following:    Albumin 2.9 (*)    AST 115 (*)    ALT 200 (*)    Alkaline Phosphatase 168 (*)    GFR calc non Af Amer 66 (*)    GFR calc Af Amer 76 (*)    All other components within normal limits  I-STAT ARTERIAL BLOOD GAS, ED - Abnormal; Notable for the following:    pCO2 arterial 34.1 (*)    pO2, Arterial 77.0 (*)    All other  components within normal limits  URINE CULTURE  MRSA PCR SCREENING  TROPONIN I  TSH  MAGNESIUM  HEPATITIS B SURFACE ANTIGEN  HEPATITIS C ANTIBODY  I-STAT CG4 LACTIC ACID, ED    Imaging Review No results found.   Ct Head Wo Contrast  05/17/2014   CLINICAL DATA:  Patient found unresponsive on the ground earlier today by  her daughter. Patient bradycardic with pinpoint pupils upon EMS arrival. Patient required mask and bag ventilation assistance.  EXAM: CT HEAD WITHOUT CONTRAST  TECHNIQUE: Contiguous axial images were obtained from the base of the skull through the vertex without intravenous contrast.  COMPARISON:  MRI brain 09/18/2010.  CT head 12/14/2007.  FINDINGS: Slight head tilt in the gantry accounts for apparent asymmetry in the cerebral hemispheres. Ventricular system normal in size and appearance for age. Severe changes of small vessel disease of the white matter diffusely, progressive since 2009. No evidence of significant atrophy. No mass lesion. No midline shift. No acute hemorrhage or hematoma. No extra-axial fluid collections. No evidence of acute infarction.  No skull fracture or other focal osseous abnormality involving the skull. Minimal mucosal thickening involving the left maxillary sinus and scattered bilateral ethmoid air cells. Remaining visualized paranasal sinuses, bilateral mastoid air cells and bilateral middle ear cavities well-aerated. Partial empty sella. Moderate to severe bilateral carotid siphon and vertebral artery atherosclerosis.  IMPRESSION: 1. No acute intracranial abnormality. 2. Severe chronic microvascular ischemic changes of the white matter diffusely, progressive since 2009.   Electronically Signed   By: Hulan Saas M.D.   On: 05/17/2014 13:07   Dg Chest Portable 1 View  05/17/2014   CLINICAL DATA:  Bradycardia and altered mental status  EXAM: PORTABLE CHEST - 1 VIEW  COMPARISON:  April 05, 2014  FINDINGS: There is no appreciable edema or consolidation. Heart is upper normal in size with pulmonary vascularity within normal limits. No adenopathy. No bone lesions.  IMPRESSION: No edema or consolidation.   Electronically Signed   By: Bretta Bang M.D.   On: 05/17/2014 11:36    EKG Interpretation   Date/Time:  Tuesday May 17 2014 10:54:25 EST Ventricular Rate:  54 PR  Interval:  184 QRS Duration: 132 QT Interval:  489 QTC Calculation: 463 R Axis:   1 Text Interpretation:  Sinus rhythm Right bundle branch block Borderline ST  elevation, lateral leads ED PHYSICIAN INTERPRETATION AVAILABLE IN [place]  [facility] Confirmed by TEST, Record ([id number]) on 05/19/2014 9:06:55 AM      MDM   Final diagnoses:  Altered consciousness  Altered level of consciousness    72yF found down in bed. Last seen in bed 1 hour prior to being found by daughter. Requiring assisted respirations with BVM by EMS. Clinically opiate overdose. Clinical response to naloxone. Needing repeat dosing and ultimatley started on narcan gtt. Remains drowsy but now protecting airway and follows commands. Will admit for further tx.    Raeford Razor, MD 05/25/14 [phone]

## 2014-05-17 NOTE — ED Notes (Signed)
To ED via [facility] from Home with c/o found unresponsive per daughter this am, last seen normal at 0815. On EMS arrival--bradycardic, pupils pinpoint, IV started-- 20G in left AC-- Narcan 6mg  given with some response, resp less shallow-- on arrival to ED, pt was being bagged, some resp effort on own.

## 2014-05-17 NOTE — ED Notes (Signed)
Pt woke up and was talking in full sentences. Asking what happened. Pt stated her name and the month. Pt fell back asleep and is sleeping soundly.

## 2014-05-17 NOTE — ED Notes (Signed)
Stopped Narcan per MD order. Patient wakes up and follows commands. Remains on the cardiac monitor.

## 2014-05-17 NOTE — Progress Notes (Signed)
RT called to Trauma A, RN stated that EMS was assisting with respirations. When I arrived, patient had been given narcan and was starting to wake up. Breathing on her own at a rate of around 20. Patinet placed on 100% NRB, SAT 100%. RT to monitor as needed.

## 2014-05-18 LAB — URINE CULTURE
COLONY COUNT: NO GROWTH
Culture: NO GROWTH

## 2014-05-18 LAB — COMPREHENSIVE METABOLIC PANEL
ALT: 29 U/L (ref 0–35)
AST: 20 U/L (ref 0–37)
Albumin: 3 g/dL — ABNORMAL LOW (ref 3.5–5.2)
Alkaline Phosphatase: 112 U/L (ref 39–117)
Anion gap: 12 (ref 5–15)
BUN: 12 mg/dL (ref 6–23)
CALCIUM: 9.4 mg/dL (ref 8.4–10.5)
CO2: 25 mEq/L (ref 19–32)
CREATININE: 0.8 mg/dL (ref 0.50–1.10)
Chloride: 101 mEq/L (ref 96–112)
GFR, EST AFRICAN AMERICAN: 85 mL/min — AB (ref >90)
GFR, EST NON AFRICAN AMERICAN: 73 mL/min — AB (ref >90)
GLUCOSE: 111 mg/dL — AB (ref 70–99)
Potassium: 4.7 mEq/L (ref 3.7–5.3)
Sodium: 138 mEq/L (ref 137–147)
Total Bilirubin: 0.7 mg/dL (ref 0.3–1.2)
Total Protein: 6.5 g/dL (ref 6.0–8.3)

## 2014-05-18 LAB — CBC
HCT: 40.3 % (ref 36.0–46.0)
HEMOGLOBIN: 12.7 g/dL (ref 12.0–15.0)
MCH: 28.6 pg (ref 26.0–34.0)
MCHC: 31.5 g/dL (ref 30.0–36.0)
MCV: 90.8 fL (ref 78.0–100.0)
Platelets: 354 10*3/uL (ref 150–400)
RBC: 4.44 MIL/uL (ref 3.87–5.11)
RDW: 14.1 % (ref 11.5–15.5)
WBC: 15.1 10*3/uL — AB (ref 4.0–10.5)

## 2014-05-18 LAB — TSH: TSH: 0.932 u[IU]/mL (ref 0.350–4.500)

## 2014-05-18 LAB — HEMOGLOBIN A1C
HEMOGLOBIN A1C: 6.4 % — AB (ref <5.7)
MEAN PLASMA GLUCOSE: 137 mg/dL — AB (ref <117)

## 2014-05-18 MED ORDER — CLONAZEPAM 0.5 MG PO TABS
0.2500 mg | ORAL_TABLET | Freq: Three times a day (TID) | ORAL | Status: DC | PRN
  Administered 2014-05-21: 0.25 mg via ORAL
  Filled 2014-05-18: qty 1

## 2014-05-18 MED ORDER — VITAMIN D 50 MCG (2000 UT) PO TABS: 4000.0000 [IU] | ORAL_TABLET | Freq: Two times a day (BID) | ORAL | Status: DC

## 2014-05-18 MED ORDER — HYDROCODONE-ACETAMINOPHEN 5-325 MG PO TABS
1.0000 | ORAL_TABLET | Freq: Four times a day (QID) | ORAL | Status: DC | PRN
Start: 2014-05-18 — End: 2014-05-21
  Administered 2014-05-19: 2 via ORAL
  Filled 2014-05-18 (×2): qty 2

## 2014-05-18 MED ORDER — BUPROPION HCL ER (XL) 300 MG PO TB24
300.0000 mg | ORAL_TABLET | Freq: Every day | ORAL | Status: DC
  Administered 2014-05-18 – 2014-05-20 (×3): 300 mg via ORAL
  Filled 2014-05-18 (×4): qty 1

## 2014-05-18 MED ORDER — LEVOTHYROXINE SODIUM 200 MCG PO TABS
200.0000 ug | ORAL_TABLET | Freq: Every day | ORAL | Status: DC
  Administered 2014-05-19 – 2014-05-21 (×3): 200 ug via ORAL
  Filled 2014-05-18 (×4): qty 1

## 2014-05-18 MED ORDER — POLYETHYLENE GLYCOL 3350 17 G PO PACK
17.0000 g | PACK | Freq: Two times a day (BID) | ORAL | Status: DC
  Administered 2014-05-18 – 2014-05-21 (×6): 17 g via ORAL
  Filled 2014-05-18 (×8): qty 1

## 2014-05-18 MED ORDER — OXYCODONE HCL 5 MG PO TABS
5.0000 mg | ORAL_TABLET | Freq: Four times a day (QID) | ORAL | Status: DC | PRN
  Administered 2014-05-18 – 2014-05-21 (×5): 10 mg via ORAL
  Filled 2014-05-18 (×5): qty 2

## 2014-05-18 MED ORDER — TIOTROPIUM BROMIDE MONOHYDRATE 18 MCG IN CAPS
18.0000 ug | ORAL_CAPSULE | Freq: Every day | RESPIRATORY_TRACT | Status: DC | PRN
  Filled 2014-05-18: qty 5

## 2014-05-18 MED ORDER — DOCUSATE SODIUM 100 MG PO CAPS
100.0000 mg | ORAL_CAPSULE | Freq: Two times a day (BID) | ORAL | Status: DC
  Administered 2014-05-18 – 2014-05-21 (×7): 100 mg via ORAL
  Filled 2014-05-18 (×10): qty 1

## 2014-05-18 NOTE — Progress Notes (Signed)
Utilization review completed. Javaeh Muscatello, RN, BSN. 

## 2014-05-18 NOTE — Plan of Care (Signed)
Problem: Phase II Progression Outcomes Goal: Obtain order to discontinue catheter if appropriate Outcome: Not Applicable Date Met:  43/83/65

## 2014-05-18 NOTE — Progress Notes (Addendum)
[place] TEAM 1 - Stepdown/ICU TEAM Progress Note  [name] CounterLinda Karam EXB:[id number] DOB: 05-27-1941 DOA: 05/17/2014 PCP: Ezequiel KayserPERINI,MARK A, MD  Admit HPI / Brief Narrative: 73 yo female with hx of Anxiety, Depression, Tobacco use, OSA, and chronic back pain who was brought to the ER by EMS with decreased responsiveness.  She had a L TKA April 11, 2014, went to rehab for 2 weeks, and then went home.  She was seen in there ER 3 days prior to this admit following a fall leading to a nondisplaced R fib Fx for which she was advised to FU with Orthopedics.  The morning of her admit she was found unresponsive by her daughter and brought to the ER.  The previous day her daughter noticed that she had a period of lethargy and decreased responsiveness during the daytime which improved.  In ER the pt had pin point pupils, and was poorly responsive.  She briefly improved with narcan and hence was started on a narcan gtt.  HPI/Subjective: Pt is alert and conversant.  She denies cp or sob.  She does not remember the events of yesterday.  She c/o pain in her back, knee, and lower R leg.    Assessment/Plan:  Encephalopathy acute Due to polypharmacy, UTI, ?CO2 narcosis from OSA/OHS - now off narcan gtt - holding zanaflex/klonopin - attempt to resume low dose pain med for legitimate pain - Head Ct unremarkable and exam non focal - resume very low dose benzo to prevent withdrawal   UTI Empiric Levaquin - FU urine Cx  Obstructive sleep apnea unclear if she complies w/ CPAP chronically - follow sats - attempt to resume home CPAP   S/P left TKA  PT/OT - followed by Dr. Charlann Boxerlin ([facility])   Depression Resume wellbutrin  Chronic back pain Resume low dose narcotic  Recent Nondisplaced oblique distal right fibular metaphyseal fracture following recent fall - in brace - FU with Orthopedics  Hyperglycemia  No reported hx of DM - possibly due to acute stress - check A1c  HTN BP reasonably controlled at  present   HLD Resume usual tx   Hypothyroidism Check TSH in setting of altered mental status - cont home synthroid dose for now   Obesity - Body mass index is 36.17 kg/(m^2).  Code Status: FULL Family Communication: no family present at time of exam Disposition Plan: transfer to ortho med bed - begin PT/OT   Consultants: none  Procedures: none  Antibiotics: Cipro 11/24 Levaquin 11/24 >  DVT prophylaxis: lovenox   Objective: Blood pressure 135/92, pulse 66, temperature 98.5 F (36.9 C), temperature source Oral, resp. rate 19, height 5\' 7"  (1.702 m), weight 104.781 kg (231 lb), SpO2 98 %.  Intake/Output Summary (Last 24 hours) at 05/18/14 1019 Last data filed at 05/18/14 0600  Gross per 24 hour  Intake 1338.75 ml  Output   1045 ml  Net 293.75 ml   Exam: General: No acute respiratory distress Lungs: Clear to auscultation bilaterally without wheezes or crackles Cardiovascular: Regular rate and rhythm without murmur gallop or rub normal S1 and S2 Abdomen: Nontender, nondistended, soft, bowel sounds positive, no rebound, no ascites, no appreciable mass Extremities: No significant cyanosis, clubbing, or edema bilateral lower extremities - R LE in cam-walker type splint   Data Reviewed: Basic Metabolic Panel:  Recent Labs Lab 05/17/14 1102 05/18/14 0401  NA 136* 138  K 4.8 4.7  CL 98 101  CO2 23 25  GLUCOSE 212* 111*  BUN 18 12  CREATININE  0.78 0.80  CALCIUM 9.1 9.4    Liver Function Tests:  Recent Labs Lab 05/17/14 1102 05/18/14 0401  AST 37 20  ALT 37* 29  ALKPHOS 124* 112  BILITOT 0.4 0.7  PROT 7.1 6.5  ALBUMIN 3.1* 3.0*   CBC:  Recent Labs Lab 05/17/14 1102 05/18/14 0401  WBC 12.8* 15.1*  NEUTROABS 9.3*  --   HGB 13.6 12.7  HCT 41.7 40.3  MCV 92.5 90.8  PLT 353 354    Cardiac Enzymes:  Recent Labs Lab 05/17/14 1102  TROPONINI <0.30    Recent Results (from the past 240 hour(s))  MRSA PCR Screening     Status: None    Collection Time: 05/17/14  4:53 PM  Result Value Ref Range Status   MRSA by PCR NEGATIVE NEGATIVE Final    Comment:        The GeneXpert MRSA Assay (FDA approved for NASAL specimens only), is one component of a comprehensive MRSA colonization surveillance program. It is not intended to diagnose MRSA infection nor to guide or monitor treatment for MRSA infections.      Studies:  Recent x-ray studies have been reviewed in detail by the Attending Physician  Scheduled Meds:  Scheduled Meds: . atorvastatin  40 mg Oral QHS  . docusate sodium  100 mg Oral BID  . enoxaparin (LOVENOX) injection  50 mg Subcutaneous Q24H  . FLUoxetine  40 mg Oral Daily  . levofloxacin (LEVAQUIN) IV  500 mg Intravenous Q24H  . [START ON 05/19/2014] levothyroxine  200 mcg Oral QAC breakfast  . polyethylene glycol  17 g Oral BID    Time spent on care of this patient: 35 mins   Katharina Jehle T , MD   [facility] Office  [phone] Pager - Text Page per Loretha Stapler as per below:  On-Call/Text Page:      [email or web address]      password [email or web address]  If 7PM-7AM, please contact night-coverage [email or web address] Password [email or web address] 05/18/2014, 10:19 AM   LOS: 1 day

## 2014-05-18 NOTE — Evaluation (Signed)
Physical Therapy Evaluation Patient Details Name: Sharen CounterLinda Kock MRN: [id number] DOB: June 14, 1941 Today's Date: 05/18/2014   History of Present Illness  Pt adm with acute encephalopathy. Pt with lt TKA ~1 month ago and with rt fibular fx and bruising of lt ribs several days ago. PMH - anxiety, depression, chronic back pain, obesity  Clinical Impression  Pt admitted with above. Pt currently with functional limitations due to the deficits listed below (see PT Problem List).  Pt will benefit from skilled PT to increase their independence and safety with mobility to allow discharge to the venue listed below. Pt agreeable to return to ST- SNF.      Follow Up Recommendations SNF    Equipment Recommendations  None recommended by PT    Recommendations for Other Services       Precautions / Restrictions Precautions Precautions: Fall Required Braces or Orthoses: Other Brace/Splint Other Brace/Splint: Cam boot on rt leg Restrictions Weight Bearing Restrictions: Yes RLE Weight Bearing: Weight bearing as tolerated LLE Weight Bearing: Weight bearing as tolerated Other Position/Activity Restrictions: CAM boot on rt leg      Mobility  Bed Mobility Overal bed mobility: Needs Assistance Bed Mobility: Supine to Sit;Sit to Supine     Supine to sit: +2 for physical assistance;Mod assist;HOB elevated Sit to supine: +2 for physical assistance;Max assist   General bed mobility comments: Assist to bring legs off of bed and to bring trunk up into sitting. Assist to control descent of trunk and to brind legs back up into bed when returning to supine.  Transfers Overall transfer level: Needs assistance Equipment used: Rolling walker (2 wheeled) Transfers: Sit to/from Stand Sit to Stand: +2 physical assistance;Max assist         General transfer comment: Assist to bring hips up. Verbal/tactile cues to extend hip, knees, and trunk. Pt stood x 2 for 60-90 sec. Pt remained with flexed posture and  steps or pivot to chair not attempted. Attempted use of Huntley DecSara Plus for transfer to chair but Huntley DecSara Plus not working.   Ambulation/Gait                Stairs            Wheelchair Mobility    Modified Rankin (Stroke Patients Only)       Balance Overall balance assessment: Needs assistance Sitting-balance support: No upper extremity supported;Feet supported Sitting balance-Leahy Scale: Fair     Standing balance support: Bilateral upper extremity supported Standing balance-Leahy Scale: Poor Standing balance comment: walker and mod a to maintain static standing in flexed posture.                             Pertinent Vitals/Pain Pain Assessment: Faces Faces Pain Scale: Hurts whole lot Pain Location: lt ribs Pain Descriptors / Indicators: Grimacing;Guarding Pain Intervention(s): Limited activity within patient's tolerance;Monitored during session;Repositioned    Home Living Family/patient expects to be discharged to:: Skilled nursing facility                      Prior Function           Comments: Pt was amb with walker after ST-SNF but can't remember if she has amb since she broke rt fibula.     Hand Dominance        Extremity/Trunk Assessment   Upper Extremity Assessment: Defer to OT evaluation           Lower  Extremity Assessment: Generalized weakness;RLE deficits/detail;LLE deficits/detail RLE Deficits / Details: ankle/foot in CAM boot LLE Deficits / Details: knee flex >90 degrees     Communication   Communication: No difficulties  Cognition Arousal/Alertness: Awake/alert Behavior During Therapy: WFL for tasks assessed/performed Overall Cognitive Status: No family/caregiver present to determine baseline cognitive functioning Area of Impairment: Memory;Problem solving     Memory: Decreased short-term memory       Problem Solving: Slow processing      General Comments      Exercises        Assessment/Plan     PT Assessment Patient needs continued PT services  PT Diagnosis Difficulty walking;Generalized weakness;Acute pain   PT Problem List Decreased strength;Decreased activity tolerance;Decreased balance;Decreased mobility;Decreased knowledge of use of DME;Obesity;Pain  PT Treatment Interventions DME instruction;Balance training;Gait training;Functional mobility training;Patient/family education;Therapeutic activities;Therapeutic exercise   PT Goals (Current goals can be found in the Care Plan section) Acute Rehab PT Goals Patient Stated Goal: Pt wants to be as independent as possible prior to return home PT Goal Formulation: With patient Time For Goal Achievement: 06/01/14 Potential to Achieve Goals: Good    Frequency Min 3X/week   Barriers to discharge        Co-evaluation               End of Session Equipment Utilized During Treatment: Gait belt;Other (comment) (CAM boot) Activity Tolerance: Patient limited by pain;Patient limited by fatigue Patient left: in bed;with call bell/phone within reach Nurse Communication: Mobility status;Need for lift equipment         Time: 1131-1211 PT Time Calculation (min) (ACUTE ONLY): 40 min   Charges:   PT Evaluation $Initial PT Evaluation Tier I: 1 Procedure PT Treatments $Gait Training: 23-37 mins   PT G Codes:          Jame Seelig 21-May-2014, 1:57 PM  [facility] PT [phone]

## 2014-05-19 ENCOUNTER — Inpatient Hospital Stay (HOSPITAL_COMMUNITY): Payer: Medicare Other

## 2014-05-19 DIAGNOSIS — R7302 Impaired glucose tolerance (oral): Secondary | ICD-10-CM

## 2014-05-19 DIAGNOSIS — I10 Essential (primary) hypertension: Secondary | ICD-10-CM

## 2014-05-19 LAB — CBC
HCT: 42.6 % (ref 36.0–46.0)
HEMOGLOBIN: 13.8 g/dL (ref 12.0–15.0)
MCH: 29.3 pg (ref 26.0–34.0)
MCHC: 32.4 g/dL (ref 30.0–36.0)
MCV: 90.4 fL (ref 78.0–100.0)
PLATELETS: 401 10*3/uL — AB (ref 150–400)
RBC: 4.71 MIL/uL (ref 3.87–5.11)
RDW: 13.9 % (ref 11.5–15.5)
WBC: 14 10*3/uL — ABNORMAL HIGH (ref 4.0–10.5)

## 2014-05-19 MED ORDER — PANTOPRAZOLE SODIUM 40 MG IV SOLR
40.0000 mg | INTRAVENOUS | Status: DC
  Administered 2014-05-19 – 2014-05-20 (×2): 40 mg via INTRAVENOUS
  Filled 2014-05-19 (×2): qty 40

## 2014-05-19 MED ORDER — ONDANSETRON HCL 4 MG/2ML IJ SOLN: 4.0000 mg | INTRAMUSCULAR | Status: DC | PRN

## 2014-05-19 MED ORDER — ONDANSETRON HCL 4 MG PO TABS
4.0000 mg | ORAL_TABLET | ORAL | Status: DC | PRN
  Administered 2014-05-21: 4 mg via ORAL
  Filled 2014-05-19: qty 1

## 2014-05-19 MED ORDER — PROMETHAZINE HCL 25 MG/ML IJ SOLN
12.5000 mg | Freq: Once | INTRAMUSCULAR | Status: AC
  Administered 2014-05-19: 12.5 mg via INTRAVENOUS
  Filled 2014-05-19: qty 1

## 2014-05-19 MED ORDER — LOSARTAN POTASSIUM 50 MG PO TABS
50.0000 mg | ORAL_TABLET | Freq: Every day | ORAL | Status: DC
  Administered 2014-05-19 – 2014-05-21 (×3): 50 mg via ORAL
  Filled 2014-05-19 (×3): qty 1

## 2014-05-19 NOTE — Progress Notes (Signed)
Patient's BP 162/103,text paged K. Kirby,NP. Awaiting call back. Joshaua Epple, Drinda Butts, Charity fundraiser

## 2014-05-19 NOTE — Progress Notes (Signed)
PROGRESS NOTE  [name] [name] WUJ:[id number] DOB: 1941-04-20 DOA: 05/17/2014 PCP: Ezequiel Kayser, MD  Assessment/Plan: Encephalopathy acute -Due to polypharmacy, UTI, ?CO2 narcosis from OSA/OHS - now off narcan gtt - -holding zanaflex/klonopin - attempt to resume low dose pain med for legitimate pain -  -Head Ct unremarkable and exam non focal - resume very low dose benzo to prevent withdrawal  -05/19/2014--mental status back at baseline UTI Empiric Levaquin -urine culture neg, but finish empiric course of levofloxacin given pyuria and clinical presentation  Nausea and vomiting -2 view abdomen -start PPI -pt is s/p cholecystectomy -05/18/14 LFTs WNL -increase IVF  Obstructive sleep apnea unclear if she complies w/ CPAP chronically - follow sats - attempt to resume home CPAP   S/P left TKA  PT/OT - followed by Dr. Charlann Boxer ([facility])--recommends skilled nursing facility -Patient previously at [facility]   Depression Resume wellbutrin  Chronic back pain Resume low dose narcotic  Recent Nondisplaced oblique distal right fibular metaphyseal fracture following recent fall - in brace - FU with Orthopedics -Patient presently in a cam boot  Hyperglycemia  -Hemoglobin A1c 6.4 -Will allow patient to discuss with primary care provider to determine need to start oral agent  HTN -Restart losartan  HLD Resume usual tx   Hypothyroidism Check TSH in setting of altered mental status - cont home synthroid dose for now   Obesity - Body mass index is 36.17 kg/(m^2).   Family Communication:   Pt at beside Disposition Plan:   Home when medically stable       Procedures/Studies: Dg Tibia/fibula Right  05/14/2014   CLINICAL DATA:  Ankle injury.  Fall.  EXAM: RIGHT TIBIA AND FIBULA - 2 VIEW  COMPARISON:  Ankle films 10/23/2011.  FINDINGS: There is no fracture dislocation tibia-fibula. There is a remote fracture of the medial malleolus.  IMPRESSION: No acute  fracture.   Electronically Signed   By: Genevive Bi M.D.   On: 05/14/2014 18:41   Dg Ankle Complete Right  05/14/2014   CLINICAL DATA:  Fall today and restaurant.  Right ankle pain.  EXAM: RIGHT ANKLE - COMPLETE 3+ VIEW  COMPARISON:  None.  FINDINGS: There is an oblique fracture through the distal right fibular metaphysis, nondisplaced. Well corticated bone fragments off the tip of the medial malleolus likely reflect old injury. No acute tibial abnormality seen.  IMPRESSION: Nondisplaced oblique distal right fibular metaphyseal fracture.   Electronically Signed   By: Charlett Nose M.D.   On: 05/14/2014 18:40   Ct Head Wo Contrast  05/17/2014   CLINICAL DATA:  Patient found unresponsive on the ground earlier today by her daughter. Patient bradycardic with pinpoint pupils upon EMS arrival. Patient required mask and bag ventilation assistance.  EXAM: CT HEAD WITHOUT CONTRAST  TECHNIQUE: Contiguous axial images were obtained from the base of the skull through the vertex without intravenous contrast.  COMPARISON:  MRI brain 09/18/2010.  CT head 12/14/2007.  FINDINGS: Slight head tilt in the gantry accounts for apparent asymmetry in the cerebral hemispheres. Ventricular system normal in size and appearance for age. Severe changes of small vessel disease of the white matter diffusely, progressive since 2009. No evidence of significant atrophy. No mass lesion. No midline shift. No acute hemorrhage or hematoma. No extra-axial fluid collections. No evidence of acute infarction.  No skull fracture or other focal osseous abnormality involving the skull. Minimal mucosal thickening involving the left maxillary sinus and scattered bilateral ethmoid air cells.  Remaining visualized paranasal sinuses, bilateral mastoid air cells and bilateral middle ear cavities well-aerated. Partial empty sella. Moderate to severe bilateral carotid siphon and vertebral artery atherosclerosis.  IMPRESSION: 1. No acute intracranial  abnormality. 2. Severe chronic microvascular ischemic changes of the white matter diffusely, progressive since 2009.   Electronically Signed   By: Hulan Saashomas  Lawrence M.D.   On: 05/17/2014 13:07   Dg Chest Portable 1 View  05/17/2014   CLINICAL DATA:  Bradycardia and altered mental status  EXAM: PORTABLE CHEST - 1 VIEW  COMPARISON:  April 05, 2014  FINDINGS: There is no appreciable edema or consolidation. Heart is upper normal in size with pulmonary vascularity within normal limits. No adenopathy. No bone lesions.  IMPRESSION: No edema or consolidation.   Electronically Signed   By: Bretta BangWilliam  Woodruff M.D.   On: 05/17/2014 11:36         Subjective: Patient had 2 episodes of nausea and vomiting last night. Denies any fevers, chills, chest pain, shortness breath, coughing. She has some epigastric discomfort. Denies any dysuria, diarrhea, hematochezia.   Objective: Filed Vitals:   05/18/14 1537 05/18/14 2057 05/19/14 0433 05/19/14 0626  BP: 140/81 155/93 161/102 162/103  Pulse: 84 81 85 82  Temp: 97.9 F (36.6 C) 97.8 F (36.6 C) 98.1 F (36.7 C)   TempSrc: Oral     Resp: 18 17 17    Height:      Weight:  102.059 kg (225 lb)    SpO2: 95% 98% 95%     Intake/Output Summary (Last 24 hours) at 05/19/14 0910 Last data filed at 05/19/14 0700  Gross per 24 hour  Intake 1110.25 ml  Output      0 ml  Net 1110.25 ml   Weight change: -2.722 kg (-6 lb) Exam:   General:  Pt is alert, follows commands appropriately, not in acute distress  HEENT: No icterus, No thrush,[place]/AT  Cardiovascular: RRR, S1/S2, no rubs, no gallops  Respiratory: CTA bilaterally, no wheezing, no crackles, no rhonchi  Abdomen: Soft/+BS, non tender, non distended, no guarding  Extremities: trace LE edema, No lymphangitis, No petechiae, No rashes, no synovitis  Data Reviewed: Basic Metabolic Panel:  Recent Labs Lab 05/17/14 1102 05/18/14 0401  NA 136* 138  K 4.8 4.7  CL 98 101  CO2 23 25  GLUCOSE 212*  111*  BUN 18 12  CREATININE 0.78 0.80  CALCIUM 9.1 9.4   Liver Function Tests:  Recent Labs Lab 05/17/14 1102 05/18/14 0401  AST 37 20  ALT 37* 29  ALKPHOS 124* 112  BILITOT 0.4 0.7  PROT 7.1 6.5  ALBUMIN 3.1* 3.0*   No results for input(s): LIPASE, AMYLASE in the last 168 hours. No results for input(s): AMMONIA in the last 168 hours. CBC:  Recent Labs Lab 05/17/14 1102 05/18/14 0401 05/19/14 0447  WBC 12.8* 15.1* 14.0*  NEUTROABS 9.3*  --   --   HGB 13.6 12.7 13.8  HCT 41.7 40.3 42.6  MCV 92.5 90.8 90.4  PLT 353 354 401*   Cardiac Enzymes:  Recent Labs Lab 05/17/14 1102  TROPONINI <0.30   BNP: Invalid input(s): POCBNP CBG: No results for input(s): GLUCAP in the last 168 hours.  Recent Results (from the past 240 hour(s))  Culture, Urine     Status: None   Collection Time: 05/17/14  4:40 PM  Result Value Ref Range Status   Specimen Description URINE, CATHETERIZED  Final   Special Requests NONE  Final   Culture  Setup Time  Final    05/17/2014 22:39 Performed at [facility] Count NO GROWTH Performed at [facility]   Final   Culture NO GROWTH Performed at [facility]   Final   Report Status 05/18/2014 FINAL  Final  MRSA PCR Screening     Status: None   Collection Time: 05/17/14  4:53 PM  Result Value Ref Range Status   MRSA by PCR NEGATIVE NEGATIVE Final    Comment:        The GeneXpert MRSA Assay (FDA approved for NASAL specimens only), is one component of a comprehensive MRSA colonization surveillance program. It is not intended to diagnose MRSA infection nor to guide or monitor treatment for MRSA infections.      Scheduled Meds: . atorvastatin  40 mg Oral QHS  . buPROPion  300 mg Oral QHS  . docusate sodium  100 mg Oral BID  . enoxaparin (LOVENOX) injection  50 mg Subcutaneous Q24H  . FLUoxetine  40 mg Oral Daily  . levofloxacin (LEVAQUIN) IV  500 mg Intravenous Q24H  . levothyroxine  200 mcg  Oral QAC breakfast  . pantoprazole (PROTONIX) IV  40 mg Intravenous Q24H  . polyethylene glycol  17 g Oral BID   Continuous Infusions: . sodium chloride 30 mL/hr at 05/18/14 1127     Rajveer Handler, DO  [facility] Pager [phone]  If 7PM-7AM, please contact night-coverage [email or web address] Password [email or web address] 05/19/2014, 9:10 AM   LOS: 2 days

## 2014-05-20 ENCOUNTER — Inpatient Hospital Stay (HOSPITAL_COMMUNITY): Payer: Medicare Other

## 2014-05-20 DIAGNOSIS — N390 Urinary tract infection, site not specified: Principal | ICD-10-CM | POA: Insufficient documentation

## 2014-05-20 DIAGNOSIS — R7401 Elevation of levels of liver transaminase levels: Secondary | ICD-10-CM | POA: Insufficient documentation

## 2014-05-20 DIAGNOSIS — R740 Nonspecific elevation of levels of transaminase and lactic acid dehydrogenase [LDH]: Secondary | ICD-10-CM

## 2014-05-20 LAB — COMPREHENSIVE METABOLIC PANEL
ALBUMIN: 3.1 g/dL — AB (ref 3.5–5.2)
ALK PHOS: 138 U/L — AB (ref 39–117)
ALT: 125 U/L — AB (ref 0–35)
AST: 159 U/L — AB (ref 0–37)
Anion gap: 15 (ref 5–15)
BUN: 13 mg/dL (ref 6–23)
CALCIUM: 8.9 mg/dL (ref 8.4–10.5)
CO2: 24 mEq/L (ref 19–32)
Chloride: 103 mEq/L (ref 96–112)
Creatinine, Ser: 0.8 mg/dL (ref 0.50–1.10)
GFR calc Af Amer: 85 mL/min — ABNORMAL LOW (ref >90)
GFR calc non Af Amer: 73 mL/min — ABNORMAL LOW (ref >90)
GLUCOSE: 109 mg/dL — AB (ref 70–99)
POTASSIUM: 3.4 meq/L — AB (ref 3.7–5.3)
SODIUM: 142 meq/L (ref 137–147)
Total Bilirubin: 1.9 mg/dL — ABNORMAL HIGH (ref 0.3–1.2)
Total Protein: 6.7 g/dL (ref 6.0–8.3)

## 2014-05-20 LAB — CBC
HEMATOCRIT: 42.6 % (ref 36.0–46.0)
HEMOGLOBIN: 14.2 g/dL (ref 12.0–15.0)
MCH: 30.7 pg (ref 26.0–34.0)
MCHC: 33.3 g/dL (ref 30.0–36.0)
MCV: 92 fL (ref 78.0–100.0)
Platelets: 364 10*3/uL (ref 150–400)
RBC: 4.63 MIL/uL (ref 3.87–5.11)
RDW: 14 % (ref 11.5–15.5)
WBC: 13.2 10*3/uL — ABNORMAL HIGH (ref 4.0–10.5)

## 2014-05-20 MED ORDER — POTASSIUM CHLORIDE 10 MEQ/100ML IV SOLN
10.0000 meq | INTRAVENOUS | Status: AC
Start: 2014-05-20 — End: 2014-05-20
  Administered 2014-05-20 (×2): 10 meq via INTRAVENOUS
  Filled 2014-05-20 (×2): qty 100

## 2014-05-20 MED ORDER — PANTOPRAZOLE SODIUM 40 MG PO TBEC
40.0000 mg | DELAYED_RELEASE_TABLET | Freq: Every day | ORAL | Status: DC
  Administered 2014-05-21: 40 mg via ORAL
  Filled 2014-05-20: qty 1

## 2014-05-20 NOTE — Evaluation (Addendum)
Occupational Therapy Evaluation Patient Details Name: [name] [name] MRN: [id number] DOB: [birth year] Today's Date: 05/20/2014    History of Present Illness Pt adm with acute encephalopathy. Pt with lt TKA ~1 month ago and with rt fibular fx and bruising of lt ribs several days ago. PMH -    Clinical Impression   Attempted to use Huntley Dec Plus to transfer to chair but pt not able to tolerate lift moving into standing due to discomfort in L rib area and in UEs with weightbearing on support surface. Assisted with scoot to chair using pad with +2 total assist. Pt will need SNF at d/c. Will follow on acute to progress ADL independence as tolerated.     Follow Up Recommendations  SNF;Supervision/Assistance - 24 hour    Equipment Recommendations   (TBD at next venue)    Recommendations for Other Services       Precautions / Restrictions Precautions Precautions: Fall Required Braces or Orthoses: Other Brace/Splint Other Brace/Splint: Cam boot on rt leg Restrictions Weight Bearing Restrictions: Yes RLE Weight Bearing: Weight bearing as tolerated LLE Weight Bearing: Weight bearing as tolerated Other Position/Activity Restrictions: CAM boot on rt leg      Mobility Bed Mobility Overal bed mobility: +2 for physical assistance Bed Mobility: Supine to Sit     Supine to sit: +2 for physical assistance;Max assist     General bed mobility comments: assist for LEs over to EOB and to bring trunk to upright. Pt limited by L rib area pain.   Transfers Overall transfer level: Needs assistance Equipment used: Rolling walker (2 wheeled) Transfers: Sit to/from [employer] Sit to Stand: +2 physical assistance;Total assist   Squat pivot transfers: +2 physical assistance;Total assist     General transfer comment: Used Huntley Dec Plus for sit to stand but pt not able to tolerate standing long due to discomfort in L rib area and in UEs with weightbearing on armrests.  Used bed pad for  scooting over to droparm recliner with +2 total assist.     Balance Overall balance assessment: Needs assistance Sitting-balance support: Feet supported;Bilateral upper extremity supported Sitting balance-Leahy Scale: Poor     Standing balance support: Bilateral upper extremity supported;During functional activity Standing balance-Leahy Scale: Zero                              ADL Overall ADL's : Needs assistance/impaired Eating/Feeding: Independent;Bed level   Grooming: Wash/dry face;Bed level;Set up   Upper Body Bathing: Minimal assitance;Sitting   Lower Body Bathing: Total assistance;+2 for physical assistance;+2 for safety/equipment Lower Body Bathing Details (indicate cue type and reason): with use of Huntley Dec to stand Upper Body Dressing : Minimal assistance;Sitting   Lower Body Dressing: Total assistance Lower Body Dressing Details (indicate cue type and reason): with socks Toilet Transfer: +2 for physical assistance;+2 for safety/equipment;Total assistance Toilet Transfer Details (indicate cue type and reason): scoot to the chair with use of pad.           General ADL Comments: Attempted to use Huntley Dec Plus for transfer to chair but pt unable tolerate UE positioning on arm rests and elbows kept sliding off as she came up to standing. Pt also very uncomfortable in R rib area with movement of Sara into standing. Attempted X2 but unable to use Huntley Dec for transfer to chair.  Brought chair close to bed and dropped the armrest and did scoot pivot with pad over to the chair with +2  total assist. Pt limited by L rib area pain and overall deconditioning.      Vision                     Perception     Praxis      Pertinent Vitals/Pain Pain Assessment: 0-10 Pain Score: 4  Pain Location: L side ribs Pain Descriptors / Indicators: Grimacing;Guarding Pain Intervention(s): Limited activity within patient's tolerance;Monitored during session;Repositioned      Hand Dominance     Extremity/Trunk Assessment Upper Extremity Assessment Upper Extremity Assessment: Generalized weakness           Communication Communication Communication: No difficulties   Cognition Arousal/Alertness: Awake/alert Behavior During Therapy: Flat affect Overall Cognitive Status: No family/caregiver present to determine baseline cognitive functioning                     General Comments       Exercises       Shoulder Instructions      Home Living Family/patient expects to be discharged to:: Skilled nursing facility Living Arrangements: Children                                      Prior Functioning/Environment Level of Independence: Needs assistance    ADL's / Homemaking Assistance Needed: pt states her daughter assisted some PRN with ADL        OT Diagnosis: Generalized weakness;Acute pain   OT Problem List: Decreased strength;Decreased knowledge of use of DME or AE;Pain;Decreased activity tolerance   OT Treatment/Interventions: Self-care/ADL training;Patient/family education;Therapeutic activities;DME and/or AE instruction    OT Goals(Current goals can be found in the care plan section) Acute Rehab OT Goals Patient Stated Goal: agreeable to get up with therapy\ OT Goal Formulation: With patient Time For Goal Achievement: 06/03/14 Potential to Achieve Goals: Good  OT Frequency: Min 2X/week   Barriers to D/C:            Co-evaluation              End of Session Equipment Utilized During Treatment: Gait belt Nurse Communication: Mobility status  Activity Tolerance: Patient limited by pain Patient left: in chair;with call bell/phone within reach   Time: 1120-1201 OT Time Calculation (min): 41 min Charges:  OT General Charges $OT Visit: 1 Procedure OT Evaluation $Initial OT Evaluation Tier I: 1 Procedure OT Treatments $Therapeutic Activity: 8-22 mins G-Codes:    Lennox LaityStone, Sayvon Arterberry Stafford   [phone] 05/20/2014, 12:35 PM

## 2014-05-20 NOTE — Plan of Care (Signed)
Problem: Phase II Progression Outcomes Goal: Vital signs remain stable Outcome: Adequate for Discharge     

## 2014-05-20 NOTE — Progress Notes (Signed)
PROGRESS NOTE  [name] [name] ZOX:[id number] DOB: August 20, 1940 DOA: 05/17/2014 PCP: Ezequiel Kayser, MD  Assessment/Plan: Encephalopathy acute -Due to polypharmacy, UTI, ?CO2 narcosis from OSA/OHS - now off narcan gtt - -holding zanaflex/klonopin - attempt to resume low dose pain med for legitimate pain -  -Head Ct unremarkable and exam non focal - resume very low dose benzo to prevent withdrawal  -05/19/2014--mental status back at baseline -Pain appears controlled with oxycodone 10 mg every 6 hours -I have told the patient that it is not reasonable to take 12 mg of Zanaflex at bedtime given her recent presentation UTI Empiric Levaquin-plan 7 days -urine culture neg, but finish empiric course of levofloxacin given pyuria and clinical presentation  Nausea and vomiting -2 view abdomen--negative for obstruction -05/20/2014--increased AST, ALT, total bilirubin--> abdominal ultrasound -No further vomiting--tolerating cardiac diet -start PPI -pt is s/p cholecystectomy  Obstructive sleep apnea unclear if she complies w/ CPAP chronically - follow sats - attempt to resume home CPAP   S/P left TKA  PT/OT - followed by Dr. Charlann Boxer ([facility])--recommends skilled nursing facility -Patient previously at [facility]   Depression Resume wellbutrin  Chronic back pain Resume low dose narcotic  Recent Nondisplaced oblique distal right fibular metaphyseal fracture following recent fall - in brace - FU with Orthopedics -Patient presently in a cam boot  Hyperglycemia  -Hemoglobin A1c 6.4 -Will allow patient to discuss with primary care provider to determine need to start oral agent  HTN -Restart losartan--> blood pressure improved  HLD Resume usual tx  -Discontinue statin as the patient has had elevation in her LFTs   Hypothyroidism Check TSH in setting of altered mental status - cont home synthroid dose for now  --TSH 0.932  Obesity - Body mass index is 36.17  kg/(m^2).   Family Communication:   Pt at beside Disposition Plan:   Skilled nursing facility        Procedures/Studies: Dg Tibia/fibula Right  05/14/2014   CLINICAL DATA:  Ankle injury.  Fall.  EXAM: RIGHT TIBIA AND FIBULA - 2 VIEW  COMPARISON:  Ankle films 10/23/2011.  FINDINGS: There is no fracture dislocation tibia-fibula. There is a remote fracture of the medial malleolus.  IMPRESSION: No acute fracture.   Electronically Signed   By: Genevive Bi M.D.   On: 05/14/2014 18:41   Dg Ankle Complete Right  05/14/2014   CLINICAL DATA:  Fall today and restaurant.  Right ankle pain.  EXAM: RIGHT ANKLE - COMPLETE 3+ VIEW  COMPARISON:  None.  FINDINGS: There is an oblique fracture through the distal right fibular metaphysis, nondisplaced. Well corticated bone fragments off the tip of the medial malleolus likely reflect old injury. No acute tibial abnormality seen.  IMPRESSION: Nondisplaced oblique distal right fibular metaphyseal fracture.   Electronically Signed   By: Charlett Nose M.D.   On: 05/14/2014 18:40   Ct Head Wo Contrast  05/17/2014   CLINICAL DATA:  Patient found unresponsive on the ground earlier today by her daughter. Patient bradycardic with pinpoint pupils upon EMS arrival. Patient required mask and bag ventilation assistance.  EXAM: CT HEAD WITHOUT CONTRAST  TECHNIQUE: Contiguous axial images were obtained from the base of the skull through the vertex without intravenous contrast.  COMPARISON:  MRI brain 09/18/2010.  CT head 12/14/2007.  FINDINGS: Slight head tilt in the gantry accounts for apparent asymmetry in the cerebral hemispheres. Ventricular system normal in size and appearance for age. Severe changes of small vessel  disease of the white matter diffusely, progressive since 2009. No evidence of significant atrophy. No mass lesion. No midline shift. No acute hemorrhage or hematoma. No extra-axial fluid collections. No evidence of acute infarction.  No skull fracture or other  focal osseous abnormality involving the skull. Minimal mucosal thickening involving the left maxillary sinus and scattered bilateral ethmoid air cells. Remaining visualized paranasal sinuses, bilateral mastoid air cells and bilateral middle ear cavities well-aerated. Partial empty sella. Moderate to severe bilateral carotid siphon and vertebral artery atherosclerosis.  IMPRESSION: 1. No acute intracranial abnormality. 2. Severe chronic microvascular ischemic changes of the white matter diffusely, progressive since 2009.   Electronically Signed   By: Hulan Saashomas  Lawrence M.D.   On: 05/17/2014 13:07   Dg Chest Portable 1 View  05/17/2014   CLINICAL DATA:  Bradycardia and altered mental status  EXAM: PORTABLE CHEST - 1 VIEW  COMPARISON:  April 05, 2014  FINDINGS: There is no appreciable edema or consolidation. Heart is upper normal in size with pulmonary vascularity within normal limits. No adenopathy. No bone lesions.  IMPRESSION: No edema or consolidation.   Electronically Signed   By: Bretta BangWilliam  Woodruff M.D.   On: 05/17/2014 11:36   Dg Abd 2 Views  05/19/2014   CLINICAL DATA:  Abdominal pain.  EXAM: ABDOMEN - 2 VIEW  COMPARISON:  Single view of the abdomen 10/05/2009. CT abdomen and pelvis 06/12/2011.  FINDINGS: No free intraperitoneal air is identified. The bowel gas pattern is unremarkable. No abnormal abdominal calcification is seen. Fusion hardware lower lumbar spine is noted.  IMPRESSION: No acute finding.   Electronically Signed   By: Drusilla Kannerhomas  Dalessio M.D.   On: 05/19/2014 10:57         Subjective:  patient states that her nausea and vomiting have improved. She ate her dinner last time without difficulty. Denies any fevers, chills, chest pain, shortness breath, nausea, vomiting, diarrhea. Had a bowel movement 2 days ago. Denies any abdominal pain   Objective: Filed Vitals:   05/19/14 0626 05/19/14 1905 05/19/14 2233 05/20/14 0431  BP: 162/103 123/67 144/92 128/79  Pulse: 82 102 90 85   Temp:   98.2 F (36.8 C) 98 F (36.7 C)  TempSrc:   Oral Oral  Resp:  18 17 16   Height:      Weight:      SpO2:  95% 96% 93%    Intake/Output Summary (Last 24 hours) at 05/20/14 0805 Last data filed at 05/20/14 0641  Gross per 24 hour  Intake  962.5 ml  Output      0 ml  Net  962.5 ml   Weight change:  Exam:   General:  Pt is alert, follows commands appropriately, not in acute distress  HEENT: No icterus, No thrush,  [place]/AT  Cardiovascular: RRR, S1/S2, no rubs, no gallops  Respiratory: CTA bilaterally, no wheezing, no crackles, no rhonchi  Abdomen: Soft/+BS, non tender, non distended, no guarding  Extremities: trace edema, No lymphangitis, No petechiae, No rashes, no synovitis  Data Reviewed: Basic Metabolic Panel:  Recent Labs Lab 05/17/14 1102 05/18/14 0401 05/20/14 0437  NA 136* 138 142  K 4.8 4.7 3.4*  CL 98 101 103  CO2 23 25 24   GLUCOSE 212* 111* 109*  BUN 18 12 13   CREATININE 0.78 0.80 0.80  CALCIUM 9.1 9.4 8.9   Liver Function Tests:  Recent Labs Lab 05/17/14 1102 05/18/14 0401 05/20/14 0437  AST 37 20 159*  ALT 37* 29 125*  ALKPHOS 124* 112  138*  BILITOT 0.4 0.7 1.9*  PROT 7.1 6.5 6.7  ALBUMIN 3.1* 3.0* 3.1*   No results for input(s): LIPASE, AMYLASE in the last 168 hours. No results for input(s): AMMONIA in the last 168 hours. CBC:  Recent Labs Lab 05/17/14 1102 05/18/14 0401 05/19/14 0447 05/20/14 0437  WBC 12.8* 15.1* 14.0* 13.2*  NEUTROABS 9.3*  --   --   --   HGB 13.6 12.7 13.8 14.2  HCT 41.7 40.3 42.6 42.6  MCV 92.5 90.8 90.4 92.0  PLT 353 354 401* 364   Cardiac Enzymes:  Recent Labs Lab 05/17/14 1102  TROPONINI <0.30   BNP: Invalid input(s): POCBNP CBG: No results for input(s): GLUCAP in the last 168 hours.  Recent Results (from the past 240 hour(s))  Culture, Urine     Status: None   Collection Time: 05/17/14  4:40 PM  Result Value Ref Range Status   Specimen Description URINE, CATHETERIZED  Final    Special Requests NONE  Final   Culture  Setup Time   Final    05/17/2014 22:39 Performed at [facility]    Colony Count NO GROWTH Performed at [facility]   Final   Culture NO GROWTH Performed at [facility]   Final   Report Status 05/18/2014 FINAL  Final  MRSA PCR Screening     Status: None   Collection Time: 05/17/14  4:53 PM  Result Value Ref Range Status   MRSA by PCR NEGATIVE NEGATIVE Final    Comment:        The GeneXpert MRSA Assay (FDA approved for NASAL specimens only), is one component of a comprehensive MRSA colonization surveillance program. It is not intended to diagnose MRSA infection nor to guide or monitor treatment for MRSA infections.      Scheduled Meds: . buPROPion  300 mg Oral QHS  . docusate sodium  100 mg Oral BID  . enoxaparin (LOVENOX) injection  50 mg Subcutaneous Q24H  . FLUoxetine  40 mg Oral Daily  . levofloxacin (LEVAQUIN) IV  500 mg Intravenous Q24H  . levothyroxine  200 mcg Oral QAC breakfast  . losartan  50 mg Oral Daily  . pantoprazole (PROTONIX) IV  40 mg Intravenous Q24H  . polyethylene glycol  17 g Oral BID   Continuous Infusions: . sodium chloride 75 mL/hr at 05/19/14 2239     Ricard Faulkner, DO  [facility] Pager [phone]  If 7PM-7AM, please contact night-coverage [email or web address] Password [email or web address] 05/20/2014, 8:05 AM   LOS: 3 days

## 2014-05-20 NOTE — Care Management Note (Signed)
CARE MANAGEMENT NOTE 05/20/2014  Patient:  [name] [name],[name] [name]   Account Number:  [id number]  Date Initiated:  05/20/2014  Documentation initiated by:  Trinka Keshishyan  Subjective/Objective Assessment:   CM following for progession and d/c planning.     Action/Plan:   Met with pt who plans to d/c to SNF for ongoing short term rehab.   Anticipated DC Date:  05/21/2014   Anticipated DC Plan:  SKILLED NURSING FACILITY         Choice offered to / List presented to:             Status of service:  Completed, signed off Medicare Important Message given?  YES (If response is "NO", the following Medicare IM given date fields will be blank) Date Medicare IM given:  05/20/2014 Medicare IM given by:  Kylea Berrong Date Additional Medicare IM given:   Additional Medicare IM given by:    Discharge Disposition:    Per UR Regulation:    If discussed at Long Length of Stay Meetings, dates discussed:    Comments:

## 2014-05-20 NOTE — Clinical Social Work Psychosocial (Signed)
Clinical Social Work Department BRIEF PSYCHOSOCIAL ASSESSMENT 05/20/2014  Patient:  [name] [name], [name] [name]     Account Number:  [id number]     Admit date:  05/17/2014  Clinical Social Worker:  Delmer Islam  Date/Time:  05/20/2014 01:29 AM  Referred by:  Physician  Date Referred:  05/20/2014 Referred for  SNF Placement   Other Referral:   Interview type:  Patient Other interview type:    PSYCHOSOCIAL DATA Living Status:  WITH ADULT CHILDREN Admitted from facility:   Level of care:   Primary support name:  Hollie Salk Primary support relationship to patient:   Degree of support available:   Patient says her daughter lives with her. Patient has 2 daughters, Hollie Salk and Dorothyann Peng [phone])    CURRENT CONCERNS Current Concerns  Post-Acute Placement   Other Concerns:    SOCIAL WORK ASSESSMENT / PLAN CSW talked with patient about discharge planning and MD's recommendation of ST rehab. Patient agreeable to ST rehab and  added that she does not want to return to [facility] as she did not like the care she rec'd there. Patient stated that she was in [place] in October, stayed there about a week and went home.   Assessment/plan status:  Psychosocial Support/Ongoing Assessment of Needs Other assessment/ plan:   Information/referral to community resources:   Patient provided with a list of skilled facilities in [facility]    PATIENT'S/FAMILY'S RESPONSE TO PLAN OF CARE: Patient was in pain but talked with CSW about going to a skilled facility at discharge.

## 2014-05-20 NOTE — Clinical Social Work Placement (Addendum)
Clinical Social Work Department CLINICAL SOCIAL WORK PLACEMENT NOTE 05/20/2014  Patient:  [name] [name], [name] [name]  Account Number:  [id number] Admit date:  05/17/2014  Clinical Social Worker:  Genelle Bal, LCSW  Date/time:  05/20/2014 01:36 AM  Clinical Social Work is seeking post-discharge placement for this patient at the following level of care:   SKILLED NURSING   (*CSW will update this form in Epic as items are completed)   05/20/2014  Patient/family provided with [facility] Department of Clinical Social Work's list of facilities offering this level of care within the geographic area requested by the patient (or if unable, by the patient's family).  05/20/2014  Patient/family informed of their freedom to choose among providers that offer the needed level of care, that participate in Medicare, Medicaid or managed care program needed by the patient, have an available bed and are willing to accept the patient.    Patient/family informed of [facility]' ownership interest in [facility], as well as of the fact that they are under no obligation to receive care at this facility.  PASARR submitted to [employer] in 2015  PASARR number received in 2015   FL2 transmitted to all facilities in geographic area requested by pt/family on  05/20/2014 FL2 transmitted to all facilities within larger geographic area on   Patient informed that his/her managed care company has contracts with or will negotiate with  certain facilities, including the following:     Patient/family informed of bed offers received: 05/20/14   Patient chooses bed at [facility] Physician recommends and patient chooses bed at    Patient to be transferred to [facility] on 05/21/14      Patient to be transferred to facility by PTAR-Lynnix Schoneman Patrick-Jefferson, LCSWA Patient and family notified of transfer on 05/21/14   Name of family member notified:  Cathie Olden  The following physician request were  entered in Epic:  Additional Comments: 05/20/14: Anticipated d/c date is Saturday, 05/21/14.

## 2014-05-20 NOTE — Progress Notes (Signed)
Physical Therapy Treatment Patient Details Name: [name] CounterLinda Starkman MRN: [id number] DOB: 20-Oct-1940 Today's Date: 05/20/2014    History of Present Illness Pt adm with acute encephalopathy. Pt with lt TKA ~1 month ago and with rt fibular fx and bruising of lt ribs several days ago. PMH -     PT Comments    Pt progressing slowly towards physical therapy goals. Pt unable to tolerate standing with use of Sara Plus due to rib pain and difficulty supporting weight through UE's. Pt was assisted to the drop-arm recliner with +2 total assist, utilizing 3 scoots to get fully into the chair. Will continue to follow.   Follow Up Recommendations  SNF     Equipment Recommendations  None recommended by PT    Recommendations for Other Services       Precautions / Restrictions Precautions Precautions: Fall Required Braces or Orthoses: Other Brace/Splint Other Brace/Splint: Cam boot on rt leg Restrictions Weight Bearing Restrictions: Yes RLE Weight Bearing: Weight bearing as tolerated LLE Weight Bearing: Weight bearing as tolerated Other Position/Activity Restrictions: CAM boot on rt leg    Mobility  Bed Mobility Overal bed mobility: Needs Assistance Bed Mobility: Supine to Sit     Supine to sit: +2 for physical assistance;Max assist     General bed mobility comments: Assist to bring legs off of bed and to bring trunk up into sitting.  Transfers Overall transfer level: Needs assistance Equipment used: Rolling walker (2 wheeled) Transfers: Sit to/from [employer] CorporationStand;Squat Pivot Transfers Sit to Stand: +2 physical assistance;Total assist   Squat pivot transfers: +2 physical assistance;Total assist     General transfer comment: Used Sara Plus for standing x2. Pt was unable to achieve full upright position, and had difficulty bearing weight through LUE due to rib pain. As pt was unable to tolerate, squat pivot transfer utilized from bed to drop arm recliner.   Ambulation/Gait              General Gait Details: Unable   Information systems managertairs            Wheelchair Mobility    Modified Rankin (Stroke Patients Only)       Balance Overall balance assessment: Needs assistance Sitting-balance support: Feet supported;Bilateral upper extremity supported Sitting balance-Leahy Scale: Poor     Standing balance support: Bilateral upper extremity supported;During functional activity Standing balance-Leahy Scale: Zero                      Cognition Arousal/Alertness: Awake/alert Behavior During Therapy: Flat affect Overall Cognitive Status: No family/caregiver present to determine baseline cognitive functioning                      Exercises      General Comments        Pertinent Vitals/Pain Pain Assessment: 0-10 Pain Score: 4  Pain Location: L side ribs Pain Descriptors / Indicators: Grimacing;Guarding Pain Intervention(s): Limited activity within patient's tolerance;Monitored during session;Repositioned    Home Living Family/patient expects to be discharged to:: Skilled nursing facility Living Arrangements: Children                  Prior Function Level of Independence: Needs assistance    ADL's / Homemaking Assistance Needed: pt states her daughter assisted some PRN with ADL     PT Goals (current goals can now be found in the care plan section) Acute Rehab PT Goals Patient Stated Goal: agreeable to get up with therapy\ PT Goal Formulation: With  patient Time For Goal Achievement: 06/01/14 Potential to Achieve Goals: Good Progress towards PT goals: Progressing toward goals    Frequency  Min 3X/week    PT Plan Current plan remains appropriate    Co-evaluation             End of Session Equipment Utilized During Treatment: Gait belt (CAM boot) Activity Tolerance: Patient limited by pain;Patient limited by fatigue Patient left: in chair;with call bell/phone within reach     Time: 1120-1203 PT Time Calculation (min) (ACUTE  ONLY): 43 min  Charges:  $Therapeutic Activity: 23-37 mins                    G Codes:      Conni Slipper May 31, 2014, 12:32 PM   Conni Slipper, PT, DPT Acute Rehabilitation Services Pager: [phone]

## 2014-05-21 LAB — COMPREHENSIVE METABOLIC PANEL
ALBUMIN: 2.9 g/dL — AB (ref 3.5–5.2)
ALK PHOS: 168 U/L — AB (ref 39–117)
ALT: 200 U/L — ABNORMAL HIGH (ref 0–35)
ANION GAP: 12 (ref 5–15)
AST: 115 U/L — ABNORMAL HIGH (ref 0–37)
BUN: 13 mg/dL (ref 6–23)
CO2: 24 mEq/L (ref 19–32)
Calcium: 8.9 mg/dL (ref 8.4–10.5)
Chloride: 103 mEq/L (ref 96–112)
Creatinine, Ser: 0.87 mg/dL (ref 0.50–1.10)
GFR calc Af Amer: 76 mL/min — ABNORMAL LOW (ref >90)
GFR calc non Af Amer: 66 mL/min — ABNORMAL LOW (ref >90)
Glucose, Bld: 94 mg/dL (ref 70–99)
Potassium: 4 mEq/L (ref 3.7–5.3)
SODIUM: 139 meq/L (ref 137–147)
Total Bilirubin: 0.6 mg/dL (ref 0.3–1.2)
Total Protein: 6.3 g/dL (ref 6.0–8.3)

## 2014-05-21 LAB — HEPATITIS C ANTIBODY: HCV Ab: NEGATIVE

## 2014-05-21 LAB — HEPATITIS B SURFACE ANTIGEN: Hepatitis B Surface Ag: NEGATIVE

## 2014-05-21 LAB — MAGNESIUM: Magnesium: 1.7 mg/dL (ref 1.5–2.5)

## 2014-05-21 MED ORDER — OXYCODONE HCL 5 MG PO TABS: 5.0000 mg | ORAL_TABLET | Freq: Four times a day (QID) | ORAL | Status: DC | PRN

## 2014-05-21 MED ORDER — TIOTROPIUM BROMIDE MONOHYDRATE 18 MCG IN CAPS: 18.0000 ug | ORAL_CAPSULE | Freq: Every day | RESPIRATORY_TRACT | Status: DC | PRN

## 2014-05-21 MED ORDER — LEVOFLOXACIN 500 MG PO TABS: 500.0000 mg | ORAL_TABLET | Freq: Every day | ORAL | Status: DC

## 2014-05-21 MED ORDER — CLONAZEPAM 0.5 MG PO TABS: 0.2500 mg | ORAL_TABLET | Freq: Three times a day (TID) | ORAL | Status: DC | PRN

## 2014-05-21 MED ORDER — OXYCODONE-ACETAMINOPHEN 5-325 MG PO TABS: 1.0000 | ORAL_TABLET | Freq: Four times a day (QID) | ORAL | Status: DC | PRN

## 2014-05-21 MED ORDER — LEVOFLOXACIN 500 MG PO TABS
500.0000 mg | ORAL_TABLET | Freq: Every day | ORAL | Status: DC
  Administered 2014-05-21: 500 mg via ORAL
  Filled 2014-05-21: qty 1

## 2014-05-21 NOTE — Clinical Social Work Note (Signed)
CSW made aware by RN Synetta Fail) patient ready for d/c to [facility]. CSW spoke with Wille Celeste at facility who confirmed bed availability. CSW contacted patient's daughter Hollie Salk) who is agreeable to d/c plan. CSW prepared d/c packet and faxed d/c summary to facility. CSW placed d/c packet in patient's shadow chart and provided RN with number for report. CSW to arrange transportation via [place]. No further needs. CSW signing off.   Rikia Sukhu Patrick-Jefferson, LCSWA Weekend Clinical Social Worker [phone]

## 2014-05-21 NOTE — Clinical Social Work Note (Signed)
CSW continues to follow for d/c planning needs. CSW spoke with Wille Celeste of [facility] regarding d/c. Per Wille Celeste, she will be at facility until 1pm and requests patient to be transported to facility after lunch. CSW contacted RN regarding d/c. Per RN, she will follow up with CSW regarding d/c disposition. CSW to prepare d/c packet and contact patient's daughter Melina Schools, once disposition is confirmed.   [name] [name], LCSWA Weekend Clinical Social Worker [phone]

## 2014-05-21 NOTE — Progress Notes (Signed)
Report given to "CJ" @ [facility]. No questions @ present. Daughter notified of transport to facility per Crystal, SW. Will d/c IV and pack belongings in preparation for transport back to facility.

## 2014-05-21 NOTE — Discharge Summary (Signed)
Physician Discharge Summary  [name] [name] ZOX:[id number] DOB: [birth year] DOA: 05/17/2014  PCP: Ezequiel Kayser, MD  Admit date: 05/17/2014 Discharge date: 05/21/2014  Recommendations for Outpatient Follow-up:  1. Pt will need to follow up with PCP in 2 weeks post discharge 2. Please check LFTs on 05/23/2014 and fax results to the patient's primary care provider, Dr. Rodrigo Ran 3. F/u Dr. Charlann Boxer in 1-2 weeks (ortho)     Discharge Diagnoses:  Encephalopathy acute -Due to polypharmacy, UTI, ?CO2 narcosis from OSA/OHS -  -Patient was initially given Narcan and placed on Narcan drip with clinical improvement -now off narcan gtt - -holding zanaflex/klonopin - resumed low dose pain med for legitimate pain -  -Head Ct unremarkable and exam non focal - resume very low dose benzo to prevent withdrawal  -05/19/2014--mental status back at baseline -Pain appears controlled with oxycodone 10 mg every 6 hours -I have told the patient that it is not reasonable to take 12 mg of Zanaflex at bedtime given her recent presentation  -the patient's Zanaflex was discontinued altogether, and she did not have any further complications or uncontrolled pain -TSH is 0.932 UTI Empiric Levaquin-plan 7 days -urine culture neg, but finish empiric course of levofloxacin given pyuria and clinical presentation -The patient received 3 days during the hospitalization. She'll be discharged with 4 additional days to complete 7 days of therapy  Nausea and vomiting/Transaminasemia -2 view abdomen--negative for obstruction -05/20/2014--increased AST, ALT, total bilirubin--> abdominal ultrasound -Abdominal ultrasound was negative for any acute hepatobiliary pathology, but did reveal fatty liver--her, bile duct was chronically dilated at 10 mm without changes -Elevated LFTs was thought to be due to her acute medical illness as well as partially contributed by her statin. Repeat LFTs remained fairly stable/improved  -I  requested for repeat LFTs was placed for 05/23/2014  -No further vomiting--tolerating cardiac diet -started PPI-->nausea and vomiting improved  -pt is s/p cholecystectomy -Her Lipitor was discontinued due to her elevated LFTs--the patient will need to follow up with her primary care provider prior to restarting Obstructive sleep apnea unclear if she complies w/ CPAP chronically - follow sats - attempt to resume home CPAP   S/P left TKA  PT/OT - followed by Dr. Charlann Boxer ([facility])--recommends skilled nursing facility -Patient previously at [facility]   Depression Resume wellbutrin  Chronic back pain Resume low dose narcotic -The patient's pain was controlled for several days prior to discharge   Recent Nondisplaced oblique distal right fibular metaphyseal fracture following recent fall - in brace - FU with Orthopedics -Patient presently in a cam boot -Weightbearing as tolerated -Patient must wear a cam boot at all times when she is out of bed -Follow up with orthopedics in 1-2 weeks  Hyperglycemia  -Hemoglobin A1c 6.4 -Will allow patient to discuss with primary care provider to determine need to start oral agent  HTN -Restart losartan--> blood pressure improved -Initially, losartan was started at 50 mg daily. She continued to have intermittent systolic blood pressure in the 140s.  -Patient will go home with her previous dose of losartan 100 mg daily   HLD Resume usual tx  -Discontinue statin as the patient has had elevation in her LFTs   Hypothyroidism Check TSH in setting of altered mental status - cont home synthroid dose for now  --TSH 0.932  Obesity - Body mass index is 36.17 kg/(m^2).  Discharge Condition: Stable   Disposition:  Follow-up Information    Follow up with Shelda Pal, MD In 1 week.   Specialty:  Orthopedic Surgery   Contact information:   [address] [place] [address] [phone]       Follow up with Ezequiel Kayser, MD In 2 weeks.   Specialty:  Internal Medicine   Contact information:   [address] [place] [address] [phone]     SNF  Diet:cardiac Wt Readings from Last 3 Encounters:  05/20/14 100.789 kg (222 lb 3.2 oz)  04/21/14 108.41 kg (239 lb)  04/11/14 107.502 kg (237 lb)    History of present illness:  73 yo female with hx of Anxiety, Depression, Tobacco use, OSA, and chronic back pain who was brought to the ER by EMS with decreased responsiveness. She had a L TKA April 11, 2014, went to rehab for 2 weeks, and then went home. She was seen in there ER 3 days prior to this admit following a fall leading to a nondisplaced R fib Fx for which she was advised to FU with Orthopedics. The morning of her admit she was found unresponsive by her daughter and brought to the ER. The previous day her daughter noticed that she had a period of lethargy and decreased responsiveness during the daytime which improved. After admission, the patient was placed on a Narcan drip. She had a good clinical response. The drip was ultimately discontinued, and the patient's mentation remained stable. Her benzodiazepines and opioids were restarted at lower doses. Her pain remained controlled. The patient was found to have a urinary tract infection. She was started on levofloxacin. She will finish 4 additional days which will complete 7 days of therapy.  I spoke with orthopedics, Dr. Charlann Boxer regarding the patient's nondisplaced fibular fracture. The patient was placed in a cam boot. She is to be weightbearing as tolerated. The patient was started to have elevated liver enzymes. Abdominal ultrasound showed fatty liver without any acute hepatobiliary pathology. The patient was status post cholecystectomy. This was set to be due to the patient's acute illness. The patient had no abdominal pain. Her diet was advanced which she tolerated. Her liver enzymes remained stable. Orders were placed to repeat LFTs on 05/23/2014.  If they continued to worsen, she will need to be reevaluated.   Discharge Exam: Filed Vitals:   05/21/14 0902  BP: 127/69  Pulse: 95  Temp: 98.5 F (36.9 C)  Resp: 19   Filed Vitals:   05/20/14 1812 05/20/14 2153 05/21/14 0600 05/21/14 0902  BP: 125/70 142/88  127/69  Pulse: 99 82 80 95  Temp: 98.6 F (37 C) 98.4 F (36.9 C) 97.9 F (36.6 C) 98.5 F (36.9 C)  TempSrc: Oral Oral Oral Oral  Resp: 18 16 18 19   Height:      Weight:  100.789 kg (222 lb 3.2 oz)    SpO2: 99% 96% 94% 95%   General: A&O x 3, NAD, pleasant, cooperative Cardiovascular: RRR, no rub, no gallop, no S3 Respiratory: CTAB, no wheeze, no rhonchi Abdomen:soft, nontender, nondistended, positive bowel sounds Extremities: No edema, No lymphangitis, no petechiae  Discharge Instructions      Discharge Instructions    Diet - low sodium heart healthy    Complete by:  As directed      Increase activity slowly    Complete by:  As directed             Medication List    STOP taking these medications        atorvastatin 40 MG tablet  Commonly known as:  LIPITOR  DSS 100 MG Caps     HYDROcodone-acetaminophen 7.5-325 MG per tablet  Commonly known as:  NORCO     oxyCODONE-acetaminophen 5-325 MG per tablet  Commonly known as:  PERCOCET/ROXICET     tiZANidine 4 MG tablet  Commonly known as:  ZANAFLEX      TAKE these medications        buPROPion 300 MG 24 hr tablet  Commonly known as:  WELLBUTRIN XL  Take 1 tablet by mouth at bedtime.     clonazePAM 0.5 MG tablet  Commonly known as:  KLONOPIN  Take 0.5 tablets (0.25 mg total) by mouth 3 (three) times daily as needed (anxiety).     FIBER PO  Take 1 tablet by mouth every morning.     FLUoxetine 40 MG capsule  Commonly known as:  PROZAC  Take 40 mg by mouth every morning.     gabapentin 100 MG capsule  Commonly known as:  NEURONTIN  Take 100-200 mg by mouth every 8 (eight) hours.     levofloxacin 500 MG tablet  Commonly known as:   LEVAQUIN  Take 1 tablet (500 mg total) by mouth daily.     levothyroxine 200 MCG tablet  Commonly known as:  SYNTHROID, LEVOTHROID  Take 200 mcg by mouth daily before breakfast.     LOMOTIL 2.5-0.025 MG per tablet  Generic drug:  diphenoxylate-atropine  Take 3 tablets by mouth 2 (two) times daily as needed for diarrhea or loose stools.     losartan 100 MG tablet  Commonly known as:  COZAAR  Take 1 tablet by mouth every morning.     omeprazole 20 MG capsule  Commonly known as:  PRILOSEC  Take 20 mg by mouth daily.     oxyCODONE 5 MG immediate release tablet  Commonly known as:  Oxy IR/ROXICODONE  Take 1-2 tablets (5-10 mg total) by mouth every 6 (six) hours as needed for severe pain.     polyethylene glycol packet  Commonly known as:  MIRALAX / GLYCOLAX  Take 17 g by mouth 2 (two) times daily.     tiotropium 18 MCG inhalation capsule  Commonly known as:  SPIRIVA  Place 1 capsule (18 mcg total) into inhaler and inhale daily as needed (shortness of breath.).     VITAMIN B 12 PO  Take 20,000-25,000 mcg by mouth every morning.     Vitamin D 2000 UNITS tablet  Take 4,000 Units by mouth 2 (two) times daily.         The results of significant diagnostics from this hospitalization (including imaging, microbiology, ancillary and laboratory) are listed below for reference.    Significant Diagnostic Studies: Dg Tibia/fibula Right  05/14/2014   CLINICAL DATA:  Ankle injury.  Fall.  EXAM: RIGHT TIBIA AND FIBULA - 2 VIEW  COMPARISON:  Ankle films 10/23/2011.  FINDINGS: There is no fracture dislocation tibia-fibula. There is a remote fracture of the medial malleolus.  IMPRESSION: No acute fracture.   Electronically Signed   By: Genevive Bi M.D.   On: 05/14/2014 18:41   Dg Ankle Complete Right  05/14/2014   CLINICAL DATA:  Fall today and restaurant.  Right ankle pain.  EXAM: RIGHT ANKLE - COMPLETE 3+ VIEW  COMPARISON:  None.  FINDINGS: There is an oblique fracture through the  distal right fibular metaphysis, nondisplaced. Well corticated bone fragments off the tip of the medial malleolus likely reflect old injury. No acute tibial abnormality seen.  IMPRESSION: Nondisplaced oblique distal right fibular metaphyseal  fracture.   Electronically Signed   By: Charlett NoseKevin  Dover M.D.   On: 05/14/2014 18:40   Ct Head Wo Contrast  05/17/2014   CLINICAL DATA:  Patient found unresponsive on the ground earlier today by her daughter. Patient bradycardic with pinpoint pupils upon EMS arrival. Patient required mask and bag ventilation assistance.  EXAM: CT HEAD WITHOUT CONTRAST  TECHNIQUE: Contiguous axial images were obtained from the base of the skull through the vertex without intravenous contrast.  COMPARISON:  MRI brain 09/18/2010.  CT head 12/14/2007.  FINDINGS: Slight head tilt in the gantry accounts for apparent asymmetry in the cerebral hemispheres. Ventricular system normal in size and appearance for age. Severe changes of small vessel disease of the white matter diffusely, progressive since 2009. No evidence of significant atrophy. No mass lesion. No midline shift. No acute hemorrhage or hematoma. No extra-axial fluid collections. No evidence of acute infarction.  No skull fracture or other focal osseous abnormality involving the skull. Minimal mucosal thickening involving the left maxillary sinus and scattered bilateral ethmoid air cells. Remaining visualized paranasal sinuses, bilateral mastoid air cells and bilateral middle ear cavities well-aerated. Partial empty sella. Moderate to severe bilateral carotid siphon and vertebral artery atherosclerosis.  IMPRESSION: 1. No acute intracranial abnormality. 2. Severe chronic microvascular ischemic changes of the white matter diffusely, progressive since 2009.   Electronically Signed   By: Hulan Saashomas  Lawrence M.D.   On: 05/17/2014 13:07   [place] Abdomen Complete  05/20/2014   CLINICAL DATA:  Elevated LFTs with elevated bilirubin and nausea and  vomiting. Previous cholecystectomy.  EXAM: ULTRASOUND ABDOMEN COMPLETE  COMPARISON:  CT 05/1911 and ultrasound 03/11/2011  FINDINGS: Gallbladder: Previous cholecystectomy.  Common bile duct: Diameter: 10 mm unchanged.  Liver: Mild diffuse hepatic steatosis without focal mass.  IVC: No abnormality visualized.  Pancreas: Not well visualized, however the visualized segments are unremarkable.  Spleen: Not visualized.  Right Kidney: Length: 11.3 cm. Echogenicity within normal limits. No mass or hydronephrosis visualized.  Left Kidney: Length: 12.1 cm. Echogenicity within normal limits. No mass or hydronephrosis visualized.  Abdominal aorta: No aneurysm visualized.  Other findings: None.  IMPRESSION: No acute hepatobiliary disease.  Previous cholecystectomy.  Mild diffuse hepatic steatosis without focal mass.   Electronically Signed   By: Elberta Fortisaniel  Boyle M.D.   On: 05/20/2014 10:14   Dg Chest Portable 1 View  05/17/2014   CLINICAL DATA:  Bradycardia and altered mental status  EXAM: PORTABLE CHEST - 1 VIEW  COMPARISON:  April 05, 2014  FINDINGS: There is no appreciable edema or consolidation. Heart is upper normal in size with pulmonary vascularity within normal limits. No adenopathy. No bone lesions.  IMPRESSION: No edema or consolidation.   Electronically Signed   By: Bretta BangWilliam  Woodruff M.D.   On: 05/17/2014 11:36   Dg Abd 2 Views  05/19/2014   CLINICAL DATA:  Abdominal pain.  EXAM: ABDOMEN - 2 VIEW  COMPARISON:  Single view of the abdomen 10/05/2009. CT abdomen and pelvis 06/12/2011.  FINDINGS: No free intraperitoneal air is identified. The bowel gas pattern is unremarkable. No abnormal abdominal calcification is seen. Fusion hardware lower lumbar spine is noted.  IMPRESSION: No acute finding.   Electronically Signed   By: Drusilla Kannerhomas  Dalessio M.D.   On: 05/19/2014 10:57     Microbiology: Recent Results (from the past 240 hour(s))  Culture, Urine     Status: None   Collection Time: 05/17/14  4:40 PM  Result  Value Ref Range Status   Specimen Description  URINE, CATHETERIZED  Final   Special Requests NONE  Final   Culture  Setup Time   Final    05/17/2014 22:39 Performed at [facility]    Colony Count NO GROWTH Performed at [facility]   Final   Culture NO GROWTH Performed at [facility]   Final   Report Status 05/18/2014 FINAL  Final  MRSA PCR Screening     Status: None   Collection Time: 05/17/14  4:53 PM  Result Value Ref Range Status   MRSA by PCR NEGATIVE NEGATIVE Final    Comment:        The GeneXpert MRSA Assay (FDA approved for NASAL specimens only), is one component of a comprehensive MRSA colonization surveillance program. It is not intended to diagnose MRSA infection nor to guide or monitor treatment for MRSA infections.      Labs: Basic Metabolic Panel:  Recent Labs Lab 05/17/14 1102 05/18/14 0401 05/20/14 0437 05/21/14 0356  NA 136* 138 142 139  K 4.8 4.7 3.4* 4.0  CL 98 101 103 103  CO2 23 25 24 24   GLUCOSE 212* 111* 109* 94  BUN 18 12 13 13   CREATININE 0.78 0.80 0.80 0.87  CALCIUM 9.1 9.4 8.9 8.9  MG  --   --   --  1.7   Liver Function Tests:  Recent Labs Lab 05/17/14 1102 05/18/14 0401 05/20/14 0437 05/21/14 0356  AST 37 20 159* 115*  ALT 37* 29 125* 200*  ALKPHOS 124* 112 138* 168*  BILITOT 0.4 0.7 1.9* 0.6  PROT 7.1 6.5 6.7 6.3  ALBUMIN 3.1* 3.0* 3.1* 2.9*   No results for input(s): LIPASE, AMYLASE in the last 168 hours. No results for input(s): AMMONIA in the last 168 hours. CBC:  Recent Labs Lab 05/17/14 1102 05/18/14 0401 05/19/14 0447 05/20/14 0437  WBC 12.8* 15.1* 14.0* 13.2*  NEUTROABS 9.3*  --   --   --   HGB 13.6 12.7 13.8 14.2  HCT 41.7 40.3 42.6 42.6  MCV 92.5 90.8 90.4 92.0  PLT 353 354 401* 364   Cardiac Enzymes:  Recent Labs Lab 05/17/14 1102  TROPONINI <0.30   BNP: Invalid input(s): POCBNP CBG: No results for input(s): GLUCAP in the last 168 hours.  Time coordinating  discharge:  Greater than 30 minutes  Signed:  Lawren Sexson, DO [facility] Pager: [phone] 05/21/2014, 11:08 AM

## 2014-08-04 ENCOUNTER — Other Ambulatory Visit: Payer: Self-pay | Admitting: Orthopedic Surgery

## 2014-08-04 DIAGNOSIS — M5441 Lumbago with sciatica, right side: Secondary | ICD-10-CM

## 2014-08-16 ENCOUNTER — Ambulatory Visit
Admission: RE | Admit: 2014-08-16 | Discharge: 2014-08-16 | Disposition: A | Discharge status: Home / Self Care | Payer: Medicare Other | Source: Ambulatory Visit | Attending: Orthopedic Surgery | Admitting: Orthopedic Surgery

## 2014-08-16 DIAGNOSIS — M5441 Lumbago with sciatica, right side: Secondary | ICD-10-CM

## 2014-08-16 MED ORDER — GADOBENATE DIMEGLUMINE 529 MG/ML IV SOLN
20.0000 mL | Freq: Once | INTRAVENOUS | Status: AC | PRN
  Administered 2014-08-16: 20 mL via INTRAVENOUS

## 2014-09-19 ENCOUNTER — Ambulatory Visit: Payer: Self-pay | Admitting: Cardiology

## 2014-11-27 ENCOUNTER — Emergency Department (HOSPITAL_COMMUNITY): Payer: Medicare Other

## 2014-11-27 ENCOUNTER — Encounter (HOSPITAL_COMMUNITY): Payer: Self-pay | Admitting: *Deleted

## 2014-11-27 ENCOUNTER — Emergency Department (HOSPITAL_COMMUNITY)
Admission: EM | Admit: 2014-11-27 | Discharge: 2014-11-28 | Disposition: A | Discharge status: Home / Self Care | Payer: Medicare Other | Attending: Emergency Medicine | Admitting: Emergency Medicine

## 2014-11-27 DIAGNOSIS — Z792 Long term (current) use of antibiotics: Secondary | ICD-10-CM | POA: Diagnosis not present

## 2014-11-27 DIAGNOSIS — G8929 Other chronic pain: Secondary | ICD-10-CM | POA: Insufficient documentation

## 2014-11-27 DIAGNOSIS — R0789 Other chest pain: Secondary | ICD-10-CM | POA: Diagnosis not present

## 2014-11-27 DIAGNOSIS — Z8719 Personal history of other diseases of the digestive system: Secondary | ICD-10-CM | POA: Insufficient documentation

## 2014-11-27 DIAGNOSIS — Z79899 Other long term (current) drug therapy: Secondary | ICD-10-CM | POA: Insufficient documentation

## 2014-11-27 DIAGNOSIS — Z880 Allergy status to penicillin: Secondary | ICD-10-CM | POA: Insufficient documentation

## 2014-11-27 DIAGNOSIS — R300 Dysuria: Secondary | ICD-10-CM | POA: Insufficient documentation

## 2014-11-27 DIAGNOSIS — E780 Pure hypercholesterolemia: Secondary | ICD-10-CM | POA: Insufficient documentation

## 2014-11-27 DIAGNOSIS — R0602 Shortness of breath: Secondary | ICD-10-CM | POA: Diagnosis not present

## 2014-11-27 DIAGNOSIS — E669 Obesity, unspecified: Secondary | ICD-10-CM | POA: Insufficient documentation

## 2014-11-27 DIAGNOSIS — Z720 Tobacco use: Secondary | ICD-10-CM | POA: Diagnosis not present

## 2014-11-27 DIAGNOSIS — F329 Major depressive disorder, single episode, unspecified: Secondary | ICD-10-CM | POA: Insufficient documentation

## 2014-11-27 DIAGNOSIS — F419 Anxiety disorder, unspecified: Secondary | ICD-10-CM | POA: Diagnosis not present

## 2014-11-27 DIAGNOSIS — R109 Unspecified abdominal pain: Secondary | ICD-10-CM | POA: Insufficient documentation

## 2014-11-27 DIAGNOSIS — R61 Generalized hyperhidrosis: Secondary | ICD-10-CM | POA: Insufficient documentation

## 2014-11-27 DIAGNOSIS — F1110 Opioid abuse, uncomplicated: Secondary | ICD-10-CM | POA: Diagnosis not present

## 2014-11-27 DIAGNOSIS — I10 Essential (primary) hypertension: Secondary | ICD-10-CM | POA: Insufficient documentation

## 2014-11-27 DIAGNOSIS — Z8742 Personal history of other diseases of the female genital tract: Secondary | ICD-10-CM | POA: Insufficient documentation

## 2014-11-27 DIAGNOSIS — E039 Hypothyroidism, unspecified: Secondary | ICD-10-CM | POA: Insufficient documentation

## 2014-11-27 DIAGNOSIS — R079 Chest pain, unspecified: Secondary | ICD-10-CM

## 2014-11-27 DIAGNOSIS — M2550 Pain in unspecified joint: Secondary | ICD-10-CM | POA: Diagnosis not present

## 2014-11-27 LAB — COMPREHENSIVE METABOLIC PANEL
ALBUMIN: 3.4 g/dL — AB (ref 3.5–5.0)
ALK PHOS: 81 U/L (ref 38–126)
ALT: 162 U/L — AB (ref 14–54)
AST: 288 U/L — AB (ref 15–41)
Anion gap: 10 (ref 5–15)
BUN: 14 mg/dL (ref 6–20)
CHLORIDE: 104 mmol/L (ref 101–111)
CO2: 27 mmol/L (ref 22–32)
Calcium: 9.4 mg/dL (ref 8.9–10.3)
Creatinine, Ser: 0.95 mg/dL (ref 0.44–1.00)
GFR calc non Af Amer: 59 mL/min — ABNORMAL LOW (ref >60)
Glucose, Bld: 127 mg/dL — ABNORMAL HIGH (ref 65–99)
Potassium: 3.7 mmol/L (ref 3.5–5.1)
Sodium: 141 mmol/L (ref 135–145)
Total Bilirubin: 1 mg/dL (ref 0.3–1.2)
Total Protein: 6.8 g/dL (ref 6.5–8.1)

## 2014-11-27 LAB — URINALYSIS, ROUTINE W REFLEX MICROSCOPIC
BILIRUBIN URINE: NEGATIVE
GLUCOSE, UA: NEGATIVE mg/dL
Hgb urine dipstick: NEGATIVE
KETONES UR: NEGATIVE mg/dL
Leukocytes, UA: NEGATIVE
Nitrite: NEGATIVE
PH: 7 (ref 5.0–8.0)
Protein, ur: NEGATIVE mg/dL
SPECIFIC GRAVITY, URINE: 1.008 (ref 1.005–1.030)
UROBILINOGEN UA: 0.2 mg/dL (ref 0.0–1.0)

## 2014-11-27 LAB — RAPID URINE DRUG SCREEN, HOSP PERFORMED
Amphetamines: NOT DETECTED
BENZODIAZEPINES: NOT DETECTED
Barbiturates: NOT DETECTED
Cocaine: NOT DETECTED
Opiates: POSITIVE — AB
TETRAHYDROCANNABINOL: NOT DETECTED

## 2014-11-27 LAB — CBC
HEMATOCRIT: 45.5 % (ref 36.0–46.0)
Hemoglobin: 15.2 g/dL — ABNORMAL HIGH (ref 12.0–15.0)
MCH: 29.6 pg (ref 26.0–34.0)
MCHC: 33.4 g/dL (ref 30.0–36.0)
MCV: 88.5 fL (ref 78.0–100.0)
PLATELETS: 360 10*3/uL (ref 150–400)
RBC: 5.14 MIL/uL — ABNORMAL HIGH (ref 3.87–5.11)
RDW: 14.2 % (ref 11.5–15.5)
WBC: 14.7 10*3/uL — ABNORMAL HIGH (ref 4.0–10.5)

## 2014-11-27 LAB — ACETAMINOPHEN LEVEL

## 2014-11-27 LAB — LIPASE, BLOOD: LIPASE: 16 U/L — AB (ref 22–51)

## 2014-11-27 LAB — ETHANOL: Alcohol, Ethyl (B): <5 mg/dL (ref <5)

## 2014-11-27 LAB — I-STAT TROPONIN, ED
Troponin i, poc: 0 ng/mL (ref 0.00–0.08)
Troponin i, poc: 0.01 ng/mL (ref 0.00–0.08)

## 2014-11-27 LAB — BRAIN NATRIURETIC PEPTIDE: B NATRIURETIC PEPTIDE 5: 38.8 pg/mL (ref 0.0–100.0)

## 2014-11-27 LAB — I-STAT CG4 LACTIC ACID, ED: Lactic Acid, Venous: 1.61 mmol/L (ref 0.5–2.0)

## 2014-11-27 MED ORDER — IOHEXOL 300 MG/ML  SOLN
100.0000 mL | Freq: Once | INTRAMUSCULAR | Status: AC | PRN
  Administered 2014-11-27: 100 mL via INTRAVENOUS

## 2014-11-27 NOTE — ED Notes (Signed)
Patient transported to X-ray 

## 2014-11-27 NOTE — ED Provider Notes (Signed)
CSN: [id number]     Arrival date & time 11/27/14  1817 History   First MD Initiated Contact with Patient 11/27/14 1819     Chief Complaint  Patient presents with  . Chest Pain     (Consider location/radiation/quality/duration/timing/severity/associated sxs/prior Treatment) The history is provided by the patient and medical records. No language interpreter was used.     [name] [name] is a 74 y.o. female  with a hx of hypertension, aortic dilation, depression, neurogenic bladder, chronic lower back pain, anxiety, multiple sclerosis, high cholesterol presents to the Emergency Department complaining of gradual, persistent, progressively worsening left sided chest pain that radiates to the left arm onset sometime this afternoon.  Pt reports the pain is dull in nature.  Pt reports she was diagnosed with a UTI and bronchitis 3 days ago and was put on Cipro. Associated symptoms include diaphoresis, weakness, dizziness, SOB, nausea, generalized abd pain.  Nothing makes it better and nothing makes it worse.  Pt denies fever, chills, headache, neck pain, vomiting, diarrhea, syncope.  Pt was given ASA and nitro by EMS and reports the pain is now a 4/10.     Past Medical History  Diagnosis Date  . HTN (hypertension)   . Ascending aortic aneurysm   . Dyslipidemia   . Tobacco dependence   . Hypothyroidism   . Depression   . Neurogenic bladder   . Obesity   . Dog bite(E906.0)     RLL with systemic inflammatory response  . Chronic low back pain   . Anxiety   . MS (multiple sclerosis)     Followed by Dr. Sandria Manly  . High cholesterol   . Aortic aneurysm   . Complication of anesthesia   . PONV (postoperative nausea and vomiting)   . Aortic aneurysm   . Numbness     "different spots"  . Arthritis   . History of ulcer disease 30 yrs ago  . IBS (irritable bowel syndrome)   . Incontinence in female   . Obstructive sleep apnea     not currently using c pap   Past Surgical History  Procedure  Laterality Date  . Cholecystectomy    . Hysterectomy    . Nasal sinus surgery    . Left eye surgery    . Bilateral foot surgery    . Leg surgery  2006    due to fx leg   . Back surgery    . Total knee arthroplasty Left 04/11/2014    Procedure: LEFT TOTAL KNEE ARTHROPLASTY;  Surgeon: Shelda Pal, MD;  Location: [facility];  Service: Orthopedics;  Laterality: Left;   No family history on file. History  Substance Use Topics  . Smoking status: Current Every Day Smoker -- 0.50 packs/day for 30 years    Types: Cigarettes  . Smokeless tobacco: Never Used  . Alcohol Use: Yes     Comment: Rare alcohol use   OB History    No data available     Review of Systems  Constitutional: Positive for diaphoresis. Negative for fever, appetite change, fatigue and unexpected weight change.  HENT: Negative for mouth sores.   Eyes: Negative for visual disturbance.  Respiratory: Positive for shortness of breath. Negative for cough, chest tightness and wheezing.   Cardiovascular: Positive for chest pain.  Gastrointestinal: Negative for nausea, vomiting, abdominal pain, diarrhea and constipation.  Endocrine: Negative for polydipsia, polyphagia and polyuria.  Genitourinary: Positive for dysuria. Negative for urgency, frequency and hematuria.  Musculoskeletal: Positive for arthralgias (  left arm). Negative for back pain and neck stiffness.  Skin: Negative for rash.  Allergic/Immunologic: Negative for immunocompromised state.  Neurological: Positive for light-headedness. Negative for syncope and headaches.  Hematological: Does not bruise/bleed easily.  Psychiatric/Behavioral: Negative for sleep disturbance. The patient is not nervous/anxious.       Allergies  Codeine; Lisinopril; Demerol; and Penicillins  Home Medications   Prior to Admission medications   Medication Sig Start Date End Date Taking? Authorizing Provider  amLODipine (NORVASC) 5 MG tablet Take 5 mg by mouth daily.   Yes Historical  Provider, MD  atorvastatin (LIPITOR) 20 MG tablet Take 20 mg by mouth daily.   Yes Historical Provider, MD  baclofen (LIORESAL) 10 MG tablet Take 10 mg by mouth 2 (two) times daily as needed for muscle spasms.   Yes Historical Provider, MD  buPROPion (WELLBUTRIN XL) 300 MG 24 hr tablet Take 1 tablet by mouth daily.  01/17/14  Yes Historical Provider, MD  Cholecalciferol (VITAMIN D) 2000 UNITS tablet Take 4,000 Units by mouth 2 (two) times daily.    Yes Historical Provider, MD  ciprofloxacin (CIPRO) 250 MG tablet Take 250 mg by mouth 2 (two) times daily. Started medication on 11-25-14   Yes Historical Provider, MD  clonazePAM (KLONOPIN) 1 MG tablet Take 1 mg by mouth 3 (three) times daily.   Yes Historical Provider, MD  Cyanocobalamin (VITAMIN B 12 PO) Take 20,000-25,000 mcg by mouth every morning.   Yes Historical Provider, MD  diphenoxylate-atropine (LOMOTIL) 2.5-0.025 MG per tablet Take 3 tablets by mouth 2 (two) times daily as needed for diarrhea or loose stools.   Yes Historical Provider, MD  escitalopram (LEXAPRO) 20 MG tablet Take 20 mg by mouth daily.   Yes Historical Provider, MD  FLUoxetine (PROZAC) 40 MG capsule Take 40 mg by mouth every morning.    Yes Historical Provider, MD  gabapentin (NEURONTIN) 100 MG capsule Take 100-200 mg by mouth 2 (two) times daily.    Yes Historical Provider, MD  levothyroxine (SYNTHROID, LEVOTHROID) 200 MCG tablet Take 200 mcg by mouth daily before breakfast.   Yes Historical Provider, MD  losartan (COZAAR) 100 MG tablet Take 1 tablet by mouth every morning.  10/15/11  Yes Historical Provider, MD  omeprazole (PRILOSEC) 20 MG capsule Take 20 mg by mouth daily.   Yes Historical Provider, MD  pantoprazole (PROTONIX) 40 MG tablet Take 40 mg by mouth 2 (two) times daily.   Yes Historical Provider, MD  pravastatin (PRAVACHOL) 40 MG tablet Take 40 mg by mouth at bedtime.   Yes Historical Provider, MD  clonazePAM (KLONOPIN) 0.5 MG tablet Take 0.5 tablets (0.25 mg total)  by mouth 3 (three) times daily as needed (anxiety). Patient not taking: Reported on 11/27/2014 05/21/14   Catarina Hartshorn, MD  levofloxacin (LEVAQUIN) 500 MG tablet Take 1 tablet (500 mg total) by mouth daily. Patient not taking: Reported on 11/27/2014 05/21/14   Catarina Hartshorn, MD  oxyCODONE (OXY IR/ROXICODONE) 5 MG immediate release tablet Take 1-2 tablets (5-10 mg total) by mouth every 6 (six) hours as needed for severe pain. Patient not taking: Reported on 11/27/2014 05/21/14   Catarina Hartshorn, MD  polyethylene glycol [facility] / Ethelene Hal) packet Take 17 g by mouth 2 (two) times daily. Patient not taking: Reported on 11/27/2014 04/13/14   Lanney Gins, PA-C  tiotropium (SPIRIVA) 18 MCG inhalation capsule Place 1 capsule (18 mcg total) into inhaler and inhale daily as needed (shortness of breath.). Patient not taking: Reported on 11/27/2014 05/21/14  Catarina Hartshorn, MD   BP 106/67 mmHg  Pulse 70  Temp(Src) 98 F (36.7 C) (Oral)  Resp 19  Ht  (1.6 m)  Wt 219 lb (99.338 kg)  BMI 38.80 kg/m2  SpO2 94% Physical Exam  Constitutional: She is oriented to person, place, and time. She appears well-developed and well-nourished. No distress.  Awake, alert, nontoxic appearance  HENT:  Head: Normocephalic and atraumatic.  Mouth/Throat: Oropharynx is clear and moist. No oropharyngeal exudate.  Eyes: Conjunctivae are normal. No scleral icterus.  Neck: Normal range of motion. Neck supple.  Cardiovascular: Normal rate, regular rhythm, normal heart sounds and intact distal pulses.   Pulmonary/Chest: Effort normal and breath sounds normal. No respiratory distress. She has no wheezes.  Equal chest expansion Clear and equal breath sounds  Abdominal: Soft. Bowel sounds are normal. She exhibits no mass. There is no tenderness. There is no rebound and no guarding.  Obese Soft and nontender  Musculoskeletal: Normal range of motion. She exhibits no edema.  Neurological: She is alert and oriented to person, place, and time.   Speech is clear and goal oriented Moves extremities without ataxia  Skin: Skin is warm and dry. She is not diaphoretic. No erythema.  Psychiatric: She has a normal mood and affect.  Nursing note and vitals reviewed.   ED Course  Procedures (including critical care time) Labs Review Labs Reviewed  CBC - Abnormal; Notable for the following:    WBC 14.7 (*)    RBC 5.14 (*)    Hemoglobin 15.2 (*)    All other components within normal limits  COMPREHENSIVE METABOLIC PANEL - Abnormal; Notable for the following:    Glucose, Bld 127 (*)    Albumin 3.4 (*)    AST 288 (*)    ALT 162 (*)    GFR calc non Af Amer 59 (*)    All other components within normal limits  URINE RAPID DRUG SCREEN (HOSP PERFORMED) NOT AT [facility] - Abnormal; Notable for the following:    Opiates POSITIVE (*)    All other components within normal limits  LIPASE, BLOOD - Abnormal; Notable for the following:    Lipase 16 (*)    All other components within normal limits  ACETAMINOPHEN LEVEL - Abnormal; Notable for the following:    Acetaminophen (Tylenol), Serum <10 (*)    All other components within normal limits  BRAIN NATRIURETIC PEPTIDE  URINALYSIS, ROUTINE W REFLEX MICROSCOPIC (NOT AT [facility])  ETHANOL  I-STAT TROPOININ, ED  I-STAT CG4 LACTIC ACID, ED  Rosezena Sensor, ED    Imaging Review Dg Chest 2 View  11/27/2014   CLINICAL DATA:  Mid chest pain and left arm pain.  EXAM: CHEST  2 VIEW  COMPARISON:  05/17/2014 and 04/05/2014  FINDINGS: The heart size and pulmonary vascularity are normal and the lungs are clear. Tortuosity and calcification of thoracic aorta. No osseous abnormality.  IMPRESSION: No acute abnormality.  No change since the prior exams.   Electronically Signed   By: Francene Boyers M.D.   On: 11/27/2014 19:19   Ct Chest W Contrast  11/27/2014   CLINICAL DATA:  Mid chest pain, shortness of breath, nausea and abdominal pain. Symptoms for 1 day.  EXAM: CT CHEST, ABDOMEN, AND PELVIS WITH CONTRAST   TECHNIQUE: Multidetector CT imaging of the chest, abdomen and pelvis was performed following the standard protocol during bolus administration of intravenous contrast.  CONTRAST:  OMNIPAQUE IOHEXOL 300 MG/ML  SOLN  COMPARISON:  Chest radiographs earlier  this day. CT 07/17/2012  FINDINGS: CT CHEST FINDINGS  There is prominence of the ascending aorta, maximal dimension, this is unchanged from prior exam. Transverse and descending thoracic aorta are normal in caliber. There is moderate atherosclerotic calcification of is unchanged. No dissection or para-aortic stranding. The heart size is normal. There are coronary artery calcifications. No filling defects in the central pulmonary arteries. There is no mediastinal or hilar adenopathy. No pleural or pericardial effusion. Lipomatous hypertrophy at the intra-atrial septum is noted.  Trace hypoventilatory change at the lung bases. No consolidation to suggest pneumonia. No pulmonary nodule or mass. The right lower lobe 4 mm nodule on prior exam is not seen.  There is an 8 mm nodule in the upper right breast. Retroareolar nodular density is unchanged from prior CT. No axillary adenopathy.  There are no acute or suspicious osseous abnormalities.  CT ABDOMEN AND PELVIS FINDINGS  Postcholecystectomy. There is biliary prominence, the common bile duct measures 2.1 cm in greatest dimension. There is intrahepatic biliary ductal dilatation, left greater than right. No calcified choledocholithiasis. There is no focal hepatic lesion. 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 Mild pancreatic atrophy without ductal dilatation or peripancreatic inflammatory change. 5 mm hypodensity in the upper spleen, unchanged from prior. The adrenal glands are normal. Kidneys demonstrate symmetric enhancement and excretion without hydronephrosis or perinephric stranding. Simple cyst in the lower right kidney is again seen.  The  stomach is physiologically distended. There are no dilated or thickened bowel loops. The appendix is not definitively identified, no inflammatory change in the right lower quadrant to suggest appendicitis. There is a moderate volume of colonic stool without colonic wall thickening. Mild diverticulosis of the distal colon without diverticulitis. No free air, free fluid, or intra-abdominal fluid collection.  No retroperitoneal adenopathy. Abdominal aorta is normal in caliber. There is moderate diffuse atherosclerosis of the abdominal aorta and its branches. No aneurysm.  Within the pelvis the urinary bladder is near completely decompressed, question of bladder wall thickening. Uterus is surgically absent. There is no adnexal mass. There is no pelvic free fluid.  There are no acute or suspicious osseous abnormalities. There is postsurgical change in the lower lumbar spine.  IMPRESSION: 1. No acute intrathoracic abnormality. 2. Progressive dilatation of the extrahepatic biliary tree postcholecystectomy. No calcified choledocholithiasis. Correlation with LFTs recommended. If there is clinical concern for biliary obstruction, MRCP could be considered. 3. Question of bladder wall thickening versus nondistention. Correlation with urinalysis recommended. 4. There is unchanged prominence of the ascending thoracic aorta, 3.9 cm. This is unchanged over the course of 2 years. 5. Right breast 8 mm nodule centrally. Recommend up-to-date mammography. 6. Diverticulosis without diverticulitis. Unchanged common hepatic artery aneurysm.   Electronically Signed   By: Rubye Oaks M.D.   On: 11/27/2014 21:49   Ct Abdomen Pelvis W Contrast  11/27/2014   CLINICAL DATA:  Mid chest pain, shortness of breath, nausea and abdominal pain. Symptoms for 1 day.  EXAM: CT CHEST, ABDOMEN, AND PELVIS WITH CONTRAST  TECHNIQUE: Multidetector CT imaging of the chest, abdomen and pelvis was performed following the standard protocol during bolus  administration of intravenous contrast.  CONTRAST:  OMNIPAQUE IOHEXOL 300 MG/ML  SOLN  COMPARISON:  Chest radiographs earlier this day. CT 07/17/2012  FINDINGS: CT CHEST FINDINGS  There is prominence of the ascending aorta, maximal dimension, this is unchanged from prior exam. Transverse and descending thoracic aorta are normal in  caliber. There is moderate atherosclerotic calcification of is unchanged. No dissection or para-aortic stranding. The heart size is normal. There are coronary artery calcifications. No filling defects in the central pulmonary arteries. There is no mediastinal or hilar adenopathy. No pleural or pericardial effusion. Lipomatous hypertrophy at the intra-atrial septum is noted.  Trace hypoventilatory change at the lung bases. No consolidation to suggest pneumonia. No pulmonary nodule or mass. The right lower lobe 4 mm nodule on prior exam is not seen.  There is an 8 mm nodule in the upper right breast. Retroareolar nodular density is unchanged from prior CT. No axillary adenopathy.  There are no acute or suspicious osseous abnormalities.  CT ABDOMEN AND PELVIS FINDINGS  Postcholecystectomy. There is biliary prominence, the common bile duct measures 2.1 cm in greatest dimension. There is intrahepatic biliary ductal dilatation, left greater than right. No calcified choledocholithiasis. There is no focal hepatic lesion. 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 Mild pancreatic atrophy without ductal dilatation or peripancreatic inflammatory change. 5 mm hypodensity in the upper spleen, unchanged from prior. The adrenal glands are normal. Kidneys demonstrate symmetric enhancement and excretion without hydronephrosis or perinephric stranding. Simple cyst in the lower right kidney is again seen.  The stomach is physiologically distended. There are no dilated or thickened bowel loops. The appendix is not definitively identified, no  inflammatory change in the right lower quadrant to suggest appendicitis. There is a moderate volume of colonic stool without colonic wall thickening. Mild diverticulosis of the distal colon without diverticulitis. No free air, free fluid, or intra-abdominal fluid collection.  No retroperitoneal adenopathy. Abdominal aorta is normal in caliber. There is moderate diffuse atherosclerosis of the abdominal aorta and its branches. No aneurysm.  Within the pelvis the urinary bladder is near completely decompressed, question of bladder wall thickening. Uterus is surgically absent. There is no adnexal mass. There is no pelvic free fluid.  There are no acute or suspicious osseous abnormalities. There is postsurgical change in the lower lumbar spine.  IMPRESSION: 1. No acute intrathoracic abnormality. 2. Progressive dilatation of the extrahepatic biliary tree postcholecystectomy. No calcified choledocholithiasis. Correlation with LFTs recommended. If there is clinical concern for biliary obstruction, MRCP could be considered. 3. Question of bladder wall thickening versus nondistention. Correlation with urinalysis recommended. 4. There is unchanged prominence of the ascending thoracic aorta, 3.9 cm. This is unchanged over the course of 2 years. 5. Right breast 8 mm nodule centrally. Recommend up-to-date mammography. 6. Diverticulosis without diverticulitis. Unchanged common hepatic artery aneurysm.   Electronically Signed   By: Rubye Oaks M.D.   On: 11/27/2014 21:49     EKG Interpretation   Date/Time:  Sunday November 27 2014 18:42:38 EDT Ventricular Rate:  58 PR Interval:  187 QRS Duration: 139 QT Interval:  488 QTC Calculation: 479 R Axis:   -57 Text Interpretation:  Sinus rhythm Right bundle branch block Inferior  infarct, old No significant change since last tracing Confirmed by YAO   MD, DAVID ([id number]) on 11/27/2014 7:06:57 PM      MDM   Final diagnoses:  Abdominal pain  Chest pain, unspecified chest  pain type   [name] sensor chest pain onset sometime this afternoon. Patient is a poor historian. She is gives vague description of her chest pain and has unknown onset.  He began chest pain workup.  Patient is well-appearing at this time.  EKG with right bundle branch block and no changes  from last.  8:19 PM Labs with elevated AST/ALT higher than previous, mild leukocytosis (elevated in the past).  UA without evidence of UTI.  Negative troponin.   CT abdomen pelvis with progressive dilation of the extrahepatic biliary tree with some increase in her LFTs. Patient is without vomiting here in the emergency department and no significant right upper quadrant abdominal tenderness.  She will need to see GI for planned MRCP.  This report she is feeling better. Will obtain delta troponin.  12:02AM Repeat troponin negative. Patient is eating and drinking without difficulty. She is ambulatory without assistance and reports she feels well and wishes for discharge home. Discussed findings of her CT scan. Recommend PCP follow-up along with GI follow-up. She and daughter state understanding. Also discussed strict reasons to return to the emergency department including return of chest pain, return of abdominal pain or vomiting.  BP 106/67 mmHg  Pulse 70  Temp(Src) 98 F (36.7 C) (Oral)  Resp 19  Ht 5\' 3"  (1.6 m)  Wt 219 lb (99.338 kg)  BMI 38.80 kg/m2  SpO2 94%   The patient was discussed with and seen by Dr. Silverio Lay who agrees with the treatment plan.     Dahlia Client Teren Zurcher, PA-C 11/28/14 [id number]  Richardean Canal, MD 11/30/14 [phone]

## 2014-11-27 NOTE — ED Notes (Signed)
Patient transported to CT 

## 2014-11-27 NOTE — ED Notes (Signed)
Pt reports that the sl nitro did not do anything for her pain

## 2014-11-27 NOTE — ED Notes (Signed)
The pt arrived by [facility] from home.  C/o mid-chest pain with lt arm radiation this afternoon.  Iv per ems.  The pt was given aspirin 324mg  and sl nitro  Without relief from pain   She was seen Friday by her doctor and treated for a uti with antibiotics

## 2014-11-28 NOTE — Discharge Instructions (Signed)
1. Medications: usual home medications 2. Treatment: rest, drink plenty of fluids, advance diet slowly 3. Follow Up: Please followup with your primary doctor in 2-3 days for discussion of your diagnoses and further evaluation after today's visit; please follow-up with gastroenterology for further discussion of your dilated common bile duct; Please return to the ER for worsening symptoms, high fevers, persistent vomiting, return of chest pain or other concerns    Chest Pain (Nonspecific) It is often hard to give a specific diagnosis for the cause of chest pain. There is always a chance that your pain could be related to something serious, such as a heart attack or a blood clot in the lungs. You need to follow up with your health care provider for further evaluation. CAUSES   Heartburn.  Pneumonia or bronchitis.  Anxiety or stress.  Inflammation around your heart (pericarditis) or lung (pleuritis or pleurisy).  A blood clot in the lung.  A collapsed lung (pneumothorax). It can develop suddenly on its own (spontaneous pneumothorax) or from trauma to the chest.  Shingles infection (herpes zoster virus). The chest wall is composed of bones, muscles, and cartilage. Any of these can be the source of the pain.  The bones can be bruised by injury.  The muscles or cartilage can be strained by coughing or overwork.  The cartilage can be affected by inflammation and become sore (costochondritis). DIAGNOSIS  Lab tests or other studies may be needed to find the cause of your pain. Your health care provider may have you take a test called an ambulatory electrocardiogram (ECG). An ECG records your heartbeat patterns over a 24-hour period. You may also have other tests, such as:  Transthoracic echocardiogram (TTE). During echocardiography, sound waves are used to evaluate how blood flows through your heart.  Transesophageal echocardiogram (TEE).  Cardiac monitoring. This allows your health care  provider to monitor your heart rate and rhythm in real time.  Holter monitor. This is a portable device that records your heartbeat and can help diagnose heart arrhythmias. It allows your health care provider to track your heart activity for several days, if needed.  Stress tests by exercise or by giving medicine that makes the heart beat faster. TREATMENT   Treatment depends on what may be causing your chest pain. Treatment may include:  Acid blockers for heartburn.  Anti-inflammatory medicine.  Pain medicine for inflammatory conditions.  Antibiotics if an infection is present.  You may be advised to change lifestyle habits. This includes stopping smoking and avoiding alcohol, caffeine, and chocolate.  You may be advised to keep your head raised (elevated) when sleeping. This reduces the chance of acid going backward from your stomach into your esophagus. Most of the time, nonspecific chest pain will improve within 2-3 days with rest and mild pain medicine.  HOME CARE INSTRUCTIONS   If antibiotics were prescribed, take them as directed. Finish them even if you start to feel better.  For the next few days, avoid physical activities that bring on chest pain. Continue physical activities as directed.  Do not use any tobacco products, including cigarettes, chewing tobacco, or electronic cigarettes.  Avoid drinking alcohol.  Only take medicine as directed by your health care provider.  Follow your health care provider's suggestions for further testing if your chest pain does not go away.  Keep any follow-up appointments you made. If you do not go to an appointment, you could develop lasting (chronic) problems with pain. If there is any problem keeping an appointment,  call to reschedule. SEEK MEDICAL CARE IF:   Your chest pain does not go away, even after treatment.  You have a rash with blisters on your chest.  You have a fever. SEEK IMMEDIATE MEDICAL CARE IF:   You have  increased chest pain or pain that spreads to your arm, neck, jaw, back, or abdomen.  You have shortness of breath.  You have an increasing cough, or you cough up blood.  You have severe back or abdominal pain.  You feel nauseous or vomit.  You have severe weakness.  You faint.  You have chills. This is an emergency. Do not wait to see if the pain will go away. Get medical help at once. Call your local emergency services (911 in [place] Do not drive yourself to the hospital. MAKE SURE YOU:   Understand these instructions.  Will watch your condition.  Will get help right away if you are not doing well or get worse. Document Released: 03/20/2005 Document Revised: 06/15/2013 Document Reviewed: 01/14/2008 [facility] Patient Information 2015 [place], [place]. This information is not intended to replace advice given to you by your health care provider. Make sure you discuss any questions you have with your health care provider.

## 2014-12-12 ENCOUNTER — Encounter: Payer: Self-pay | Admitting: Internal Medicine

## 2014-12-12 ENCOUNTER — Telehealth: Payer: Self-pay | Admitting: *Deleted

## 2014-12-12 NOTE — Telephone Encounter (Signed)
Patient Name  [name] [name]  Email Address  [email or web address]  Date of Birth  1941-02-28  Subject  Appointment  Message  Yes [facility] emergency room doctor suggested I get an appointment.  I have enzymes in my liver too high and scan revealing problems from old gallbladder surgery.  Please give me a phone call sometime early afternoon on Monday 6\20/2016 to set up appointment with Dr. Juanda Chance. My phone number is: [phone].  Thank you, [name]

## 2014-12-16 ENCOUNTER — Inpatient Hospital Stay (HOSPITAL_COMMUNITY)
Admission: EM | Admit: 2014-12-16 | Discharge: 2014-12-22 | DRG: 066 | Disposition: A | Discharge status: Skilled Nursing Facility | Payer: Medicare Other | Attending: Internal Medicine | Admitting: Internal Medicine

## 2014-12-16 ENCOUNTER — Emergency Department (HOSPITAL_COMMUNITY): Payer: Medicare Other

## 2014-12-16 ENCOUNTER — Encounter (HOSPITAL_COMMUNITY): Payer: Self-pay | Admitting: *Deleted

## 2014-12-16 DIAGNOSIS — Z79899 Other long term (current) drug therapy: Secondary | ICD-10-CM

## 2014-12-16 DIAGNOSIS — J209 Acute bronchitis, unspecified: Secondary | ICD-10-CM | POA: Diagnosis present

## 2014-12-16 DIAGNOSIS — G629 Polyneuropathy, unspecified: Secondary | ICD-10-CM | POA: Diagnosis present

## 2014-12-16 DIAGNOSIS — M25522 Pain in left elbow: Secondary | ICD-10-CM | POA: Diagnosis present

## 2014-12-16 DIAGNOSIS — F17210 Nicotine dependence, cigarettes, uncomplicated: Secondary | ICD-10-CM | POA: Diagnosis present

## 2014-12-16 DIAGNOSIS — Y92009 Unspecified place in unspecified non-institutional (private) residence as the place of occurrence of the external cause: Secondary | ICD-10-CM

## 2014-12-16 DIAGNOSIS — G4733 Obstructive sleep apnea (adult) (pediatric): Secondary | ICD-10-CM | POA: Diagnosis present

## 2014-12-16 DIAGNOSIS — Z6839 Body mass index (BMI) 39.0-39.9, adult: Secondary | ICD-10-CM

## 2014-12-16 DIAGNOSIS — Z66 Do not resuscitate: Secondary | ICD-10-CM | POA: Diagnosis present

## 2014-12-16 DIAGNOSIS — G894 Chronic pain syndrome: Secondary | ICD-10-CM | POA: Diagnosis present

## 2014-12-16 DIAGNOSIS — F319 Bipolar disorder, unspecified: Secondary | ICD-10-CM | POA: Diagnosis present

## 2014-12-16 DIAGNOSIS — Z8249 Family history of ischemic heart disease and other diseases of the circulatory system: Secondary | ICD-10-CM

## 2014-12-16 DIAGNOSIS — E785 Hyperlipidemia, unspecified: Secondary | ICD-10-CM | POA: Diagnosis present

## 2014-12-16 DIAGNOSIS — Z888 Allergy status to other drugs, medicaments and biological substances status: Secondary | ICD-10-CM

## 2014-12-16 DIAGNOSIS — E669 Obesity, unspecified: Secondary | ICD-10-CM | POA: Diagnosis present

## 2014-12-16 DIAGNOSIS — I639 Cerebral infarction, unspecified: Secondary | ICD-10-CM

## 2014-12-16 DIAGNOSIS — Z90710 Acquired absence of both cervix and uterus: Secondary | ICD-10-CM

## 2014-12-16 DIAGNOSIS — E039 Hypothyroidism, unspecified: Secondary | ICD-10-CM | POA: Diagnosis present

## 2014-12-16 DIAGNOSIS — F17200 Nicotine dependence, unspecified, uncomplicated: Secondary | ICD-10-CM | POA: Diagnosis present

## 2014-12-16 DIAGNOSIS — R079 Chest pain, unspecified: Secondary | ICD-10-CM | POA: Insufficient documentation

## 2014-12-16 DIAGNOSIS — E780 Pure hypercholesterolemia: Secondary | ICD-10-CM | POA: Diagnosis present

## 2014-12-16 DIAGNOSIS — I4510 Unspecified right bundle-branch block: Secondary | ICD-10-CM | POA: Diagnosis present

## 2014-12-16 DIAGNOSIS — Z7982 Long term (current) use of aspirin: Secondary | ICD-10-CM

## 2014-12-16 DIAGNOSIS — I712 Thoracic aortic aneurysm, without rupture: Secondary | ICD-10-CM | POA: Diagnosis present

## 2014-12-16 DIAGNOSIS — W19XXXA Unspecified fall, initial encounter: Secondary | ICD-10-CM | POA: Diagnosis present

## 2014-12-16 DIAGNOSIS — Z96652 Presence of left artificial knee joint: Secondary | ICD-10-CM | POA: Diagnosis present

## 2014-12-16 DIAGNOSIS — G35 Multiple sclerosis: Secondary | ICD-10-CM | POA: Diagnosis present

## 2014-12-16 DIAGNOSIS — N319 Neuromuscular dysfunction of bladder, unspecified: Secondary | ICD-10-CM | POA: Diagnosis present

## 2014-12-16 DIAGNOSIS — M1990 Unspecified osteoarthritis, unspecified site: Secondary | ICD-10-CM | POA: Diagnosis present

## 2014-12-16 DIAGNOSIS — K589 Irritable bowel syndrome without diarrhea: Secondary | ICD-10-CM | POA: Diagnosis present

## 2014-12-16 DIAGNOSIS — F419 Anxiety disorder, unspecified: Secondary | ICD-10-CM | POA: Diagnosis present

## 2014-12-16 DIAGNOSIS — I10 Essential (primary) hypertension: Secondary | ICD-10-CM | POA: Diagnosis present

## 2014-12-16 DIAGNOSIS — I63411 Cerebral infarction due to embolism of right middle cerebral artery: Principal | ICD-10-CM | POA: Diagnosis present

## 2014-12-16 DIAGNOSIS — M25529 Pain in unspecified elbow: Secondary | ICD-10-CM

## 2014-12-16 DIAGNOSIS — E119 Type 2 diabetes mellitus without complications: Secondary | ICD-10-CM | POA: Diagnosis present

## 2014-12-16 DIAGNOSIS — Z880 Allergy status to penicillin: Secondary | ICD-10-CM

## 2014-12-16 DIAGNOSIS — R55 Syncope and collapse: Secondary | ICD-10-CM | POA: Diagnosis present

## 2014-12-16 DIAGNOSIS — Z885 Allergy status to narcotic agent status: Secondary | ICD-10-CM

## 2014-12-16 LAB — CBC WITH DIFFERENTIAL/PLATELET
BASOS PCT: 0 % (ref 0–1)
Basophils Absolute: 0 10*3/uL (ref 0.0–0.1)
EOS PCT: 0 % (ref 0–5)
Eosinophils Absolute: 0 10*3/uL (ref 0.0–0.7)
HEMATOCRIT: 46.3 % — AB (ref 36.0–46.0)
Hemoglobin: 15.1 g/dL — ABNORMAL HIGH (ref 12.0–15.0)
Lymphocytes Relative: 20 % (ref 12–46)
Lymphs Abs: 2.7 10*3/uL (ref 0.7–4.0)
MCH: 29.3 pg (ref 26.0–34.0)
MCHC: 32.6 g/dL (ref 30.0–36.0)
MCV: 89.7 fL (ref 78.0–100.0)
MONO ABS: 1.3 10*3/uL — AB (ref 0.1–1.0)
MONOS PCT: 9 % (ref 3–12)
NEUTROS PCT: 71 % (ref 43–77)
Neutro Abs: 9.8 10*3/uL — ABNORMAL HIGH (ref 1.7–7.7)
Platelets: 322 10*3/uL (ref 150–400)
RBC: 5.16 MIL/uL — ABNORMAL HIGH (ref 3.87–5.11)
RDW: 14.3 % (ref 11.5–15.5)
WBC: 13.9 10*3/uL — ABNORMAL HIGH (ref 4.0–10.5)

## 2014-12-16 LAB — COMPREHENSIVE METABOLIC PANEL
ALT: 81 U/L — AB (ref 14–54)
AST: 123 U/L — AB (ref 15–41)
Albumin: 3.9 g/dL (ref 3.5–5.0)
Alkaline Phosphatase: 71 U/L (ref 38–126)
Anion gap: 9 (ref 5–15)
BUN: 24 mg/dL — ABNORMAL HIGH (ref 6–20)
CALCIUM: 9.2 mg/dL (ref 8.9–10.3)
CO2: 27 mmol/L (ref 22–32)
Chloride: 103 mmol/L (ref 101–111)
Creatinine, Ser: 1.01 mg/dL — ABNORMAL HIGH (ref 0.44–1.00)
GFR calc Af Amer: >60 mL/min (ref >60)
GFR calc non Af Amer: 55 mL/min — ABNORMAL LOW (ref >60)
Glucose, Bld: 102 mg/dL — ABNORMAL HIGH (ref 65–99)
Potassium: 4.8 mmol/L (ref 3.5–5.1)
SODIUM: 139 mmol/L (ref 135–145)
Total Bilirubin: 1.8 mg/dL — ABNORMAL HIGH (ref 0.3–1.2)
Total Protein: 7.4 g/dL (ref 6.5–8.1)

## 2014-12-16 LAB — URINALYSIS, ROUTINE W REFLEX MICROSCOPIC
BILIRUBIN URINE: NEGATIVE
Glucose, UA: NEGATIVE mg/dL
Hgb urine dipstick: NEGATIVE
Ketones, ur: NEGATIVE mg/dL
Leukocytes, UA: NEGATIVE
Nitrite: NEGATIVE
Protein, ur: NEGATIVE mg/dL
SPECIFIC GRAVITY, URINE: 1.01 (ref 1.005–1.030)
UROBILINOGEN UA: 0.2 mg/dL (ref 0.0–1.0)
pH: 6.5 (ref 5.0–8.0)

## 2014-12-16 LAB — LIPASE, BLOOD: Lipase: 14 U/L — ABNORMAL LOW (ref 22–51)

## 2014-12-16 LAB — TROPONIN I: Troponin I: <0.03 ng/mL (ref <0.031)

## 2014-12-16 LAB — CBG MONITORING, ED: Glucose-Capillary: 135 mg/dL — ABNORMAL HIGH (ref 65–99)

## 2014-12-16 MED ORDER — SODIUM CHLORIDE 0.9 % IV BOLUS (SEPSIS)
500.0000 mL | Freq: Once | INTRAVENOUS | Status: AC
  Administered 2014-12-16: 500 mL via INTRAVENOUS

## 2014-12-16 NOTE — ED Notes (Signed)
Per EMS pt coming from home with c/o generalized weakness and fall this morning, pt was recently treated for UTI, daughter sts pt has same symptoms again. Pt reports left sided weakness due to MS.

## 2014-12-16 NOTE — ED Notes (Signed)
Bed: WHALD Expected date:  Expected time:  Means of arrival:  Comments: No bed  

## 2014-12-16 NOTE — ED Notes (Signed)
Patient called her daughter and aunt? To see if there is someone can come get her.

## 2014-12-16 NOTE — ED Notes (Signed)
Daughter here to pick up pt.   

## 2014-12-16 NOTE — ED Notes (Signed)
Called pt's daughter to let her know pt is ready for discharge, no answer. Left voice mail with call back information.

## 2014-12-16 NOTE — ED Notes (Signed)
Bed: WA06 Expected date:  Expected time:  Means of arrival:  Comments: Ems-

## 2014-12-16 NOTE — ED Notes (Signed)
Bed: WHALE Expected date:  Expected time:  Means of arrival:  Comments: 

## 2014-12-16 NOTE — ED Notes (Signed)
Pt became really weak while being transferred to the car from the wheelchair.  Pt unable to stand on her own d/t weakness.  Pt is awake, daughter at bedside.  Adriana Simas EDP made aware and will re-evaluate pt.

## 2014-12-16 NOTE — ED Provider Notes (Addendum)
CSN: [id number]     Arrival date & time 12/16/14  1437 History   First MD Initiated Contact with Patient 12/16/14 1506     Chief Complaint  Patient presents with  . Fall  . Weakness     (Consider location/radiation/quality/duration/timing/severity/associated sxs/prior Treatment) HPI.... Accidental fall today. No prodromal illnesses. Patient has multiple health problems well documented in past medical history. Vague chest pain initially: no  dyspnea, fever, chills, dysuria. She tends to fall a lot. Severity is mild. Nothing makes symptoms better or worse. No head or neck trauma. Patient was to be discharged when she became very weak while attempting to get from the wheelchair to the car. Daughter says she cannot take care of herself or walk appropriately. She is unable to care for her home.  Past Medical History  Diagnosis Date  . HTN (hypertension)   . Ascending aortic aneurysm   . Dyslipidemia   . Tobacco dependence   . Hypothyroidism   . Depression   . Neurogenic bladder   . Obesity   . Dog bite(E906.0)     RLL with systemic inflammatory response  . Chronic low back pain   . Anxiety   . MS (multiple sclerosis)     Followed by Dr. Sandria Manly  . High cholesterol   . Aortic aneurysm   . Complication of anesthesia   . PONV (postoperative nausea and vomiting)   . Aortic aneurysm   . Numbness     "different spots"  . Arthritis   . History of ulcer disease 30 yrs ago  . IBS (irritable bowel syndrome)   . Incontinence in female   . Obstructive sleep apnea     not currently using c pap   Past Surgical History  Procedure Laterality Date  . Cholecystectomy    . Hysterectomy    . Nasal sinus surgery    . Left eye surgery    . Bilateral foot surgery    . Leg surgery  2006    due to fx leg   . Back surgery    . Total knee arthroplasty Left 04/11/2014    Procedure: LEFT TOTAL KNEE ARTHROPLASTY;  Surgeon: Shelda Pal, MD;  Location: [facility];  Service: Orthopedics;  Laterality:  Left;   No family history on file. History  Substance Use Topics  . Smoking status: Current Every Day Smoker -- 0.50 packs/day for 30 years    Types: Cigarettes  . Smokeless tobacco: Never Used  . Alcohol Use: Yes     Comment: Rare alcohol use   OB History    No data available     Review of Systems  All other systems reviewed and are negative.     Allergies  Codeine; Lisinopril; Demerol; and Penicillins  Home Medications   Prior to Admission medications   Medication Sig Start Date End Date Taking? Authorizing Provider  amLODipine (NORVASC) 5 MG tablet Take 5 mg by mouth daily.   Yes Historical Provider, MD  atorvastatin (LIPITOR) 20 MG tablet Take 20 mg by mouth daily.   Yes Historical Provider, MD  baclofen (LIORESAL) 10 MG tablet Take 10 mg by mouth 2 (two) times daily as needed for muscle spasms.   Yes Historical Provider, MD  buPROPion (WELLBUTRIN XL) 300 MG 24 hr tablet Take 1 tablet by mouth daily.  01/17/14  Yes Historical Provider, MD  Cholecalciferol (VITAMIN D) 2000 UNITS tablet Take 4,000 Units by mouth 2 (two) times daily.    Yes Historical Provider, MD  clonazePAM (KLONOPIN) 1 MG tablet Take 1 mg by mouth 3 (three) times daily.   Yes Historical Provider, MD  Cyanocobalamin (VITAMIN B 12 PO) Take 20,000-25,000 mcg by mouth every morning.   Yes Historical Provider, MD  diclofenac sodium (VOLTAREN) 1 % GEL Apply 2 g topically every 8 (eight) hours as needed (pain).   Yes Historical Provider, MD  diphenoxylate-atropine (LOMOTIL) 2.5-0.025 MG per tablet Take 2 tablets by mouth 4 (four) times daily as needed for diarrhea or loose stools.    Yes Historical Provider, MD  FLUoxetine (PROZAC) 40 MG capsule Take 40 mg by mouth every morning.    Yes Historical Provider, MD  gabapentin (NEURONTIN) 100 MG capsule Take 100-200 mg by mouth every 8 (eight) hours.    Yes Historical Provider, MD  levothyroxine (SYNTHROID, LEVOTHROID) 200 MCG tablet Take 200 mcg by mouth daily before  breakfast.   Yes Historical Provider, MD  losartan (COZAAR) 100 MG tablet Take 1 tablet by mouth every morning.  10/15/11  Yes Historical Provider, MD  omeprazole (PRILOSEC) 20 MG capsule Take 20 mg by mouth daily as needed (pain).    Yes Historical Provider, MD  pantoprazole (PROTONIX) 40 MG tablet Take 40 mg by mouth 2 (two) times daily.   Yes Historical Provider, MD  pravastatin (PRAVACHOL) 40 MG tablet Take 40 mg by mouth at bedtime.   Yes Historical Provider, MD  Umeclidinium-Vilanterol (ANORO ELLIPTA) 62.5-25 MCG/INH AEPB Inhale into the lungs daily.   Yes Historical Provider, MD  levofloxacin (LEVAQUIN) 500 MG tablet Take 1 tablet (500 mg total) by mouth daily. Patient not taking: Reported on 11/27/2014 05/21/14   Catarina Hartshorn, MD   BP 147/93 mmHg  Pulse 70  Temp(Src) 98.3 F (36.8 C) (Oral)  Resp 16  SpO2 95% Physical Exam  Constitutional:  Obese;  sleepy but easily arousable  HENT:  Head: Normocephalic and atraumatic.  Eyes: Conjunctivae and EOM are normal. Pupils are equal, round, and reactive to light.  Neck: Normal range of motion. Neck supple.  Cardiovascular: Normal rate and regular rhythm.   Pulmonary/Chest: Effort normal and breath sounds normal.  Abdominal: Soft. Bowel sounds are normal.  Musculoskeletal:  No bony tenderness  Neurological:  Moves all extremities sluggishly  Skin: Skin is warm and dry.  Psychiatric:  Flat affect  Nursing note and vitals reviewed.   ED Course  Procedures (including critical care time) Labs Review Labs Reviewed  COMPREHENSIVE METABOLIC PANEL - Abnormal; Notable for the following:    Glucose, Bld 102 (*)    BUN 24 (*)    Creatinine, Ser 1.01 (*)    AST 123 (*)    ALT 81 (*)    Total Bilirubin 1.8 (*)    GFR calc non Af Amer 55 (*)    All other components within normal limits  LIPASE, BLOOD - Abnormal; Notable for the following:    Lipase 14 (*)    All other components within normal limits  CBC WITH DIFFERENTIAL/PLATELET -  Abnormal; Notable for the following:    WBC 13.9 (*)    RBC 5.16 (*)    Hemoglobin 15.1 (*)    HCT 46.3 (*)    Neutro Abs 9.8 (*)    Monocytes Absolute 1.3 (*)    All other components within normal limits  CBG MONITORING, ED - Abnormal; Notable for the following:    Glucose-Capillary 135 (*)    All other components within normal limits  TROPONIN I  URINALYSIS, ROUTINE W REFLEX MICROSCOPIC (NOT AT  [facility])  CBC WITH DIFFERENTIAL/PLATELET    Imaging Review Ct Head Wo Contrast  12/16/2014   CLINICAL DATA:  Status post fall, generalized weakness  EXAM: CT HEAD WITHOUT CONTRAST  TECHNIQUE: Contiguous axial images were obtained from the base of the skull through the vertex without intravenous contrast.  COMPARISON:  05/17/2014  FINDINGS: No evidence of parenchymal hemorrhage or extra-axial fluid collection. No mass lesion, mass effect, or midline shift.  No CT evidence of acute infarction.  Subcortical white matter and periventricular small vessel ischemic changes. Mild intracranial atherosclerosis.  Cerebral volume is within normal limits.  No ventriculomegaly.  The visualized paranasal sinuses are essentially clear. The mastoid air cells are unopacified.  No evidence of calvarial fracture.  IMPRESSION: No evidence of acute intracranial abnormality.  Small vessel ischemic changes.   Electronically Signed   By: Charline Bills M.D.   On: 12/16/2014 23:15     EKG Interpretation   Date/Time:  Friday December 16 2014 15:48:22 EDT Ventricular Rate:  68 PR Interval:  161 QRS Duration: 146 QT Interval:  451 QTC Calculation: 480 R Axis:   -41 Text Interpretation:  Sinus rhythm Atrial premature complex Right bundle  branch block Inferior infarct, old Baseline wander in lead(s) I III aVL V1  V2 V3 V4 V5 Confirmed by Aydn Ferrara  MD, Roran Wegner ([id number]) on 12/16/2014 4:09:25 PM      MDM   Final diagnoses:  Fall, initial encounter  Syncope, unspecified syncope type  Chest pain, unspecified chest pain type     Patient was initially discharged. She was unable to transfer from the wheelchair to the car and had a syncopal spell in the parking lot. She is unable to walk independently. Will admit. Uncertain etiology of her symptoms    Donnetta Hutching, MD 12/16/14 [id number]  Donnetta Hutching, MD 12/17/14 [phone]

## 2014-12-16 NOTE — ED Notes (Signed)
Bed: WA15 Expected date:  Expected time:  Means of arrival:  Comments: 

## 2014-12-17 ENCOUNTER — Observation Stay (HOSPITAL_COMMUNITY): Payer: Medicare Other

## 2014-12-17 ENCOUNTER — Encounter (HOSPITAL_COMMUNITY): Payer: Self-pay | Admitting: Oncology

## 2014-12-17 DIAGNOSIS — W19XXXA Unspecified fall, initial encounter: Secondary | ICD-10-CM | POA: Insufficient documentation

## 2014-12-17 DIAGNOSIS — R55 Syncope and collapse: Secondary | ICD-10-CM | POA: Diagnosis not present

## 2014-12-17 DIAGNOSIS — I10 Essential (primary) hypertension: Secondary | ICD-10-CM | POA: Diagnosis not present

## 2014-12-17 DIAGNOSIS — R079 Chest pain, unspecified: Secondary | ICD-10-CM | POA: Diagnosis not present

## 2014-12-17 DIAGNOSIS — M25522 Pain in left elbow: Secondary | ICD-10-CM

## 2014-12-17 DIAGNOSIS — M25529 Pain in unspecified elbow: Secondary | ICD-10-CM | POA: Insufficient documentation

## 2014-12-17 LAB — CBC
HEMATOCRIT: 44.1 % (ref 36.0–46.0)
Hemoglobin: 14.8 g/dL (ref 12.0–15.0)
MCH: 30.3 pg (ref 26.0–34.0)
MCHC: 33.6 g/dL (ref 30.0–36.0)
MCV: 90.2 fL (ref 78.0–100.0)
PLATELETS: 313 10*3/uL (ref 150–400)
RBC: 4.89 MIL/uL (ref 3.87–5.11)
RDW: 14.5 % (ref 11.5–15.5)
WBC: 11.1 10*3/uL — AB (ref 4.0–10.5)

## 2014-12-17 LAB — BASIC METABOLIC PANEL
Anion gap: 9 (ref 5–15)
BUN: 21 mg/dL — ABNORMAL HIGH (ref 6–20)
CALCIUM: 9.1 mg/dL (ref 8.9–10.3)
CHLORIDE: 105 mmol/L (ref 101–111)
CO2: 26 mmol/L (ref 22–32)
CREATININE: 0.91 mg/dL (ref 0.44–1.00)
GFR calc Af Amer: >60 mL/min (ref >60)
GLUCOSE: 121 mg/dL — AB (ref 65–99)
Potassium: 3.9 mmol/L (ref 3.5–5.1)
SODIUM: 140 mmol/L (ref 135–145)

## 2014-12-17 LAB — TROPONIN I
Troponin I: <0.03 ng/mL (ref <0.031)
Troponin I: <0.03 ng/mL (ref <0.031)

## 2014-12-17 LAB — TSH: TSH: 0.581 u[IU]/mL (ref 0.350–4.500)

## 2014-12-17 MED ORDER — PANTOPRAZOLE SODIUM 40 MG PO TBEC
40.0000 mg | DELAYED_RELEASE_TABLET | Freq: Every day | ORAL | Status: DC
  Administered 2014-12-17 – 2014-12-22 (×5): 40 mg via ORAL
  Filled 2014-12-17 (×6): qty 1

## 2014-12-17 MED ORDER — GABAPENTIN 100 MG PO CAPS
100.0000 mg | ORAL_CAPSULE | Freq: Three times a day (TID) | ORAL | Status: DC
  Administered 2014-12-17 – 2014-12-22 (×16): 100 mg via ORAL
  Filled 2014-12-17 (×18): qty 1

## 2014-12-17 MED ORDER — ONDANSETRON HCL 4 MG PO TABS: 4.0000 mg | ORAL_TABLET | Freq: Four times a day (QID) | ORAL | Status: DC | PRN

## 2014-12-17 MED ORDER — NICOTINE 7 MG/24HR TD PT24
7.0000 mg | MEDICATED_PATCH | Freq: Every day | TRANSDERMAL | Status: DC
  Administered 2014-12-17 – 2014-12-22 (×6): 7 mg via TRANSDERMAL
  Filled 2014-12-17 (×7): qty 1

## 2014-12-17 MED ORDER — SODIUM CHLORIDE 0.9 % IV SOLN
INTRAVENOUS | Status: DC
  Administered 2014-12-17 – 2014-12-18 (×2): via INTRAVENOUS

## 2014-12-17 MED ORDER — ACETAMINOPHEN 325 MG PO TABS: 650.0000 mg | ORAL_TABLET | Freq: Four times a day (QID) | ORAL | Status: DC | PRN

## 2014-12-17 MED ORDER — ASPIRIN EC 325 MG PO TBEC
325.0000 mg | DELAYED_RELEASE_TABLET | Freq: Every day | ORAL | Status: DC
  Administered 2014-12-17 – 2014-12-21 (×4): 325 mg via ORAL
  Filled 2014-12-17 (×5): qty 1

## 2014-12-17 MED ORDER — ONDANSETRON HCL 4 MG/2ML IJ SOLN: 4.0000 mg | Freq: Four times a day (QID) | INTRAMUSCULAR | Status: DC | PRN

## 2014-12-17 MED ORDER — CLONAZEPAM 1 MG PO TABS
1.0000 mg | ORAL_TABLET | Freq: Three times a day (TID) | ORAL | Status: DC
  Administered 2014-12-17 (×2): 1 mg via ORAL
  Filled 2014-12-17 (×2): qty 1

## 2014-12-17 MED ORDER — LEVOFLOXACIN 500 MG PO TABS
500.0000 mg | ORAL_TABLET | Freq: Every day | ORAL | Status: DC
  Administered 2014-12-17 – 2014-12-19 (×2): 500 mg via ORAL
  Filled 2014-12-17 (×4): qty 1

## 2014-12-17 MED ORDER — ENOXAPARIN SODIUM 40 MG/0.4ML ~~LOC~~ SOLN
40.0000 mg | SUBCUTANEOUS | Status: DC
  Administered 2014-12-17 – 2014-12-22 (×6): 40 mg via SUBCUTANEOUS
  Filled 2014-12-17 (×6): qty 0.4

## 2014-12-17 MED ORDER — SODIUM CHLORIDE 0.9 % IJ SOLN
3.0000 mL | Freq: Two times a day (BID) | INTRAMUSCULAR | Status: DC
  Administered 2014-12-20 – 2014-12-22 (×4): 3 mL via INTRAVENOUS

## 2014-12-17 MED ORDER — ACETAMINOPHEN 650 MG RE SUPP: 650.0000 mg | Freq: Four times a day (QID) | RECTAL | Status: DC | PRN

## 2014-12-17 MED ORDER — LEVOTHYROXINE SODIUM 100 MCG PO TABS
200.0000 ug | ORAL_TABLET | Freq: Every day | ORAL | Status: DC
  Administered 2014-12-17 – 2014-12-22 (×4): 200 ug via ORAL
  Filled 2014-12-17 (×2): qty 2
  Filled 2014-12-17 (×2): qty 1
  Filled 2014-12-17: qty 2

## 2014-12-17 MED ORDER — NITROGLYCERIN 2 % TD OINT
0.5000 [in_us] | TOPICAL_OINTMENT | Freq: Four times a day (QID) | TRANSDERMAL | Status: DC
  Administered 2014-12-17 – 2014-12-22 (×22): 0.5 [in_us] via TOPICAL
  Filled 2014-12-17 (×2): qty 30

## 2014-12-17 MED ORDER — AMLODIPINE BESYLATE 5 MG PO TABS
5.0000 mg | ORAL_TABLET | Freq: Every day | ORAL | Status: DC
  Administered 2014-12-17: 5 mg via ORAL
  Filled 2014-12-17 (×2): qty 1

## 2014-12-17 MED ORDER — VITAMIN D 1000 UNITS PO TABS
4000.0000 [IU] | ORAL_TABLET | Freq: Two times a day (BID) | ORAL | Status: DC
  Administered 2014-12-17 – 2014-12-22 (×10): 4000 [IU] via ORAL
  Filled 2014-12-17 (×12): qty 4

## 2014-12-17 MED ORDER — BUPROPION HCL ER (XL) 150 MG PO TB24
300.0000 mg | ORAL_TABLET | Freq: Every day | ORAL | Status: DC
  Administered 2014-12-17 – 2014-12-22 (×5): 300 mg via ORAL
  Filled 2014-12-17: qty 2
  Filled 2014-12-17 (×2): qty 1
  Filled 2014-12-17 (×3): qty 2

## 2014-12-17 MED ORDER — ATORVASTATIN CALCIUM 10 MG PO TABS
20.0000 mg | ORAL_TABLET | Freq: Every day | ORAL | Status: DC
  Administered 2014-12-17 – 2014-12-21 (×5): 20 mg via ORAL
  Filled 2014-12-17 (×2): qty 2
  Filled 2014-12-17 (×2): qty 1
  Filled 2014-12-17 (×2): qty 2

## 2014-12-17 MED ORDER — SODIUM CHLORIDE 0.9 % IJ SOLN: 3.0000 mL | INTRAMUSCULAR | Status: DC | PRN

## 2014-12-17 MED ORDER — OXYCODONE HCL 5 MG PO TABS: 5.0000 mg | ORAL_TABLET | ORAL | Status: DC | PRN

## 2014-12-17 MED ORDER — SODIUM CHLORIDE 0.9 % IV SOLN: INTRAVENOUS | Status: DC

## 2014-12-17 MED ORDER — SODIUM CHLORIDE 0.9 % IJ SOLN
3.0000 mL | Freq: Two times a day (BID) | INTRAMUSCULAR | Status: DC
  Administered 2014-12-17 – 2014-12-21 (×10): 3 mL via INTRAVENOUS

## 2014-12-17 MED ORDER — ALUM & MAG HYDROXIDE-SIMETH 200-200-20 MG/5ML PO SUSP: 30.0000 mL | Freq: Four times a day (QID) | ORAL | Status: DC | PRN

## 2014-12-17 MED ORDER — HYDROMORPHONE HCL 1 MG/ML IJ SOLN
0.5000 mg | INTRAMUSCULAR | Status: DC | PRN
  Administered 2014-12-18 – 2014-12-20 (×2): 1 mg via INTRAVENOUS
  Filled 2014-12-17 (×2): qty 1

## 2014-12-17 MED ORDER — SODIUM CHLORIDE 0.9 % IV SOLN: 250.0000 mL | INTRAVENOUS | Status: DC | PRN

## 2014-12-17 MED ORDER — FLUOXETINE HCL 20 MG PO CAPS
40.0000 mg | ORAL_CAPSULE | Freq: Every day | ORAL | Status: DC
  Administered 2014-12-17 – 2014-12-22 (×5): 40 mg via ORAL
  Filled 2014-12-17 (×7): qty 2

## 2014-12-17 MED ORDER — LOSARTAN POTASSIUM 50 MG PO TABS
100.0000 mg | ORAL_TABLET | Freq: Every day | ORAL | Status: DC
  Administered 2014-12-17 – 2014-12-22 (×5): 100 mg via ORAL
  Filled 2014-12-17 (×7): qty 2

## 2014-12-17 NOTE — Evaluation (Signed)
Physical Therapy Evaluation Patient Details Name: [name] [name] MRN: [id number] DOB: 09/12/1940 Today's Date: 12/17/2014   History of Present Illness  [name] [name] is a 74 y.o. female with a history of HTN, Aortic Aneurysm, Dyslipidemia, and Hypothyroid who presented to the ED 12/16/14  with complaints of sharp intermittent Left sided chest pain rated at a 5/10 associated with SOB Nausea and No Vomiting or Diaphoresis today. She also had a fell today Without injury. She was seen in the ED earlier and discharged but returned after suffering a syncopal episode and was returned to ED and  admitted for observation.   Clinical Impression  Patient sonorous, coughing up sputum, not taking care of own saliva at times, yawning constantly, poorly arousable, noted not moving LUE, unable to grip on RW, noted to move/use RUE volitionally, unable to safely stand at bedside. Patient will benefit from PT to address problems listed in note below.    Follow Up Recommendations SNF;Supervision/Assistance - 24 hour    Equipment Recommendations  None recommended by PT    Recommendations for Other Services       Precautions / Restrictions Precautions Precautions: Fall Precaution Comments: monitor BP      Mobility  Bed Mobility Overal bed mobility: Needs Assistance;+2 for physical assistance;+ 2 for safety/equipment Bed Mobility: Rolling;Sidelying to Sit;Sit to Sidelying Rolling: Total assist;+2 for physical assistance;+2 for safety/equipment Sidelying to sit: Total assist;+2 for physical assistance;+2 for safety/equipment     Sit to sidelying: Total assist;+2 for physical assistance;+2 for safety/equipment General bed mobility comments: patient did not assist for any mobility without maximum multimodal cues to roll. no control to retrun to sidelying,  did not place L arm to support when lying onto side.   Transfers Overall transfer level: Needs assistance Equipment used: Rolling walker (2  wheeled) Transfers: Sit to/from Stand           General transfer comment: unable to stand  from bed, unable to  keep L hand on RW, flops off.   Ambulation/Gait                Stairs            Wheelchair Mobility    Modified Rankin (Stroke Patients Only)       Balance Overall balance assessment: Needs assistance Sitting-balance support: Feet supported;Single extremity supported Sitting balance-Leahy Scale: Poor Sitting balance - Comments: initially not balance responses, gradually did support self near midline without external support. Postural control: Left lateral lean                                   Pertinent Vitals/Pain Pain Assessment: No/denies pain    Home Living Family/patient expects to be discharged to:: Private residence Living Arrangements: Children               Additional Comments: no family available and patient unable to provide    Prior Function                 Hand Dominance        Extremity/Trunk Assessment   Upper Extremity Assessment: LUE deficits/detail;RUE deficits/detail RUE Deficits / Details: noted able to functionally use UE,     LUE Deficits / Details: noted to be limp, unable to hold  up when placed, unable to hold/grip onto RW.   Lower Extremity Assessment: LLE deficits/detail;RLE deficits/detail RLE Deficits / Details: moves leg spontaneous much more than L. LLE  Deficits / Details: note to flex hip and knee  in supine through partial range, able to place and hold in hooklying position, unable  to beaR WEIGHT IN STANDING.     Communication      Cognition Arousal/Alertness: Lethargic Behavior During Therapy: Restless;Impulsive Overall Cognitive Status: No family/caregiver present to determine baseline cognitive functioning Area of Impairment: Following commands               General Comments: patient keeps eyes  closed, poorly responsive to questions and arousal. patient was  noted to have had medications that could affect arousal.    General Comments      Exercises        Assessment/Plan    PT Assessment Patient needs continued PT services  PT Diagnosis Difficulty walking;Altered mental status;Generalized weakness   PT Problem List Decreased strength;Decreased activity tolerance;Decreased balance;Decreased mobility;Decreased coordination;Decreased cognition;Impaired tone;Decreased knowledge of precautions;Decreased safety awareness;Decreased knowledge of use of DME  PT Treatment Interventions DME instruction;Gait training;Functional mobility training;Therapeutic activities;Therapeutic exercise;Balance training;Patient/family education   PT Goals (Current goals can be found in the Care Plan section) Acute Rehab PT Goals Patient Stated Goal: pt unable PT Goal Formulation: Patient unable to participate in goal setting Time For Goal Achievement: 12/31/14 Potential to Achieve Goals: Fair    Frequency Min 3X/week   Barriers to discharge Decreased caregiver support      Co-evaluation               End of Session Equipment Utilized During Treatment: Gait belt Activity Tolerance: Patient limited by lethargy Patient left: in bed;with call bell/phone within reach;with bed alarm set Nurse Communication: Mobility status    Functional Assessment Tool Used: clinical judgement Functional Limitation: Mobility: Walking and moving around Mobility: Walking and Moving Around Current Status (Z6109): 100 percent impaired, limited or restricted Mobility: Walking and Moving Around Goal Status [id number]): At least 20 percent but less than 40 percent impaired, limited or restricted    Time: 1153-1216 PT Time Calculation (min) (ACUTE ONLY): 23 min   Charges:   PT Evaluation $Initial PT Evaluation Tier I: 1 Procedure PT Treatments $Therapeutic Activity: 8-22 mins   PT G Codes:   PT G-Codes **NOT FOR INPATIENT CLASS** Functional Assessment Tool Used: clinical  judgement Functional Limitation: Mobility: Walking and moving around Mobility: Walking and Moving Around Current Status (U9811): 100 percent impaired, limited or restricted Mobility: Walking and Moving Around Goal Status (B1478): At least 20 percent but less than 40 percent impaired, limited or restricted    Rada Hay 12/17/2014, 2:39 PM Blanchard Kelch PT [phone]

## 2014-12-17 NOTE — Progress Notes (Signed)
73 year old lady with h/o hypertension, hypothyroidism, admitted for a fall, a questionable syncope and she reports pain in the left elbow. Septic work up so far has been negative. CXR negative for infection and UA is negative for infection.  Initial CT head was negative. Ordered MRI brain . Ordered X RAY OF THE left elbow, .  On exam, Patient is drowsy but answering questions appropriately.  CVS: s1s2 Lungs : clear no wheezing or rhonchi Abdomen: Soft non tender non distended bowel sounds heard.  Extremities: Trace pedal edema Neuro: She is arousable, answering questions appropriately,. Able to move all extremities except for left forearm, reports tenderness .   Plan: Order levaquin for bronchitis and PT eval.   Syrina Wake, MD [phone] 

## 2014-12-17 NOTE — H&P (Signed)
[facility] Admission History and Physical       [name] [name] ZOX:[id number] DOB: 1940-08-18 DOA: 12/16/2014  Referring physician: EDP PCP: Ezequiel Kayser, MD  Specialists:   Chief Complaint: Chest Pain   HPI: [name] [name] is a 74 y.o. female with a history of HTN, Aortic Aneurysm, Dyslipidemia, and Hypothyroid who presented to the ED with complaints of sharp intermittent Left sided chest pain rated at a 5/10 associated with SOB Nausea and No Vomiting or Diaphoresis today.    She also had a fell today  Without injury.   She was seen in the ED earlier and discharged but returned after suffering a syncopal episode and was referred for medical admission.     Review of Systems:  Constitutional: No Weight Loss, No Weight Gain, Night Sweats, Fevers, Chills, Dizziness, Light Headedness, Fatigue, or Generalized Weakness HEENT: No Headaches, Difficulty Swallowing,Tooth/Dental Problems,Sore Throat,  No Sneezing, Rhinitis, Ear Ache, Nasal Congestion, or Post Nasal Drip,  Cardio-vascular:  +Chest pain, Orthopnea, PND, Edema in Lower Extremities, Anasarca, Dizziness, Palpitations  Resp: +Dyspnea, No DOE, No Productive Cough, No Non-Productive Cough, No Hemoptysis, No Wheezing.    GI: No Heartburn, Indigestion, Abdominal Pain, +Nausea, Vomiting, Diarrhea, Constipation, Hematemesis, Hematochezia, Melena, Change in Bowel Habits,  Loss of Appetite  GU: No Dysuria, No Change in Color of Urine, No Urgency or Urinary Frequency, No Flank pain.  Musculoskeletal: No Joint Pain or Swelling, No Decreased Range of Motion, No Back Pain.  Neurologic: +Syncope, No Seizures, Muscle Weakness, Paresthesia, Vision Disturbance or Loss, No Diplopia, No Vertigo, No Difficulty Walking,  Skin: No Rash or Lesions. Psych: No Change in Mood or Affect, No Depression or Anxiety, No Memory loss, No Confusion, or Hallucinations   Past Medical History  Diagnosis Date  . HTN (hypertension)   . Ascending aortic aneurysm     . Dyslipidemia   . Tobacco dependence   . Hypothyroidism   . Depression   . Neurogenic bladder   . Obesity   . Dog bite(E906.0)     RLL with systemic inflammatory response  . Chronic low back pain   . Anxiety   . MS (multiple sclerosis)     Followed by Dr. Sandria Manly  . High cholesterol   . Aortic aneurysm   . Complication of anesthesia   . PONV (postoperative nausea and vomiting)   . Aortic aneurysm   . Numbness     "different spots"  . Arthritis   . History of ulcer disease 30 yrs ago  . IBS (irritable bowel syndrome)   . Incontinence in female   . Obstructive sleep apnea     not currently using c pap     Past Surgical History  Procedure Laterality Date  . Cholecystectomy    . Hysterectomy    . Nasal sinus surgery    . Left eye surgery    . Bilateral foot surgery    . Leg surgery  2006    due to fx leg   . Back surgery    . Total knee arthroplasty Left 04/11/2014    Procedure: LEFT TOTAL KNEE ARTHROPLASTY;  Surgeon: Shelda Pal, MD;  Location: [facility];  Service: Orthopedics;  Laterality: Left;      Prior to Admission medications   Medication Sig Start Date End Date Taking? Authorizing Provider  amLODipine (NORVASC) 5 MG tablet Take 5 mg by mouth daily.   Yes Historical Provider, MD  atorvastatin (LIPITOR) 20 MG tablet Take 20 mg by mouth daily.  Yes Historical Provider, MD  baclofen (LIORESAL) 10 MG tablet Take 10 mg by mouth 2 (two) times daily as needed for muscle spasms.   Yes Historical Provider, MD  buPROPion (WELLBUTRIN XL) 300 MG 24 hr tablet Take 1 tablet by mouth daily.  01/17/14  Yes Historical Provider, MD  Cholecalciferol (VITAMIN D) 2000 UNITS tablet Take 4,000 Units by mouth 2 (two) times daily.    Yes Historical Provider, MD  clonazePAM (KLONOPIN) 1 MG tablet Take 1 mg by mouth 3 (three) times daily.   Yes Historical Provider, MD  Cyanocobalamin (VITAMIN B 12 PO) Take 20,000-25,000 mcg by mouth every morning.   Yes Historical Provider, MD  diclofenac  sodium (VOLTAREN) 1 % GEL Apply 2 g topically every 8 (eight) hours as needed (pain).   Yes Historical Provider, MD  diphenoxylate-atropine (LOMOTIL) 2.5-0.025 MG per tablet Take 2 tablets by mouth 4 (four) times daily as needed for diarrhea or loose stools.    Yes Historical Provider, MD  FLUoxetine (PROZAC) 40 MG capsule Take 40 mg by mouth every morning.    Yes Historical Provider, MD  gabapentin (NEURONTIN) 100 MG capsule Take 100-200 mg by mouth every 8 (eight) hours.    Yes Historical Provider, MD  levothyroxine (SYNTHROID, LEVOTHROID) 200 MCG tablet Take 200 mcg by mouth daily before breakfast.   Yes Historical Provider, MD  losartan (COZAAR) 100 MG tablet Take 1 tablet by mouth every morning.  10/15/11  Yes Historical Provider, MD  omeprazole (PRILOSEC) 20 MG capsule Take 20 mg by mouth daily as needed (pain).    Yes Historical Provider, MD  pantoprazole (PROTONIX) 40 MG tablet Take 40 mg by mouth 2 (two) times daily.   Yes Historical Provider, MD  pravastatin (PRAVACHOL) 40 MG tablet Take 40 mg by mouth at bedtime.   Yes Historical Provider, MD  Umeclidinium-Vilanterol (ANORO ELLIPTA) 62.5-25 MCG/INH AEPB Inhale into the lungs daily.   Yes Historical Provider, MD  levofloxacin (LEVAQUIN) 500 MG tablet Take 1 tablet (500 mg total) by mouth daily. Patient not taking: Reported on 11/27/2014 05/21/14   Catarina Hartshorn, MD     Allergies  Allergen Reactions  . Codeine Other (See Comments)    Real bad chest pains  . Lisinopril Cough  . Demerol Nausea Only  . Penicillins Rash and Other (See Comments)    Pt not sure "chestpains"    Social History:  reports that she has been smoking Cigarettes.  She has a 15 pack-year smoking history. She has never used smokeless tobacco. She reports that she drinks alcohol. She reports that she does not use illicit drugs.     Family History:     CAD in Mother   Physical Exam:  GEN:  Pleasant Obese  74 y.o. Caucasian female examined and in no acute distress;  cooperative with exam Filed Vitals:   12/16/14 1446 12/16/14 1845 12/16/14 2109 12/16/14 2241  BP: 123/59 141/78 125/80 147/93  Pulse: 69 78 74 70  Temp: 98.1 F (36.7 C)  98.3 F (36.8 C)   TempSrc: Oral  Oral   Resp: 18 18 20 16   SpO2: 93% 95% 96% 95%   Blood pressure 147/93, pulse 70, temperature 98.3 F (36.8 C), temperature source Oral, resp. rate 16, SpO2 95 %. PSYCH: She is alert and oriented x4; does not appear anxious does not appear depressed; affect is normal HEENT: Normocephalic and Atraumatic, Mucous membranes pink; PERRLA; EOM intact; Fundi:  Benign;  No scleral icterus, Nares: Patent, Oropharynx: Clear, Fair Dentition,  Neck:  FROM, No Cervical Lymphadenopathy nor Thyromegaly or Carotid Bruit; No JVD; Breasts:: Not examined CHEST WALL: No tenderness CHEST: Normal respiration, clear to auscultation bilaterally HEART: Regular rate and rhythm; no murmurs rubs or gallops BACK: No kyphosis or scoliosis; No CVA tenderness ABDOMEN: Positive Bowel Sounds, Obese, Soft Non-Tender, No Rebound or Guarding; No Masses, No Organomegaly, No Pannus; No Intertriginous candida. Rectal Exam: Not done EXTREMITIES: No Cyanosis, Clubbing, or Edema; No Ulcerations. Genitalia: not examined PULSES: 2+ and symmetric SKIN: Normal hydration no rash or ulceration CNS:  Alert and Oriented x 4, No Focal Deficits  Vascular: pulses palpable throughout    Labs on Admission:  Basic Metabolic Panel:  Recent Labs Lab 12/16/14 1546  NA 139  K 4.8  CL 103  CO2 27  GLUCOSE 102*  BUN 24*  CREATININE 1.01*  CALCIUM 9.2   Liver Function Tests:  Recent Labs Lab 12/16/14 1546  AST 123*  ALT 81*  ALKPHOS 71  BILITOT 1.8*  PROT 7.4  ALBUMIN 3.9    Recent Labs Lab 12/16/14 1546  LIPASE 14*   No results for input(s): AMMONIA in the last 168 hours. CBC:  Recent Labs Lab 12/16/14 1622  WBC 13.9*  NEUTROABS 9.8*  HGB 15.1*  HCT 46.3*  MCV 89.7  PLT 322   Cardiac  Enzymes:  Recent Labs Lab 12/16/14 1546  TROPONINI <0.03    BNP (last 3 results)  Recent Labs  11/27/14 1827  BNP 38.8    ProBNP (last 3 results) No results for input(s): PROBNP in the last 8760 hours.  CBG:  Recent Labs Lab 12/16/14 2107  GLUCAP 135*    Radiological Exams on Admission: Ct Head Wo Contrast  12/16/2014   CLINICAL DATA:  Status post fall, generalized weakness  EXAM: CT HEAD WITHOUT CONTRAST  TECHNIQUE: Contiguous axial images were obtained from the base of the skull through the vertex without intravenous contrast.  COMPARISON:  05/17/2014  FINDINGS: No evidence of parenchymal hemorrhage or extra-axial fluid collection. No mass lesion, mass effect, or midline shift.  No CT evidence of acute infarction.  Subcortical white matter and periventricular small vessel ischemic changes. Mild intracranial atherosclerosis.  Cerebral volume is within normal limits.  No ventriculomegaly.  The visualized paranasal sinuses are essentially clear. The mastoid air cells are unopacified.  No evidence of calvarial fracture.  IMPRESSION: No evidence of acute intracranial abnormality.  Small vessel ischemic changes.   Electronically Signed   By: Charline Bills M.D.   On: 12/16/2014 23:15   Dg Chest Port 1 View  12/17/2014   CLINICAL DATA:  Fall, weakness  EXAM: PORTABLE CHEST - 1 VIEW  COMPARISON:  CT chest dated 11/27/2014  FINDINGS: Lungs are essentially clear.  No pleural effusion or pneumothorax.  Cardiomegaly.  IMPRESSION: No evidence of acute cardiopulmonary disease.   Electronically Signed   By: Charline Bills M.D.   On: 12/17/2014 00:12     EKG: Independently reviewed. Normal Sinus Rhythm rate =68, + RBBB,        Assessment/Plan:      74 y.o. female with  Principal Problem:   1.   Chest pain   Telemetry monitoring   Cycle Troponins   NTG, ASA, O2 ]   No B-Blocker due to HR in low 60s   Active Problems:   2.   Syncope   Telemetry Monitoring   Check  Orthostatics   Fall PREcautions     3.   HTN (hypertension)   Continue Amlodipine  and Losartan Rx   Monitor BPs     4.   Hypothyroidism   Check TSH   Continue Levothyroxine Rx     5.   Tobacco dependence   Nicotine Patch   Continue To decrease Smoking until quit    6.   DVT Prophylaxis   Lovenox    Code Status:     FULL CODE  Family Communication:    No Family Present    Disposition Plan:     Observation Status        Time spent:  66  Minutes      Ron Parker [facility] Pager [phone]   If 7AM -7PM Please Contact the Day Rounding Team MD for [facility]  If 7PM-7AM, Please Contact Night-Floor Coverage  [email or web address] Password [email or web address] 12/17/2014, 12:40 AM     ADDENDUM:   Patient was seen and examined on 12/17/2014

## 2014-12-18 ENCOUNTER — Observation Stay (HOSPITAL_COMMUNITY): Payer: Medicare Other

## 2014-12-18 DIAGNOSIS — Z96652 Presence of left artificial knee joint: Secondary | ICD-10-CM | POA: Diagnosis present

## 2014-12-18 DIAGNOSIS — Z79899 Other long term (current) drug therapy: Secondary | ICD-10-CM | POA: Diagnosis not present

## 2014-12-18 DIAGNOSIS — M25522 Pain in left elbow: Secondary | ICD-10-CM | POA: Diagnosis present

## 2014-12-18 DIAGNOSIS — Z8249 Family history of ischemic heart disease and other diseases of the circulatory system: Secondary | ICD-10-CM | POA: Diagnosis not present

## 2014-12-18 DIAGNOSIS — I4510 Unspecified right bundle-branch block: Secondary | ICD-10-CM | POA: Diagnosis present

## 2014-12-18 DIAGNOSIS — Z008 Encounter for other general examination: Secondary | ICD-10-CM | POA: Diagnosis present

## 2014-12-18 DIAGNOSIS — Z888 Allergy status to other drugs, medicaments and biological substances status: Secondary | ICD-10-CM | POA: Diagnosis not present

## 2014-12-18 DIAGNOSIS — Z880 Allergy status to penicillin: Secondary | ICD-10-CM | POA: Diagnosis not present

## 2014-12-18 DIAGNOSIS — G35 Multiple sclerosis: Secondary | ICD-10-CM | POA: Diagnosis present

## 2014-12-18 DIAGNOSIS — G8929 Other chronic pain: Secondary | ICD-10-CM | POA: Diagnosis not present

## 2014-12-18 DIAGNOSIS — G629 Polyneuropathy, unspecified: Secondary | ICD-10-CM | POA: Diagnosis present

## 2014-12-18 DIAGNOSIS — E780 Pure hypercholesterolemia: Secondary | ICD-10-CM | POA: Diagnosis present

## 2014-12-18 DIAGNOSIS — G4733 Obstructive sleep apnea (adult) (pediatric): Secondary | ICD-10-CM | POA: Diagnosis not present

## 2014-12-18 DIAGNOSIS — M1990 Unspecified osteoarthritis, unspecified site: Secondary | ICD-10-CM | POA: Diagnosis not present

## 2014-12-18 DIAGNOSIS — I639 Cerebral infarction, unspecified: Secondary | ICD-10-CM | POA: Diagnosis present

## 2014-12-18 DIAGNOSIS — I10 Essential (primary) hypertension: Secondary | ICD-10-CM | POA: Diagnosis not present

## 2014-12-18 DIAGNOSIS — E039 Hypothyroidism, unspecified: Secondary | ICD-10-CM | POA: Diagnosis not present

## 2014-12-18 DIAGNOSIS — Y92009 Unspecified place in unspecified non-institutional (private) residence as the place of occurrence of the external cause: Secondary | ICD-10-CM | POA: Diagnosis not present

## 2014-12-18 DIAGNOSIS — Z90710 Acquired absence of both cervix and uterus: Secondary | ICD-10-CM | POA: Diagnosis not present

## 2014-12-18 DIAGNOSIS — G894 Chronic pain syndrome: Secondary | ICD-10-CM | POA: Diagnosis present

## 2014-12-18 DIAGNOSIS — J209 Acute bronchitis, unspecified: Secondary | ICD-10-CM | POA: Diagnosis present

## 2014-12-18 DIAGNOSIS — F17210 Nicotine dependence, cigarettes, uncomplicated: Secondary | ICD-10-CM | POA: Diagnosis present

## 2014-12-18 DIAGNOSIS — F419 Anxiety disorder, unspecified: Secondary | ICD-10-CM | POA: Diagnosis not present

## 2014-12-18 DIAGNOSIS — Z7982 Long term (current) use of aspirin: Secondary | ICD-10-CM | POA: Diagnosis not present

## 2014-12-18 DIAGNOSIS — K589 Irritable bowel syndrome without diarrhea: Secondary | ICD-10-CM | POA: Diagnosis present

## 2014-12-18 DIAGNOSIS — Z87448 Personal history of other diseases of urinary system: Secondary | ICD-10-CM | POA: Diagnosis not present

## 2014-12-18 DIAGNOSIS — I6789 Other cerebrovascular disease: Secondary | ICD-10-CM | POA: Diagnosis not present

## 2014-12-18 DIAGNOSIS — R079 Chest pain, unspecified: Secondary | ICD-10-CM | POA: Diagnosis present

## 2014-12-18 DIAGNOSIS — Z872 Personal history of diseases of the skin and subcutaneous tissue: Secondary | ICD-10-CM | POA: Diagnosis not present

## 2014-12-18 DIAGNOSIS — Z87828 Personal history of other (healed) physical injury and trauma: Secondary | ICD-10-CM | POA: Diagnosis not present

## 2014-12-18 DIAGNOSIS — M25529 Pain in unspecified elbow: Secondary | ICD-10-CM | POA: Diagnosis not present

## 2014-12-18 DIAGNOSIS — G8190 Hemiplegia, unspecified affecting unspecified side: Secondary | ICD-10-CM | POA: Diagnosis not present

## 2014-12-18 DIAGNOSIS — I712 Thoracic aortic aneurysm, without rupture: Secondary | ICD-10-CM | POA: Diagnosis present

## 2014-12-18 DIAGNOSIS — F329 Major depressive disorder, single episode, unspecified: Secondary | ICD-10-CM | POA: Diagnosis not present

## 2014-12-18 DIAGNOSIS — E669 Obesity, unspecified: Secondary | ICD-10-CM | POA: Diagnosis not present

## 2014-12-18 DIAGNOSIS — Z720 Tobacco use: Secondary | ICD-10-CM | POA: Diagnosis not present

## 2014-12-18 DIAGNOSIS — Z66 Do not resuscitate: Secondary | ICD-10-CM | POA: Diagnosis present

## 2014-12-18 DIAGNOSIS — N319 Neuromuscular dysfunction of bladder, unspecified: Secondary | ICD-10-CM | POA: Diagnosis present

## 2014-12-18 DIAGNOSIS — I63411 Cerebral infarction due to embolism of right middle cerebral artery: Secondary | ICD-10-CM | POA: Diagnosis present

## 2014-12-18 DIAGNOSIS — E785 Hyperlipidemia, unspecified: Secondary | ICD-10-CM | POA: Diagnosis not present

## 2014-12-18 DIAGNOSIS — Z8673 Personal history of transient ischemic attack (TIA), and cerebral infarction without residual deficits: Secondary | ICD-10-CM | POA: Diagnosis not present

## 2014-12-18 DIAGNOSIS — F319 Bipolar disorder, unspecified: Secondary | ICD-10-CM | POA: Diagnosis present

## 2014-12-18 DIAGNOSIS — Z6839 Body mass index (BMI) 39.0-39.9, adult: Secondary | ICD-10-CM | POA: Diagnosis not present

## 2014-12-18 DIAGNOSIS — W19XXXA Unspecified fall, initial encounter: Secondary | ICD-10-CM | POA: Diagnosis not present

## 2014-12-18 DIAGNOSIS — E119 Type 2 diabetes mellitus without complications: Secondary | ICD-10-CM | POA: Diagnosis present

## 2014-12-18 DIAGNOSIS — Z885 Allergy status to narcotic agent status: Secondary | ICD-10-CM | POA: Diagnosis not present

## 2014-12-18 DIAGNOSIS — Z8719 Personal history of other diseases of the digestive system: Secondary | ICD-10-CM | POA: Diagnosis not present

## 2014-12-18 NOTE — Progress Notes (Signed)
[facility] PROGRESS NOTE  Sacoya Mcgourty ZOX:[id number] DOB: [birth year] DOA: 12/16/2014 PCP: Ezequiel Kayser, MD Interim summary: 74 y.o. female73 y.o. Female with h/o hypertension, aortic aneurysm, knee replacement on the left in October 2015, hypothyroidism, came in for multiple falls and a syncopal episode, . She also reported some chest pain, on admission.  Assessment/Plan: 1.  Multiple falls / Acute Stroke/ syncope: Unclear when her symptoms started. As per her daughter she has been having multiple falls in the past.  On aspirin 325 mg daily.  Neurology consulted. Plan to transfer to [facility].  PT/OT consulted .  Echocardiogram and carotid duplex ordered. Lipid panel ordered.  hgba1c ordered.   Serial troponins are negative.    Hypertension: Controlled.   Hypothyroidism: Resume synthroid.    Chronic pain syndrome: Resume home medications. Held clonazepam as she remained lethargic.   Acute bronchitis: Started on levaquin.   Code Status: full code.  Family Communication:discussed with daughter in detail.  Disposition Plan: [facility].    Consultants:  neurology  Procedures:  MRI brain  Antibiotics:  levaquin for bronchitis  HPI/Subjective: Alert and answering questions appropriately.   Objective: Filed Vitals:   12/18/14 1056  BP: 149/91  Pulse: 85  Temp: 98.4 F (36.9 C)  Resp: 20    Intake/Output Summary (Last 24 hours) at 12/18/14 1422 Last data filed at 12/18/14 0600  Gross per 24 hour  Intake  858.4 ml  Output      0 ml  Net  858.4 ml   Filed Weights   12/17/14 0055  Weight: 107.1 kg (236 lb 1.8 oz)    Exam:   General:  Arousable, but answering questions appropriately.   Cardiovascular: s1s2, no m/r/g,   Respiratory: diminished at bases, no wheezing or rhonchi  Abdomen: soft obese, non tender non distended bowel sounds heard  Musculoskeletal: no cyanosis or edema. Tenderness in the left elbow.    Neuro:lethargic, but arousable and answering questions appropriately. Able to move all extremities.   Did not test gait as she is very unsteady at baseline.   Data Reviewed: Basic Metabolic Panel:  Recent Labs Lab 12/16/14 1546 12/17/14 0244  NA 139 140  K 4.8 3.9  CL 103 105  CO2 27 26  GLUCOSE 102* 121*  BUN 24* 21*  CREATININE 1.01* 0.91  CALCIUM 9.2 9.1   Liver Function Tests:  Recent Labs Lab 12/16/14 1546  AST 123*  ALT 81*  ALKPHOS 71  BILITOT 1.8*  PROT 7.4  ALBUMIN 3.9    Recent Labs Lab 12/16/14 1546  LIPASE 14*   No results for input(s): AMMONIA in the last 168 hours. CBC:  Recent Labs Lab 12/16/14 1622 12/17/14 0244  WBC 13.9* 11.1*  NEUTROABS 9.8*  --   HGB 15.1* 14.8  HCT 46.3* 44.1  MCV 89.7 90.2  PLT 322 313   Cardiac Enzymes:  Recent Labs Lab 12/16/14 1546 12/17/14 0244 12/17/14 0825 12/17/14 1433  TROPONINI <0.03 <0.03 <0.03 <0.03   BNP (last 3 results)  Recent Labs  11/27/14 1827  BNP 38.8    ProBNP (last 3 results) No results for input(s): PROBNP in the last 8760 hours.  CBG:  Recent Labs Lab 12/16/14 2107  GLUCAP 135*    No results found for this or any previous visit (from the past 240 hour(s)).   Studies: Dg Forearm Left  12/17/2014   CLINICAL DATA:  Larey Seat yesterday.  Pain in the left elbow.  EXAM: LEFT FOREARM - 2 VIEW  COMPARISON:  None.  FINDINGS: Radiopaque density is identified along the anteromedial aspect of the forearm and may represent foreign body or object external to the patient. There is no evidence for acute fracture or dislocation. No soft tissue gas.  IMPRESSION: Possible foreign body in the soft tissues of the forearm.  No fracture.  If there is focal pain in the elbow consider dedicated views of the elbow.   Electronically Signed   By: Norva Pavlov M.D.   On: 12/17/2014 14:45   Ct Head Wo Contrast  12/16/2014   CLINICAL DATA:  Status post fall, generalized weakness  EXAM: CT HEAD  WITHOUT CONTRAST  TECHNIQUE: Contiguous axial images were obtained from the base of the skull through the vertex without intravenous contrast.  COMPARISON:  05/17/2014  FINDINGS: No evidence of parenchymal hemorrhage or extra-axial fluid collection. No mass lesion, mass effect, or midline shift.  No CT evidence of acute infarction.  Subcortical white matter and periventricular small vessel ischemic changes. Mild intracranial atherosclerosis.  Cerebral volume is within normal limits.  No ventriculomegaly.  The visualized paranasal sinuses are essentially clear. The mastoid air cells are unopacified.  No evidence of calvarial fracture.  IMPRESSION: No evidence of acute intracranial abnormality.  Small vessel ischemic changes.   Electronically Signed   By: Charline Bills M.D.   On: 12/16/2014 23:15   Mr Maxine Glenn Head Wo Contrast  12/18/2014   CLINICAL DATA:  Recent fall at home without head injury. No reported loss of consciousness.  EXAM: MRI HEAD WITHOUT CONTRAST  MRA HEAD WITHOUT CONTRAST  TECHNIQUE: Multiplanar, multiecho pulse sequences of the brain and surrounding structures were obtained without intravenous contrast. Angiographic images of the head were obtained using MRA technique without contrast.  COMPARISON:  Head CT 12/16/2014 and MRI 09/18/2010  FINDINGS: MRI HEAD FINDINGS  The examination had to be discontinued prior to completion due to patient's inability to tolerate being in the scanner any longer and inability to remain motionless. Patient declined further imaging and medication for anxiolysis. Axial and coronal diffusion, sagittal T1, and axial T2 weighted images were obtained.  Anterior right temporal lobe is obscured on diffusion-weighted imaging due to susceptibility artifact. There are multiple patchy areas of acute infarction in the right MCA territory involving cortex and white matter of the posterior frontal lobe, parietal lobe, posterior temporal lobe, and insula/external capsule. There is  associated mild cytotoxic edema without mass effect. No parenchymal hematoma is seen.  There is no evidence of mass, midline shift, or extra-axial fluid collection. Ventricles and sulci are within normal limits for age. Patchy and confluent T2 hyperintensities in the subcortical and deep cerebral white matter and in the pons are nonspecific but compatible with rather extensive chronic small vessel ischemic disease. Chronic ischemic changes are also noted in the thalami. Chronic lacunar infarcts are present in the centrum semiovale bilaterally and in the anterior limb of the left internal capsule.  Prior bilateral cataract extraction is noted. Small left sphenoid sinus mucous retention cyst is noted. Mastoid air cells are clear. Major intracranial vascular flow voids are preserved.  MRA HEAD FINDINGS  Images are mildly to moderately motion degraded.  The visualized distal vertebral arteries are patent and codominant. PICA and SCA origins are patent. Basilar artery is patent and mildly tortuous without evidence of significant stenosis. Posterior communicating arteries are not clearly identified. P1 and proximal P2 segments are patent bilaterally without evidence of significant stenosis. More distal PCAs are not well evaluated.  Intracranial internal carotid arteries  are patent without evidence of significant stenosis. ACAs are patent without evidence of proximal stenosis. M1 segments are patent without stenosis. M2 trunks are patent bilaterally. Evaluation of MCA branch vessels is limited due to degraded image quality, with suggestion of a slightly diminished number of more distal MCA branches on the left compared to the right. No intracranial aneurysm is identified.  IMPRESSION: 1. Motion degraded, incomplete examination as above. 2. Patchy right MCA territory acute infarcts. No evidence of hemorrhage. 3. Advanced chronic small vessel ischemic disease. 4. No evidence of major intracranial arterial occlusion or  significant proximal stenosis. Suboptimal branch vessel evaluation.   Electronically Signed   By: Sebastian Ache   On: 12/18/2014 10:00   Mr Brain Wo Contrast  12/18/2014   CLINICAL DATA:  Recent fall at home without head injury. No reported loss of consciousness.  EXAM: MRI HEAD WITHOUT CONTRAST  MRA HEAD WITHOUT CONTRAST  TECHNIQUE: Multiplanar, multiecho pulse sequences of the brain and surrounding structures were obtained without intravenous contrast. Angiographic images of the head were obtained using MRA technique without contrast.  COMPARISON:  Head CT 12/16/2014 and MRI 09/18/2010  FINDINGS: MRI HEAD FINDINGS  The examination had to be discontinued prior to completion due to patient's inability to tolerate being in the scanner any longer and inability to remain motionless. Patient declined further imaging and medication for anxiolysis. Axial and coronal diffusion, sagittal T1, and axial T2 weighted images were obtained.  Anterior right temporal lobe is obscured on diffusion-weighted imaging due to susceptibility artifact. There are multiple patchy areas of acute infarction in the right MCA territory involving cortex and white matter of the posterior frontal lobe, parietal lobe, posterior temporal lobe, and insula/external capsule. There is associated mild cytotoxic edema without mass effect. No parenchymal hematoma is seen.  There is no evidence of mass, midline shift, or extra-axial fluid collection. Ventricles and sulci are within normal limits for age. Patchy and confluent T2 hyperintensities in the subcortical and deep cerebral white matter and in the pons are nonspecific but compatible with rather extensive chronic small vessel ischemic disease. Chronic ischemic changes are also noted in the thalami. Chronic lacunar infarcts are present in the centrum semiovale bilaterally and in the anterior limb of the left internal capsule.  Prior bilateral cataract extraction is noted. Small left sphenoid sinus  mucous retention cyst is noted. Mastoid air cells are clear. Major intracranial vascular flow voids are preserved.  MRA HEAD FINDINGS  Images are mildly to moderately motion degraded.  The visualized distal vertebral arteries are patent and codominant. PICA and SCA origins are patent. Basilar artery is patent and mildly tortuous without evidence of significant stenosis. Posterior communicating arteries are not clearly identified. P1 and proximal P2 segments are patent bilaterally without evidence of significant stenosis. More distal PCAs are not well evaluated.  Intracranial internal carotid arteries are patent without evidence of significant stenosis. ACAs are patent without evidence of proximal stenosis. M1 segments are patent without stenosis. M2 trunks are patent bilaterally. Evaluation of MCA branch vessels is limited due to degraded image quality, with suggestion of a slightly diminished number of more distal MCA branches on the left compared to the right. No intracranial aneurysm is identified.  IMPRESSION: 1. Motion degraded, incomplete examination as above. 2. Patchy right MCA territory acute infarcts. No evidence of hemorrhage. 3. Advanced chronic small vessel ischemic disease. 4. No evidence of major intracranial arterial occlusion or significant proximal stenosis. Suboptimal branch vessel evaluation.   Electronically Signed   By:  Sebastian Ache   On: 12/18/2014 10:00   Ct Elbow Left W/o Cm  12/17/2014   CLINICAL DATA:  Fall, left elbow pain  EXAM: CT OF THE LEFT ELBOW WITHOUT CONTRAST  TECHNIQUE: Multidetector CT imaging was performed according to the standard protocol. Multiplanar CT image reconstructions were also generated.  COMPARISON:  Forearm radiograph performed earlier today  FINDINGS: No fracture or dislocation.  No joint effusion.  IMPRESSION: Negative. No that the area of possible foreign body seen on radiograph is not included on this examination.   Electronically Signed   By: Esperanza Heir M.D.   On: 12/17/2014 21:29   Dg Chest Port 1 View  12/17/2014   CLINICAL DATA:  Fall, weakness  EXAM: PORTABLE CHEST - 1 VIEW  COMPARISON:  CT chest dated 11/27/2014  FINDINGS: Lungs are essentially clear.  No pleural effusion or pneumothorax.  Cardiomegaly.  IMPRESSION: No evidence of acute cardiopulmonary disease.   Electronically Signed   By: Charline Bills M.D.   On: 12/17/2014 00:12    Scheduled Meds: . aspirin EC  325 mg Oral Daily  . atorvastatin  20 mg Oral q1800  . buPROPion  300 mg Oral Daily  . cholecalciferol  4,000 Units Oral BID  . enoxaparin (LOVENOX) injection  40 mg Subcutaneous Q24H  . FLUoxetine  40 mg Oral Daily  . gabapentin  100 mg Oral 3 times per day  . levofloxacin  500 mg Oral Daily  . levothyroxine  200 mcg Oral QAC breakfast  . losartan  100 mg Oral Daily  . nicotine  7 mg Transdermal Daily  . nitroGLYCERIN  0.5 inch Topical 4 times per day  . pantoprazole  40 mg Oral Daily  . sodium chloride  3 mL Intravenous Q12H  . sodium chloride  3 mL Intravenous Q12H   Continuous Infusions: . sodium chloride 75 mL/hr at 12/18/14 1610    Principal Problem:   Chest pain Active Problems:   HTN (hypertension)   Tobacco dependence   Hypothyroidism   Syncope   Fall   Faintness   Elbow pain   Acute CVA (cerebrovascular accident)    Time spent:45 minutes    Reta Norgren  [facility] Pager [phone]. If 7PM-7AM, please contact night-coverage at [email or web address], password [email or web address] 12/18/2014, 2:22 PM  LOS: 0 days

## 2014-12-18 NOTE — Progress Notes (Signed)
Pt arrived to [facility] via [facility].  Pt welcomed and settled into the room, no family at bedside.  Pt very lethargic but arouses to voice.  No c/o pain or distress noted/voiced.  Telemetry applied and CCMD notified.  VSS.  Will continue to monitor. Sondra Come, RN

## 2014-12-18 NOTE — Progress Notes (Signed)
Patient remains lethargic but is easily aroused. Had CT of lt elbow done last night and no acute findings noted. Daughter Zella Ball does not want her sister Amy to receive any information on patient. HIPPA password in place. IV was replaced this morning after it was accidentally displaced. Incontinent of urine. Barrier cream applied to buttocks after each inc episode. Continues on Nitro paste and nicotine patch.

## 2014-12-19 ENCOUNTER — Observation Stay (HOSPITAL_COMMUNITY): Payer: Medicare Other

## 2014-12-19 DIAGNOSIS — I639 Cerebral infarction, unspecified: Secondary | ICD-10-CM

## 2014-12-19 DIAGNOSIS — R079 Chest pain, unspecified: Secondary | ICD-10-CM | POA: Insufficient documentation

## 2014-12-19 DIAGNOSIS — W19XXXA Unspecified fall, initial encounter: Secondary | ICD-10-CM

## 2014-12-19 DIAGNOSIS — M25529 Pain in unspecified elbow: Secondary | ICD-10-CM

## 2014-12-19 LAB — LIPID PANEL
Cholesterol: 163 mg/dL (ref 0–200)
HDL: 52 mg/dL (ref >40)
LDL CALC: 96 mg/dL (ref 0–99)
Total CHOL/HDL Ratio: 3.1 RATIO
Triglycerides: 76 mg/dL (ref <150)
VLDL: 15 mg/dL (ref 0–40)

## 2014-12-19 MED ORDER — CETYLPYRIDINIUM CHLORIDE 0.05 % MT LIQD
7.0000 mL | Freq: Two times a day (BID) | OROMUCOSAL | Status: DC
  Administered 2014-12-19 – 2014-12-21 (×3): 7 mL via OROMUCOSAL

## 2014-12-19 MED ORDER — CELECOXIB 200 MG PO CAPS
200.0000 mg | ORAL_CAPSULE | Freq: Every day | ORAL | Status: DC
  Administered 2014-12-19 – 2014-12-22 (×4): 200 mg via ORAL
  Filled 2014-12-19 (×4): qty 1

## 2014-12-19 MED ORDER — OXYCODONE HCL 5 MG PO TABS
2.5000 mg | ORAL_TABLET | ORAL | Status: DC | PRN
  Administered 2014-12-19: 2.5 mg via ORAL
  Filled 2014-12-19: qty 1

## 2014-12-19 MED ORDER — RESOURCE THICKENUP CLEAR PO POWD
ORAL | Status: DC | PRN
  Filled 2014-12-19: qty 125

## 2014-12-19 NOTE — Progress Notes (Signed)
Physical Therapy Treatment Patient Details Name: [name] [name] MRN: [id number] DOB: 10-22-1940 Today's Date: 12/19/2014    History of Present Illness [name] [name] is a 74 y.o. female with a history of HTN, Aortic Aneurysm, Dyslipidemia, and Hypothyroid who presented to the ED 12/16/14  with complaints of sharp intermittent Left sided chest pain rated at a 5/10 associated with SOB Nausea and No Vomiting or Diaphoresis today. She also had a fell today Without injury. She was seen in the ED earlier and discharged but returned after suffering a syncopal episode and was referred for medical admission. MRI revealed Right MCA infarcts.    PT Comments    Pt with sleepiness this session but did participate in exercises at EOB and did tolerate standing x 3. Pt demonstrating progress towards all goals. Acute PT to con't to follow to progress ambulation.  Follow Up Recommendations  CIR     Equipment Recommendations  None recommended by PT    Recommendations for Other Services       Precautions / Restrictions Precautions Precautions: Fall Restrictions Weight Bearing Restrictions: No    Mobility  Bed Mobility Overal bed mobility: Needs Assistance Bed Mobility: Supine to Sit;Sit to Supine     Supine to sit: Mod assist Sit to supine: Mod assist   General bed mobility comments: assist to for trunk elevation up to sitting and LEs back into bed, assist to scoot up in bed  Transfers Overall transfer level: Needs assistance Equipment used: Rolling walker (2 wheeled) Transfers: Sit to/from Stand Sit to Stand: Mod assist         General transfer comment: completed 3 stands, assist to initiate transfer and to place L hand on walker  Ambulation/Gait                 Stairs            Wheelchair Mobility    Modified Rankin (Stroke Patients Only)       Balance Overall balance assessment: Needs assistance         Standing balance support: Bilateral upper  extremity supported;During functional activity (standing exercises) Standing balance-Leahy Scale: Poor Standing balance comment: stood x 3, each 60-90 seconds                    Cognition Arousal/Alertness: Lethargic Behavior During Therapy: Flat affect Overall Cognitive Status: Within Functional Limits for tasks assessed                      Exercises General Exercises - Lower Extremity Long Arc Quad: AROM;Strengthening;Both;20 reps Heel Slides: Both;Strengthening;20 reps;Seated (resisted) Hip Flexion/Marching: AROM;Both;Other reps (comment);Standing (3 sets of 10, (3 separate stands)) Mini-Sqauts:  (pt defered due to "they make me fall")    General Comments        Pertinent Vitals/Pain Pain Assessment: No/denies pain    Home Living                      Prior Function            PT Goals (current goals can now be found in the care plan section) Progress towards PT goals: Progressing toward goals    Frequency  Min 3X/week    PT Plan Discharge plan needs to be updated    Co-evaluation             End of Session Equipment Utilized During Treatment: Gait belt Activity Tolerance: Patient limited by lethargy Patient left: in  bed;with call bell/phone within reach;with bed alarm set     Time: 1610-9604 PT Time Calculation (min) (ACUTE ONLY): 19 min  Charges:  $Therapeutic Exercise: 8-22 mins                    G Codes:      Marcene Brawn 12/19/2014, 3:40 PM  Lewis Shock, PT, DPT Pager #: [phone] Office #: [phone]
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
## 2014-12-19 NOTE — Progress Notes (Signed)
[facility] PROGRESS NOTE  [name] [name] ZOX:[id number] DOB: Jan 27, 1941 DOA: 12/16/2014 PCP: Ezequiel Kayser, MD Interim summary: 74 y.o. ? h/o admission 04/2014 Polypharmacy and acute metabolic encephalopathy, hypertension, aortic aneurysm, knee replacement on the left in October 2015, non displaced obliq dist fib #, falls Body mass index is 39.29 kg/(m^2).  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 She also reported some chest pain, on admission.    Assessment/Plan: 1.  Multiple falls / Acute Stroke/ syncope: Unclear when her symptoms started Daughter says she has been having multiple falls in the past.  On aspirin 325 mg daily.  Neurology consult appreciated PT/OT consulted and recommend CIR -I am not sure she can sustain ~ 3 hours of therapy wbut will ask for consult Echocardiogram and carotid duplex ordered-pending Lipid panel =LDL 96-continue Pravachol 40 ddaily hgba1c pending Serial troponins are negative.   Risk aspiration -patient noted by RN to have some risk of aspiration as coughing SLP evaluated her and rec Dys 3 diet  Hypertension: Fair Controlled on losartan 100 daily, Norvasc 5 mg Might add HCTZ in am  Hypothyroidism: Resume synthroid 200 daily  Chronic pain syndrome: Resume home medications gabapentin. Baclofen 10 bid, . Held clonazepam as she remained lethargic.   Bipolar-Neuropathy Continue Wellbutrin XL 300 q 24 Prozac 40 daily  Acute bronchitis: Started on levaquin.   Code Status: full code.  Family Communication:no family at bedsdie Disposition Plan: [facility] to [facility].    Consultants:  neurology  Procedures:  MRI brain  Antibiotics:  levaquin for bronchitis d/c 12/19/14  HPI/Subjective:  Sleepy but rousable oriented No other issues Doesn't have her BIpap NO cp n v sob doe  Objective: Filed Vitals:   12/19/14 1015  BP:  140/88  Pulse: 75  Temp: 98.1 F (36.7 C)  Resp: 18   No intake or output data in the 24 hours ending 12/19/14 1623 Filed Weights   12/17/14 0055  Weight: 107.1 kg (236 lb 1.8 oz)    Exam:   General:  Arousable, but answering questions appropriately.   Cardiovascular: s1s2, no m/r/g,   Respiratory: diminished at bases, no wheezing or rhonchi  Abdomen: soft obese, non tender non distended bowel sounds heard  Musculoskeletal: no cyanosis or edema. Tenderness in the left elbow.   Neuro:lethargic, but arousable and answering questions appropriately. Able to move all extremities.   Did not test gait as she is very unsteady at baseline.   Data Reviewed: Basic Metabolic Panel:  Recent Labs Lab 12/16/14 1546 12/17/14 0244  NA 139 140  K 4.8 3.9  CL 103 105  CO2 27 26  GLUCOSE 102* 121*  BUN 24* 21*  CREATININE 1.01* 0.91  CALCIUM 9.2 9.1   Liver Function Tests:  Recent Labs Lab 12/16/14 1546  AST 123*  ALT 81*  ALKPHOS 71  BILITOT 1.8*  PROT 7.4  ALBUMIN 3.9    Recent Labs Lab 12/16/14 1546  LIPASE 14*   No results for input(s): AMMONIA in the last 168 hours. CBC:  Recent Labs Lab 12/16/14 1622 12/17/14 0244  WBC 13.9* 11.1*  NEUTROABS 9.8*  --   HGB 15.1* 14.8  HCT 46.3* 44.1  MCV 89.7 90.2  PLT 322 313   Cardiac Enzymes:  Recent Labs Lab 12/16/14 1546 12/17/14 0244 12/17/14 0825 12/17/14 1433  TROPONINI <0.03 <0.03 <0.03 <0.03   BNP (last 3 results)  Recent Labs  11/27/14 1827  BNP 38.8    ProBNP (last 3 results) No results for input(s): PROBNP in the last 8760 hours.  CBG:  Recent Labs Lab 12/16/14 2107  GLUCAP 135*    No results found for this or any previous visit (from the past 240 hour(s)).   Studies: Mr Shirlee Latch Wo Contrast  12/18/2014   CLINICAL DATA:  Recent fall at home without head injury. No reported loss of consciousness.  EXAM: MRI HEAD WITHOUT CONTRAST  MRA HEAD WITHOUT CONTRAST  TECHNIQUE:  Multiplanar, multiecho pulse sequences of the brain and surrounding structures were obtained without intravenous contrast. Angiographic images of the head were obtained using MRA technique without contrast.  COMPARISON:  Head CT 12/16/2014 and MRI 09/18/2010  FINDINGS: MRI HEAD FINDINGS  The examination had to be discontinued prior to completion due to patient's inability to tolerate being in the scanner any longer and inability to remain motionless. Patient declined further imaging and medication for anxiolysis. Axial and coronal diffusion, sagittal T1, and axial T2 weighted images were obtained.  Anterior right temporal lobe is obscured on diffusion-weighted imaging due to susceptibility artifact. There are multiple patchy areas of acute infarction in the right MCA territory involving cortex and white matter of the posterior frontal lobe, parietal lobe, posterior temporal lobe, and insula/external capsule. There is associated mild cytotoxic edema without mass effect. No parenchymal hematoma is seen.  There is no evidence of mass, midline shift, or extra-axial fluid collection. Ventricles and sulci are within normal limits for age. Patchy and confluent T2 hyperintensities in the subcortical and deep cerebral white matter and in the pons are nonspecific but compatible with rather extensive chronic small vessel ischemic disease. Chronic ischemic changes are also noted in the thalami. Chronic lacunar infarcts are present in the centrum semiovale bilaterally and in the anterior limb of the left internal capsule.  Prior bilateral cataract extraction is noted. Small left sphenoid sinus mucous retention cyst is noted. Mastoid air cells are clear. Major intracranial vascular flow voids are preserved.  MRA HEAD FINDINGS  Images are mildly to moderately motion degraded.  The visualized distal vertebral arteries are patent and codominant. PICA and SCA origins are patent. Basilar artery is patent and mildly tortuous without  evidence of significant stenosis. Posterior communicating arteries are not clearly identified. P1 and proximal P2 segments are patent bilaterally without evidence of significant stenosis. More distal PCAs are not well evaluated.  Intracranial internal carotid arteries are patent without evidence of significant stenosis. ACAs are patent without evidence of proximal stenosis. M1 segments are patent without stenosis. M2 trunks are patent bilaterally. Evaluation of MCA branch vessels is limited due to degraded image quality, with suggestion of a slightly diminished number of more distal MCA branches on the left compared to the right. No intracranial aneurysm is identified.  IMPRESSION: 1. Motion degraded, incomplete examination as above. 2. Patchy right MCA territory acute infarcts. No evidence of hemorrhage. 3. Advanced chronic small vessel ischemic disease. 4. No evidence of major intracranial arterial occlusion or significant proximal stenosis. Suboptimal branch vessel evaluation.   Electronically Signed   By: Sebastian Ache   On: 12/18/2014 10:00   Mr Brain Wo Contrast  12/18/2014   CLINICAL DATA:  Recent fall at home without head injury. No reported loss of consciousness.  EXAM: MRI HEAD WITHOUT CONTRAST  MRA HEAD WITHOUT CONTRAST  TECHNIQUE: Multiplanar, multiecho pulse sequences of the brain and surrounding structures were obtained without intravenous contrast. Angiographic images of the head were obtained using MRA technique  without contrast.  COMPARISON:  Head CT 12/16/2014 and MRI 09/18/2010  FINDINGS: MRI HEAD FINDINGS  The examination had to be discontinued prior to completion due to patient's inability to tolerate being in the scanner any longer and inability to remain motionless. Patient declined further imaging and medication for anxiolysis. Axial and coronal diffusion, sagittal T1, and axial T2 weighted images were obtained.  Anterior right temporal lobe is obscured on diffusion-weighted imaging due to  susceptibility artifact. There are multiple patchy areas of acute infarction in the right MCA territory involving cortex and white matter of the posterior frontal lobe, parietal lobe, posterior temporal lobe, and insula/external capsule. There is associated mild cytotoxic edema without mass effect. No parenchymal hematoma is seen.  There is no evidence of mass, midline shift, or extra-axial fluid collection. Ventricles and sulci are within normal limits for age. Patchy and confluent T2 hyperintensities in the subcortical and deep cerebral white matter and in the pons are nonspecific but compatible with rather extensive chronic small vessel ischemic disease. Chronic ischemic changes are also noted in the thalami. Chronic lacunar infarcts are present in the centrum semiovale bilaterally and in the anterior limb of the left internal capsule.  Prior bilateral cataract extraction is noted. Small left sphenoid sinus mucous retention cyst is noted. Mastoid air cells are clear. Major intracranial vascular flow voids are preserved.  MRA HEAD FINDINGS  Images are mildly to moderately motion degraded.  The visualized distal vertebral arteries are patent and codominant. PICA and SCA origins are patent. Basilar artery is patent and mildly tortuous without evidence of significant stenosis. Posterior communicating arteries are not clearly identified. P1 and proximal P2 segments are patent bilaterally without evidence of significant stenosis. More distal PCAs are not well evaluated.  Intracranial internal carotid arteries are patent without evidence of significant stenosis. ACAs are patent without evidence of proximal stenosis. M1 segments are patent without stenosis. M2 trunks are patent bilaterally. Evaluation of MCA branch vessels is limited due to degraded image quality, with suggestion of a slightly diminished number of more distal MCA branches on the left compared to the right. No intracranial aneurysm is identified.   IMPRESSION: 1. Motion degraded, incomplete examination as above. 2. Patchy right MCA territory acute infarcts. No evidence of hemorrhage. 3. Advanced chronic small vessel ischemic disease. 4. No evidence of major intracranial arterial occlusion or significant proximal stenosis. Suboptimal branch vessel evaluation.   Electronically Signed   By: Sebastian Ache   On: 12/18/2014 10:00   Ct Elbow Left W/o Cm  12/17/2014   CLINICAL DATA:  Fall, left elbow pain  EXAM: CT OF THE LEFT ELBOW WITHOUT CONTRAST  TECHNIQUE: Multidetector CT imaging was performed according to the standard protocol. Multiplanar CT image reconstructions were also generated.  COMPARISON:  Forearm radiograph performed earlier today  FINDINGS: No fracture or dislocation.  No joint effusion.  IMPRESSION: Negative. No that the area of possible foreign body seen on radiograph is not included on this examination.   Electronically Signed   By: Esperanza Heir M.D.   On: 12/17/2014 21:29    Scheduled Meds: . aspirin EC  325 mg Oral Daily  . atorvastatin  20 mg Oral q1800  . buPROPion  300 mg Oral Daily  . celecoxib  200 mg Oral Daily  . cholecalciferol  4,000 Units Oral BID  . enoxaparin (LOVENOX) injection  40 mg Subcutaneous Q24H  . FLUoxetine  40 mg Oral Daily  . gabapentin  100 mg Oral 3 times per day  .  levothyroxine  200 mcg Oral QAC breakfast  . losartan  100 mg Oral Daily  . nicotine  7 mg Transdermal Daily  . nitroGLYCERIN  0.5 inch Topical 4 times per day  . pantoprazole  40 mg Oral Daily  . sodium chloride  3 mL Intravenous Q12H  . sodium chloride  3 mL Intravenous Q12H   Continuous Infusions: . sodium chloride 75 mL/hr at 12/18/14 1610    Principal Problem:   Chest pain Active Problems:   HTN (hypertension)   Tobacco dependence   Hypothyroidism   Syncope   Fall   Faintness   Elbow pain   Acute CVA (cerebrovascular accident)    Time spent:15 minutes  Pleas Koch, MD [facility] (P)  [phone]

## 2014-12-19 NOTE — Consult Note (Signed)
Stroke Consult    Chief Complaint: fatigue  HPI: [name] [name] is an 74 y.o. female with a history of HTN, Aortic Aneurysm, Dyslipidemia, and Hypothyroid who presented to the ED with complaints of sharp intermittent Left sided chest pain and recurrent syncopal episodes. MRI brain completed and imaging reviewed. Shows patchy R MCA infarcts. She is not on a daily antiplatelet at home.   Patient denies any focal deficits, main concern is generalized fatigue.   Date last known well: 12/15/2014 Time last known well: unclear tPA Given: no, outside window   Past Medical History  Diagnosis Date  . HTN (hypertension)   . Ascending aortic aneurysm   . Dyslipidemia   . Tobacco dependence   . Hypothyroidism   . Depression   . Neurogenic bladder   . Obesity   . Dog bite(E906.0)     RLL with systemic inflammatory response  . Chronic low back pain   . Anxiety   . MS (multiple sclerosis)     Followed by Dr. Sandria Manly  . High cholesterol   . Aortic aneurysm   . Complication of anesthesia   . PONV (postoperative nausea and vomiting)   . Aortic aneurysm   . Numbness     "different spots"  . Arthritis   . History of ulcer disease 30 yrs ago  . IBS (irritable bowel syndrome)   . Incontinence in female   . Obstructive sleep apnea     not currently using c pap    Past Surgical History  Procedure Laterality Date  . Cholecystectomy    . Hysterectomy    . Nasal sinus surgery    . Left eye surgery    . Bilateral foot surgery    . Leg surgery  2006    due to fx leg   . Back surgery    . Total knee arthroplasty Left 04/11/2014    Procedure: LEFT TOTAL KNEE ARTHROPLASTY;  Surgeon: Shelda Pal, MD;  Location: [facility];  Service: Orthopedics;  Laterality: Left;    History reviewed. No pertinent family history. Social History:  reports that she has been smoking Cigarettes.  She has a 15 pack-year smoking history. She has never used smokeless tobacco. She reports that she drinks alcohol. She  reports that she does not use illicit drugs.  Allergies:  Allergies  Allergen Reactions  . Codeine Other (See Comments)    Real bad chest pains  . Lisinopril Cough  . Demerol Nausea Only  . Penicillins Rash and Other (See Comments)    Pt not sure "chestpains"    Medications Prior to Admission  Medication Sig Dispense Refill  . amLODipine (NORVASC) 5 MG tablet Take 5 mg by mouth daily.    Marland Kitchen atorvastatin (LIPITOR) 20 MG tablet Take 20 mg by mouth daily.    . baclofen (LIORESAL) 10 MG tablet Take 10 mg by mouth 2 (two) times daily as needed for muscle spasms.    Marland Kitchen buPROPion (WELLBUTRIN XL) 300 MG 24 hr tablet Take 1 tablet by mouth daily.     . Cholecalciferol (VITAMIN D) 2000 UNITS tablet Take 4,000 Units by mouth 2 (two) times daily.     . clonazePAM (KLONOPIN) 1 MG tablet Take 1 mg by mouth 3 (three) times daily.    . Cyanocobalamin (VITAMIN B 12 PO) Take 20,000-25,000 mcg by mouth every morning.    . diclofenac sodium (VOLTAREN) 1 % GEL Apply 2 g topically every 8 (eight) hours as needed (pain).    Marland Kitchen  diphenoxylate-atropine (LOMOTIL) 2.5-0.025 MG per tablet Take 2 tablets by mouth 4 (four) times daily as needed for diarrhea or loose stools.     Marland Kitchen FLUoxetine (PROZAC) 40 MG capsule Take 40 mg by mouth every morning.     . gabapentin (NEURONTIN) 100 MG capsule Take 100-200 mg by mouth every 8 (eight) hours.     Marland Kitchen levothyroxine (SYNTHROID, LEVOTHROID) 200 MCG tablet Take 200 mcg by mouth daily before breakfast.    . losartan (COZAAR) 100 MG tablet Take 1 tablet by mouth every morning.     Marland Kitchen omeprazole (PRILOSEC) 20 MG capsule Take 20 mg by mouth daily as needed (pain).     . pantoprazole (PROTONIX) 40 MG tablet Take 40 mg by mouth 2 (two) times daily.    . pravastatin (PRAVACHOL) 40 MG tablet Take 40 mg by mouth at bedtime.    Marland Kitchen Umeclidinium-Vilanterol (ANORO ELLIPTA) 62.5-25 MCG/INH AEPB Inhale into the lungs daily.    Marland Kitchen levofloxacin (LEVAQUIN) 500 MG tablet Take 1 tablet (500 mg total)  by mouth daily. (Patient not taking: Reported on 11/27/2014) 4 tablet 0    ROS: Out of a complete 14 system review, the patient complains of only the following symptoms, and all other reviewed systems are negative. +fatigue   Physical Examination: Filed Vitals:   12/19/14 1015  BP: 140/88  Pulse: 75  Temp: 98.1 F (36.7 C)  Resp: 18   Physical Exam  Constitutional: He appears well-developed and well-nourished.  Psych: Affect appropriate to situation Eyes: No scleral injection HENT: No OP obstrucion Head: Normocephalic.  Cardiovascular: Normal rate and regular rhythm.  Respiratory: mild expiratory wheezing noted but in no respiratory distress GI: Soft. Bowel sounds are normal. No distension. There is no tenderness.  Skin: WDI  Neurologic Examination: Mental Status: Lethargic but easily aroused with voice, oriented, thought content appropriate.  Speech fluent without evidence of aphasia.  Able to follow 3 step commands without difficulty. Cranial Nerves: II: optic discs not visualized, visual fields grossly normal, pupils equal, round, reactive to light and accommodation III,IV, VI: ptosis not present, extra-ocular motions intact bilaterally V,VII: mild left sided weakness with smiling, facial light touch sensation normal bilaterally VIII: hearing normal bilaterally IX,X: gag reflex present XI: trapezius strength/neck flexion strength normal bilaterally XII: tongue strength normal  Motor: Right : Upper extremity    Left:     Upper extremity 5/5 deltoid       5-/5 deltoid 5/5 biceps      5-/5 biceps  5/5 triceps      5/5 triceps 5/5 hand grip      5/5 hand grip  Lower extremity     Lower extremity 5-/5 hip flexor      5-/5 hip flexor 5-/5 quadricep      5-/5 quadriceps  5/5 hamstrings     5/5 hamstrings 5/5 plantar flexion       5/5 plantar flexion 5/5 plantar extension     5/5 plantar extension Tone and bulk:normal tone throughout; no atrophy noted Sensory:  Pinprick and light touch intact throughout, bilaterally Deep Tendon Reflexes: 1+ and symmetric throughout Plantars: Right: downgoing   Left: downgoing Cerebellar: normal finger-to-nose, and normal heel-to-shin test Gait: deferred  Laboratory Studies:   Basic Metabolic Panel:  Recent Labs Lab 12/16/14 1546 12/17/14 0244  NA 139 140  K 4.8 3.9  CL 103 105  CO2 27 26  GLUCOSE 102* 121*  BUN 24* 21*  CREATININE 1.01* 0.91  CALCIUM 9.2 9.1  Liver Function Tests:  Recent Labs Lab 12/16/14 1546  AST 123*  ALT 81*  ALKPHOS 71  BILITOT 1.8*  PROT 7.4  ALBUMIN 3.9    Recent Labs Lab 12/16/14 1546  LIPASE 14*   No results for input(s): AMMONIA in the last 168 hours.  CBC:  Recent Labs Lab 12/16/14 1622 12/17/14 0244  WBC 13.9* 11.1*  NEUTROABS 9.8*  --   HGB 15.1* 14.8  HCT 46.3* 44.1  MCV 89.7 90.2  PLT 322 313    Cardiac Enzymes:  Recent Labs Lab 12/16/14 1546 12/17/14 0244 12/17/14 0825 12/17/14 1433  TROPONINI <0.03 <0.03 <0.03 <0.03    BNP: Invalid input(s): POCBNP  CBG:  Recent Labs Lab 12/16/14 2107  GLUCAP 135*    Microbiology: Results for orders placed or performed during the hospital encounter of 05/17/14  Culture, Urine     Status: None   Collection Time: 05/17/14  4:40 PM  Result Value Ref Range Status   Specimen Description URINE, CATHETERIZED  Final   Special Requests NONE  Final   Culture  Setup Time   Final    05/17/2014 22:39 Performed at [facility]    Colony Count NO GROWTH Performed at [facility]   Final   Culture NO GROWTH Performed at [facility]   Final   Report Status 05/18/2014 FINAL  Final  MRSA PCR Screening     Status: None   Collection Time: 05/17/14  4:53 PM  Result Value Ref Range Status   MRSA by PCR NEGATIVE NEGATIVE Final    Comment:        The GeneXpert MRSA Assay (FDA approved for NASAL specimens only), is one component of a comprehensive MRSA  colonization surveillance program. It is not intended to diagnose MRSA infection nor to guide or monitor treatment for MRSA infections.     Coagulation Studies: No results for input(s): LABPROT, INR in the last 72 hours.  Urinalysis:  Recent Labs Lab 12/16/14 1532  COLORURINE YELLOW  LABSPEC 1.010  PHURINE 6.5  GLUCOSEU NEGATIVE  HGBUR NEGATIVE  BILIRUBINUR NEGATIVE  KETONESUR NEGATIVE  PROTEINUR NEGATIVE  UROBILINOGEN 0.2  NITRITE NEGATIVE  LEUKOCYTESUR NEGATIVE    Lipid Panel:     Component Value Date/Time   CHOL 163 12/19/2014 0340   TRIG 76 12/19/2014 0340   HDL 52 12/19/2014 0340   CHOLHDL 3.1 12/19/2014 0340   VLDL 15 12/19/2014 0340   LDLCALC 96 12/19/2014 0340    HgbA1C:  Lab Results  Component Value Date   HGBA1C 6.4* 05/18/2014    Urine Drug Screen:     Component Value Date/Time   LABOPIA POSITIVE* 11/27/2014 1835   COCAINSCRNUR NONE DETECTED 11/27/2014 1835   LABBENZ NONE DETECTED 11/27/2014 1835   AMPHETMU NONE DETECTED 11/27/2014 1835   THCU NONE DETECTED 11/27/2014 1835   LABBARB NONE DETECTED 11/27/2014 1835    Alcohol Level: No results for input(s): ETH in the last 168 hours.  Other results:  Imaging: Mr Shirlee Latch Wo Contrast  12/18/2014   CLINICAL DATA:  Recent fall at home without head injury. No reported loss of consciousness.  EXAM: MRI HEAD WITHOUT CONTRAST  MRA HEAD WITHOUT CONTRAST  TECHNIQUE: Multiplanar, multiecho pulse sequences of the brain and surrounding structures were obtained without intravenous contrast. Angiographic images of the head were obtained using MRA technique without contrast.  COMPARISON:  Head CT 12/16/2014 and MRI 09/18/2010  FINDINGS: MRI HEAD FINDINGS  The examination had to be discontinued prior to  completion due to patient's inability to tolerate being in the scanner any longer and inability to remain motionless. Patient declined further imaging and medication for anxiolysis. Axial and coronal diffusion,  sagittal T1, and axial T2 weighted images were obtained.  Anterior right temporal lobe is obscured on diffusion-weighted imaging due to susceptibility artifact. There are multiple patchy areas of acute infarction in the right MCA territory involving cortex and white matter of the posterior frontal lobe, parietal lobe, posterior temporal lobe, and insula/external capsule. There is associated mild cytotoxic edema without mass effect. No parenchymal hematoma is seen.  There is no evidence of mass, midline shift, or extra-axial fluid collection. Ventricles and sulci are within normal limits for age. Patchy and confluent T2 hyperintensities in the subcortical and deep cerebral white matter and in the pons are nonspecific but compatible with rather extensive chronic small vessel ischemic disease. Chronic ischemic changes are also noted in the thalami. Chronic lacunar infarcts are present in the centrum semiovale bilaterally and in the anterior limb of the left internal capsule.  Prior bilateral cataract extraction is noted. Small left sphenoid sinus mucous retention cyst is noted. Mastoid air cells are clear. Major intracranial vascular flow voids are preserved.  MRA HEAD FINDINGS  Images are mildly to moderately motion degraded.  The visualized distal vertebral arteries are patent and codominant. PICA and SCA origins are patent. Basilar artery is patent and mildly tortuous without evidence of significant stenosis. Posterior communicating arteries are not clearly identified. P1 and proximal P2 segments are patent bilaterally without evidence of significant stenosis. More distal PCAs are not well evaluated.  Intracranial internal carotid arteries are patent without evidence of significant stenosis. ACAs are patent without evidence of proximal stenosis. M1 segments are patent without stenosis. M2 trunks are patent bilaterally. Evaluation of MCA branch vessels is limited due to degraded image quality, with suggestion of a  slightly diminished number of more distal MCA branches on the left compared to the right. No intracranial aneurysm is identified.  IMPRESSION: 1. Motion degraded, incomplete examination as above. 2. Patchy right MCA territory acute infarcts. No evidence of hemorrhage. 3. Advanced chronic small vessel ischemic disease. 4. No evidence of major intracranial arterial occlusion or significant proximal stenosis. Suboptimal branch vessel evaluation.   Electronically Signed   By: Sebastian Ache   On: 12/18/2014 10:00   Mr Brain Wo Contrast  12/18/2014   CLINICAL DATA:  Recent fall at home without head injury. No reported loss of consciousness.  EXAM: MRI HEAD WITHOUT CONTRAST  MRA HEAD WITHOUT CONTRAST  TECHNIQUE: Multiplanar, multiecho pulse sequences of the brain and surrounding structures were obtained without intravenous contrast. Angiographic images of the head were obtained using MRA technique without contrast.  COMPARISON:  Head CT 12/16/2014 and MRI 09/18/2010  FINDINGS: MRI HEAD FINDINGS  The examination had to be discontinued prior to completion due to patient's inability to tolerate being in the scanner any longer and inability to remain motionless. Patient declined further imaging and medication for anxiolysis. Axial and coronal diffusion, sagittal T1, and axial T2 weighted images were obtained.  Anterior right temporal lobe is obscured on diffusion-weighted imaging due to susceptibility artifact. There are multiple patchy areas of acute infarction in the right MCA territory involving cortex and white matter of the posterior frontal lobe, parietal lobe, posterior temporal lobe, and insula/external capsule. There is associated mild cytotoxic edema without mass effect. No parenchymal hematoma is seen.  There is no evidence of mass, midline shift, or extra-axial fluid collection. Ventricles  and sulci are within normal limits for age. Patchy and confluent T2 hyperintensities in the subcortical and deep cerebral  white matter and in the pons are nonspecific but compatible with rather extensive chronic small vessel ischemic disease. Chronic ischemic changes are also noted in the thalami. Chronic lacunar infarcts are present in the centrum semiovale bilaterally and in the anterior limb of the left internal capsule.  Prior bilateral cataract extraction is noted. Small left sphenoid sinus mucous retention cyst is noted. Mastoid air cells are clear. Major intracranial vascular flow voids are preserved.  MRA HEAD FINDINGS  Images are mildly to moderately motion degraded.  The visualized distal vertebral arteries are patent and codominant. PICA and SCA origins are patent. Basilar artery is patent and mildly tortuous without evidence of significant stenosis. Posterior communicating arteries are not clearly identified. P1 and proximal P2 segments are patent bilaterally without evidence of significant stenosis. More distal PCAs are not well evaluated.  Intracranial internal carotid arteries are patent without evidence of significant stenosis. ACAs are patent without evidence of proximal stenosis. M1 segments are patent without stenosis. M2 trunks are patent bilaterally. Evaluation of MCA branch vessels is limited due to degraded image quality, with suggestion of a slightly diminished number of more distal MCA branches on the left compared to the right. No intracranial aneurysm is identified.  IMPRESSION: 1. Motion degraded, incomplete examination as above. 2. Patchy right MCA territory acute infarcts. No evidence of hemorrhage. 3. Advanced chronic small vessel ischemic disease. 4. No evidence of major intracranial arterial occlusion or significant proximal stenosis. Suboptimal branch vessel evaluation.   Electronically Signed   By: Sebastian Ache   On: 12/18/2014 10:00   Ct Elbow Left W/o Cm  12/17/2014   CLINICAL DATA:  Fall, left elbow pain  EXAM: CT OF THE LEFT ELBOW WITHOUT CONTRAST  TECHNIQUE: Multidetector CT imaging was  performed according to the standard protocol. Multiplanar CT image reconstructions were also generated.  COMPARISON:  Forearm radiograph performed earlier today  FINDINGS: No fracture or dislocation.  No joint effusion.  IMPRESSION: Negative. No that the area of possible foreign body seen on radiograph is not included on this examination.   Electronically Signed   By: Esperanza Heir M.D.   On: 12/17/2014 21:29    Assessment: 74 y.o. female with a history of HTN, Aortic Aneurysm, Dyslipidemia, and Hypothyroid who presented to the ED with complaints of sharp intermittent Left sided chest pain and recurrent falls and syncopal episodes. MRI imaging shows a patchy R MCA infarct. Question possible embolic etiology.    Plan: 1. HgbA1c, fasting lipid panel 2. MRI, MRA  of the brain without contrast 3. PT consult, OT consult, Speech consult 4. Echocardiogram 5. Carotid dopplers 6. Prophylactic therapy-ASA 325mg  daily 7. Risk factor modification 8. Telemetry monitoring 9. Frequent neuro checks 10. NPO until RN stroke swallow screen    Elspeth Cho, DO [facility] [phone]  If 7pm- 7am, please page neurology on call as listed in [facility]ION. 12/19/2014, 4:24 PM

## 2014-12-19 NOTE — Evaluation (Signed)
Occupational Therapy Evaluation Patient Details Name: [name] [name] MRN: [id number] DOB: 12/23/1940 Today's Date: 12/19/2014    History of Present Illness [name] [name] is a 74 y.o. female with a history of HTN, Aortic Aneurysm, Dyslipidemia, and Hypothyroid who presented to the ED 12/16/14  with complaints of sharp intermittent Left sided chest pain rated at a 5/10 associated with SOB Nausea and No Vomiting or Diaphoresis today. She also had a fell today Without injury. She was seen in the ED earlier and discharged but returned after suffering a syncopal episode and was referred for medical admission. MRI revealed Right MCA infarcts.   Clinical Impression   This 74 yo female admitted and found to have above presents to acute OT with decreased balance, decreased mobility, decreased normal use of left side, lethargy, and obesity all affecting her PLOF of Mod I from RW level for BADLs as well as driving. She will benefit from acute OT with follow up OT on CIR to get to a S level or better to go home with daughter.    Follow Up Recommendations  CIR    Equipment Recommendations   (TBD next session)       Precautions / Restrictions Precautions Precautions: Fall Restrictions Weight Bearing Restrictions: No      Mobility Bed Mobility Overal bed mobility: Needs Assistance Bed Mobility: Supine to Sit     Supine to sit: Mod assist;HOB elevated (increased time as well)     General bed mobility comments: Pt problem solving rather well by using bed rail and anchoring one leg while she scooted around to sit; once on EOB and asked to her scoot forward she leaned forward to initate scoot with Min A to scoot forward  Transfers Overall transfer level: Needs assistance   Transfers: Stand Pivot Transfers;Sit to/from Stand Sit to Stand: Mod assist Stand pivot transfers: Mod assist;+2 physical assistance            Balance Overall balance assessment: Needs  assistance Sitting-balance support: Feet supported;Bilateral upper extremity supported Sitting balance-Leahy Scale: Poor Sitting balance - Comments: Sits in a flexed posture    Standing balance support: Single extremity supported Standing balance-Leahy Scale: Poor                              ADL Overall ADL's : Needs assistance/impaired Eating/Feeding: Set up;Supervision/ safety (supported sitting)   Grooming: Moderate assistance (supported sitting with increased time with task requires Bil UEs)   Upper Body Bathing: Minimal assitance (supported sitting with increased time due to decreased use of LUE)   Lower Body Bathing: Maximal assistance (with mod A sit<>stand)   Upper Body Dressing : Maximal assistance (supported sitting)   Lower Body Dressing: Total assistance (with Mod A sit<>stand)   Toilet Transfer: Moderate assistance;+2 for physical assistance;Stand-pivot (bed>recliner going to her left)   Toileting- Clothing Manipulation and Hygiene: Total assistance (with Mod A sit<>stand)               Vision Additional Comments: Needs to be further assessed when not so lethargic          Pertinent Vitals/Pain Pain Assessment: No/denies pain     Hand Dominance Right   Extremity/Trunk Assessment Upper Extremity Assessment Upper Extremity Assessment: LUE deficits/detail LUE Deficits / Details: Lags behind RUE when she raises both arms up in the air at the same time while supine. Slowness of other movements when compared to RUE, Decreased finger opposition  Communication Communication Communication: No difficulties   Cognition Arousal/Alertness: Lethargic Behavior During Therapy: Flat affect Overall Cognitive Status: Impaired/Different from baseline Area of Impairment: Following commands       Following Commands: Follows one step commands with increased time       General Comments: patient keeps eyes  closed, poorly responsive to  questions and arousal. Reports I am just so tired and I feel like I am been run over by a mac truck, backed over and run over again              Home Living Family/patient expects to be discharged to:: Inpatient rehab Living Arrangements: Children Available Help at Discharge: Family;Available 24 hours/day Type of Home: House Home Access: Level entry     Home Layout: One level     Bathroom Shower/Tub: Walk-in Pensions consultant: Handicapped height Bathroom Accessibility: Yes How Accessible: Accessible via walker Home Equipment: Walker - 2 wheels;Tub bench;Grab bars - toilet;Grab bars - tub/shower          Prior Functioning/Environment          Comments: Daughter around for S    OT Diagnosis: Generalized weakness;Cognitive deficits;Hemiplegia non-dominant side (possible vision issues)   OT Problem List: Decreased strength;Decreased range of motion;Decreased activity tolerance;Impaired balance (sitting and/or standing);Decreased safety awareness;Impaired UE functional use;Decreased cognition;Decreased coordination;Obesity;Decreased knowledge of precautions;Decreased knowledge of use of DME or AE (possible decreased vision)   OT Treatment/Interventions: Self-care/ADL training;Visual/perceptual remediation/compensation;Patient/family education;Balance training;Neuromuscular education;Therapeutic activities;Therapeutic exercise;DME and/or AE instruction    OT Goals(Current goals can be found in the care plan section) Acute Rehab OT Goals Patient Stated Goal: to get out of bed OT Goal Formulation: With patient Time For Goal Achievement: 01/02/15 Potential to Achieve Goals: Good  OT Frequency: Min 3X/week              End of Session Equipment Utilized During Treatment: Gait belt Nurse Communication: Mobility status (NT as well)  Activity Tolerance: Patient limited by fatigue;Patient limited by lethargy Patient left: in chair;with call bell/phone within  reach;with chair alarm set   Time: 1029-1053 OT Time Calculation (min): 24 min Charges:  OT General Charges $OT Visit: 1 Procedure OT Evaluation $Initial OT Evaluation Tier I: 1 Procedure OT Treatments $Self Care/Home Management : 8-22 mins  Evette Georges [phone] 12/19/2014, 11:14 AM

## 2014-12-19 NOTE — Progress Notes (Signed)
[facility] PROGRESS NOTE  [name] [name] QAE:[id number] DOB: Feb 23, 1941 DOA: 12/16/2014 PCP: Ezequiel Kayser, MD Interim summary: 74 y.o. ? h/o admission 04/2014 Polypharmacy and acute metabolic encephalopathy, hypertension, aortic aneurysm, knee replacement on the left in October 2015, non displaced obliq dist fib #, falls Body mass index is 39.29 kg/(m^2).  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 She also reported some chest pain, on admission.    Assessment/Plan: 1.  Multiple falls / Acute Stroke/ syncope: Unclear when her symptoms started Daughter says she has been having multiple falls in the past.  On aspirin 325 mg daily.  Neurology consult appreciated PT/OT consulted and recommend CIR -I am not sure she can sustain ~ 3 hours of therapy wbut will ask for consult Echocardiogram and carotid duplex ordered-pending Lipid panel =LDL 96-continue Pravachol 40 ddaily hgba1c pending Serial troponins are negative.   Risk aspiration -patient noted by RN to have some risk of aspiration as coughing SLP evaluated her and rec Dys 3 diet  Hypertension: Fair Controlled on losartan 100 daily, Norvasc 5 mg Might add HCTZ in am  Hypothyroidism: Resume synthroid 200 daily  Chronic pain syndrome: Resume home medications gabapentin. Baclofen 10 bid, . Held clonazepam as she remained lethargic.   Bipolar-Neuropathy Continue Wellbutrin XL 300 q 24 Prozac 40 daily  Acute bronchitis: Started on levaquin.   Code Status: full code.  Family Communication:no family at bedsdie Disposition Plan: transfer to [facility].    Consultants:  neurology  Procedures:  MRI brain  Antibiotics:  levaquin for bronchitis d/c 12/19/14  HPI/Subjective:  Sleepy but rousable oriented No other issues Doesn't have her BIpap NO cp n v sob doe  Objective: Filed Vitals:   12/19/14 1015  BP:  140/88  Pulse: 75  Temp: 98.1 F (36.7 C)  Resp: 18   No intake or output data in the 24 hours ending 12/19/14 1118 Filed Weights   12/17/14 0055  Weight: 107.1 kg (236 lb 1.8 oz)    Exam:   General:  Arousable, but answering questions appropriately.   Cardiovascular: s1s2, no m/r/g,   Respiratory: diminished at bases, no wheezing or rhonchi  Abdomen: soft obese, non tender non distended bowel sounds heard  Musculoskeletal: no cyanosis or edema. Tenderness in the left elbow.   Neuro:lethargic, but arousable and answering questions appropriately. Able to move all extremities.   Did not test gait as she is very unsteady at baseline.   Data Reviewed: Basic Metabolic Panel:  Recent Labs Lab 12/16/14 1546 12/17/14 0244  NA 139 140  K 4.8 3.9  CL 103 105  CO2 27 26  GLUCOSE 102* 121*  BUN 24* 21*  CREATININE 1.01* 0.91  CALCIUM 9.2 9.1   Liver Function Tests:  Recent Labs Lab 12/16/14 1546  AST 123*  ALT 81*  ALKPHOS 71  BILITOT 1.8*  PROT 7.4  ALBUMIN 3.9    Recent Labs Lab 12/16/14 1546  LIPASE 14*   No results for input(s): AMMONIA in the last 168 hours. CBC:  Recent Labs Lab 12/16/14 1622 12/17/14 0244  WBC 13.9* 11.1*  NEUTROABS 9.8*  --   HGB 15.1* 14.8  HCT 46.3* 44.1  MCV 89.7 90.2  PLT 322 313   Cardiac Enzymes:  Recent Labs Lab 12/16/14 1546 12/17/14 0244 12/17/14 0825 12/17/14 1433  TROPONINI <0.03 <0.03 <0.03 <0.03   BNP (last 3 results)  Recent Labs  11/27/14 1827  BNP 38.8    ProBNP (last 3 results) No results for input(s): PROBNP in the last 8760 hours.  CBG:  Recent Labs Lab 12/16/14 2107  GLUCAP 135*    No results found for this or any previous visit (from the past 240 hour(s)).   Studies: Dg Forearm Left  12/17/2014   CLINICAL DATA:  Larey Seat yesterday.  Pain in the left elbow.  EXAM: LEFT FOREARM - 2 VIEW  COMPARISON:  None.  FINDINGS: Radiopaque density is identified along the anteromedial aspect  of the forearm and may represent foreign body or object external to the patient. There is no evidence for acute fracture or dislocation. No soft tissue gas.  IMPRESSION: Possible foreign body in the soft tissues of the forearm.  No fracture.  If there is focal pain in the elbow consider dedicated views of the elbow.   Electronically Signed   By: Norva Pavlov M.D.   On: 12/17/2014 14:45   Mr Maxine Glenn Head Wo Contrast  12/18/2014   CLINICAL DATA:  Recent fall at home without head injury. No reported loss of consciousness.  EXAM: MRI HEAD WITHOUT CONTRAST  MRA HEAD WITHOUT CONTRAST  TECHNIQUE: Multiplanar, multiecho pulse sequences of the brain and surrounding structures were obtained without intravenous contrast. Angiographic images of the head were obtained using MRA technique without contrast.  COMPARISON:  Head CT 12/16/2014 and MRI 09/18/2010  FINDINGS: MRI HEAD FINDINGS  The examination had to be discontinued prior to completion due to patient's inability to tolerate being in the scanner any longer and inability to remain motionless. Patient declined further imaging and medication for anxiolysis. Axial and coronal diffusion, sagittal T1, and axial T2 weighted images were obtained.  Anterior right temporal lobe is obscured on diffusion-weighted imaging due to susceptibility artifact. There are multiple patchy areas of acute infarction in the right MCA territory involving cortex and white matter of the posterior frontal lobe, parietal lobe, posterior temporal lobe, and insula/external capsule. There is associated mild cytotoxic edema without mass effect. No parenchymal hematoma is seen.  There is no evidence of mass, midline shift, or extra-axial fluid collection. Ventricles and sulci are within normal limits for age. Patchy and confluent T2 hyperintensities in the subcortical and deep cerebral white matter and in the pons are nonspecific but compatible with rather extensive chronic small vessel ischemic disease.  Chronic ischemic changes are also noted in the thalami. Chronic lacunar infarcts are present in the centrum semiovale bilaterally and in the anterior limb of the left internal capsule.  Prior bilateral cataract extraction is noted. Small left sphenoid sinus mucous retention cyst is noted. Mastoid air cells are clear. Major intracranial vascular flow voids are preserved.  MRA HEAD FINDINGS  Images are mildly to moderately motion degraded.  The visualized distal vertebral arteries are patent and codominant. PICA and SCA origins are patent. Basilar artery is patent and mildly tortuous without evidence of significant stenosis. Posterior communicating arteries are not clearly identified. P1 and proximal P2 segments are patent bilaterally without evidence of significant stenosis. More distal PCAs are not well evaluated.  Intracranial internal carotid arteries are patent without evidence of significant stenosis. ACAs are patent without evidence of proximal stenosis. M1 segments are patent without stenosis. M2 trunks are patent bilaterally. Evaluation of MCA branch vessels is limited due to degraded image quality, with suggestion of a slightly diminished number of more distal MCA branches on the left compared to the right. No intracranial aneurysm is identified.  IMPRESSION: 1. Motion degraded, incomplete examination  as above. 2. Patchy right MCA territory acute infarcts. No evidence of hemorrhage. 3. Advanced chronic small vessel ischemic disease. 4. No evidence of major intracranial arterial occlusion or significant proximal stenosis. Suboptimal branch vessel evaluation.   Electronically Signed   By: Sebastian Ache   On: 12/18/2014 10:00   Mr Brain Wo Contrast  12/18/2014   CLINICAL DATA:  Recent fall at home without head injury. No reported loss of consciousness.  EXAM: MRI HEAD WITHOUT CONTRAST  MRA HEAD WITHOUT CONTRAST  TECHNIQUE: Multiplanar, multiecho pulse sequences of the brain and surrounding structures were  obtained without intravenous contrast. Angiographic images of the head were obtained using MRA technique without contrast.  COMPARISON:  Head CT 12/16/2014 and MRI 09/18/2010  FINDINGS: MRI HEAD FINDINGS  The examination had to be discontinued prior to completion due to patient's inability to tolerate being in the scanner any longer and inability to remain motionless. Patient declined further imaging and medication for anxiolysis. Axial and coronal diffusion, sagittal T1, and axial T2 weighted images were obtained.  Anterior right temporal lobe is obscured on diffusion-weighted imaging due to susceptibility artifact. There are multiple patchy areas of acute infarction in the right MCA territory involving cortex and white matter of the posterior frontal lobe, parietal lobe, posterior temporal lobe, and insula/external capsule. There is associated mild cytotoxic edema without mass effect. No parenchymal hematoma is seen.  There is no evidence of mass, midline shift, or extra-axial fluid collection. Ventricles and sulci are within normal limits for age. Patchy and confluent T2 hyperintensities in the subcortical and deep cerebral white matter and in the pons are nonspecific but compatible with rather extensive chronic small vessel ischemic disease. Chronic ischemic changes are also noted in the thalami. Chronic lacunar infarcts are present in the centrum semiovale bilaterally and in the anterior limb of the left internal capsule.  Prior bilateral cataract extraction is noted. Small left sphenoid sinus mucous retention cyst is noted. Mastoid air cells are clear. Major intracranial vascular flow voids are preserved.  MRA HEAD FINDINGS  Images are mildly to moderately motion degraded.  The visualized distal vertebral arteries are patent and codominant. PICA and SCA origins are patent. Basilar artery is patent and mildly tortuous without evidence of significant stenosis. Posterior communicating arteries are not clearly  identified. P1 and proximal P2 segments are patent bilaterally without evidence of significant stenosis. More distal PCAs are not well evaluated.  Intracranial internal carotid arteries are patent without evidence of significant stenosis. ACAs are patent without evidence of proximal stenosis. M1 segments are patent without stenosis. M2 trunks are patent bilaterally. Evaluation of MCA branch vessels is limited due to degraded image quality, with suggestion of a slightly diminished number of more distal MCA branches on the left compared to the right. No intracranial aneurysm is identified.  IMPRESSION: 1. Motion degraded, incomplete examination as above. 2. Patchy right MCA territory acute infarcts. No evidence of hemorrhage. 3. Advanced chronic small vessel ischemic disease. 4. No evidence of major intracranial arterial occlusion or significant proximal stenosis. Suboptimal branch vessel evaluation.   Electronically Signed   By: Sebastian Ache   On: 12/18/2014 10:00   Ct Elbow Left W/o Cm  12/17/2014   CLINICAL DATA:  Fall, left elbow pain  EXAM: CT OF THE LEFT ELBOW WITHOUT CONTRAST  TECHNIQUE: Multidetector CT imaging was performed according to the standard protocol. Multiplanar CT image reconstructions were also generated.  COMPARISON:  Forearm radiograph performed earlier today  FINDINGS: No fracture or dislocation.  No joint effusion.  IMPRESSION: Negative. No that the area of possible foreign body seen on radiograph is not included on this examination.   Electronically Signed   By: Esperanza Heir M.D.   On: 12/17/2014 21:29    Scheduled Meds: . aspirin EC  325 mg Oral Daily  . atorvastatin  20 mg Oral q1800  . buPROPion  300 mg Oral Daily  . celecoxib  200 mg Oral Daily  . cholecalciferol  4,000 Units Oral BID  . enoxaparin (LOVENOX) injection  40 mg Subcutaneous Q24H  . FLUoxetine  40 mg Oral Daily  . gabapentin  100 mg Oral 3 times per day  . levofloxacin  500 mg Oral Daily  . levothyroxine   200 mcg Oral QAC breakfast  . losartan  100 mg Oral Daily  . nicotine  7 mg Transdermal Daily  . nitroGLYCERIN  0.5 inch Topical 4 times per day  . pantoprazole  40 mg Oral Daily  . sodium chloride  3 mL Intravenous Q12H  . sodium chloride  3 mL Intravenous Q12H   Continuous Infusions: . sodium chloride 75 mL/hr at 12/18/14 8295    Principal Problem:   Chest pain Active Problems:   HTN (hypertension)   Tobacco dependence   Hypothyroidism   Syncope   Fall   Faintness   Elbow pain   Acute CVA (cerebrovascular accident)    Time spent:15 minutes  Pleas Koch, MD [facility] (P) [phone]

## 2014-12-19 NOTE — Progress Notes (Signed)
PT Cancellation Note  Patient Details Name: Aevah Soderman MRN: [id number] DOB: 1940/07/02   Cancelled Treatment:    Reason Eval/Treat Not Completed: Patient at procedure or test/unavailable. PT to return as able.   Marcene Brawn 12/19/2014, 1:41 PM   Lewis Shock, PT, DPT Pager #: [phone] Office #: [phone]

## 2014-12-19 NOTE — Progress Notes (Signed)
Bilateral carotid artery duplex completed:  1-39% ICA stenosis.  Vertebral artery flow is antegrade.     

## 2014-12-19 NOTE — Progress Notes (Signed)
Rehab Admissions Coordinator Note:  Patient was screened by Nigel Ericsson L for appropriateness for an Inpatient Acute Rehab Consult.  At this time, we are recommending Inpatient Rehab consult.  [name] [name] L 12/19/2014, 11:29 AM  I can be reached at [phone].

## 2014-12-19 NOTE — Evaluation (Signed)
Clinical/Bedside Swallow Evaluation Patient Details  Name: [name] [name] MRN: [id number] Date of Birth: [birth year]  Today's Date: 12/19/2014 Time: SLP Start Time (ACUTE ONLY): 1540 SLP Stop Time (ACUTE ONLY): 1604 SLP Time Calculation (min) (ACUTE ONLY): 24 min  Past Medical History:  Past Medical History  Diagnosis Date  . HTN (hypertension)   . Ascending aortic aneurysm   . Dyslipidemia   . Tobacco dependence   . Hypothyroidism   . Depression   . Neurogenic bladder   . Obesity   . Dog bite(E906.0)     RLL with systemic inflammatory response  . Chronic low back pain   . Anxiety   . MS (multiple sclerosis)     Followed by Dr. Sandria Manly  . High cholesterol   . Aortic aneurysm   . Complication of anesthesia   . PONV (postoperative nausea and vomiting)   . Aortic aneurysm   . Numbness     "different spots"  . Arthritis   . History of ulcer disease 30 yrs ago  . IBS (irritable bowel syndrome)   . Incontinence in female   . Obstructive sleep apnea     not currently using c pap   Past Surgical History:  Past Surgical History  Procedure Laterality Date  . Cholecystectomy    . Hysterectomy    . Nasal sinus surgery    . Left eye surgery    . Bilateral foot surgery    . Leg surgery  2006    due to fx leg   . Back surgery    . Total knee arthroplasty Left 04/11/2014    Procedure: LEFT TOTAL KNEE ARTHROPLASTY;  Surgeon: Shelda Pal, MD;  Location: [facility];  Service: Orthopedics;  Laterality: Left;   HPI:  [name] [name] is a 74 y.o. female with a history of HTN, Aortic Aneurysm, Dyslipidemia, and Hypothyroid who presented to the ED 12/16/14 with complaints of sharp intermittent left sided chest pain. She also had a fall without injury. She was seen in the ED and discharged but returned after suffering a syncopal episode and was referred for medical admission. MRI revealed Right MCA infarcts.   Assessment / Plan / Recommendation Clinical Impression  Pt has reduced awareness  of left-sided sensorimotor deficits, requiring Mod cues to manage anterior loss of solids and liquids, as well as left-sided buccal pocketing with regular textures. Immediate coughing is noted after sips of thin liquids and large sips of nectar thick liquids, likely due in part to premature spillage from reduced oral control. Prompting for small, single sips of nectar thick liquids appears effective. Will adjust diet to Dys 3 textures and nectar thick liquids. Pt will require full supervision for safe intake. Recommend MD consider cognitive-linguistic evaluation as well.    Aspiration Risk  Mild (with full supervision)    Diet Recommendation Dysphagia 3 (Mech soft);Nectar   Medication Administration: Whole meds with puree Compensations: Slow rate;Small sips/bites;Check for pocketing;Check for anterior loss    Other  Recommendations Oral Care Recommendations: Oral care BID Other Recommendations: Order thickener from pharmacy;Prohibited food (jello, ice cream, thin soups);Remove water pitcher   Follow Up Recommendations   CIR    Frequency and Duration min 2x/week  2 weeks   Pertinent Vitals/Pain n/a    SLP Swallow Goals     Swallow Study Prior Functional Status       General Other Pertinent Information: [name] [name] is a 74 y.o. female with a history of HTN, Aortic Aneurysm, Dyslipidemia, and Hypothyroid who presented  to the ED 12/16/14 with complaints of sharp intermittent left sided chest pain. She also had a fall without injury. She was seen in the ED and discharged but returned after suffering a syncopal episode and was referred for medical admission. MRI revealed Right MCA infarcts. Type of Study: Bedside swallow evaluation Diet Prior to this Study: Regular;Thin liquids Temperature Spikes Noted: No Respiratory Status: Room air History of Recent Intubation: No Behavior/Cognition: Alert;Cooperative;Pleasant mood;Requires cueing Oral Cavity - Dentition: Other (Comment)  (partial dentures on bottom) Self-Feeding Abilities: Able to feed self Patient Positioning: Upright in bed Baseline Vocal Quality: Normal    Oral/Motor/Sensory Function Overall Oral Motor/Sensory Function: Impaired Labial Strength: Reduced Labial Sensation: Reduced Facial Sensation: Reduced   Ice Chips Ice chips: Not tested   Thin Liquid Thin Liquid: Impaired Presentation: Cup;Self Fed;Straw Oral Phase Impairments: Reduced labial seal;Poor awareness of bolus Oral Phase Functional Implications: Left anterior spillage Pharyngeal  Phase Impairments: Cough - Immediate    Nectar Thick Nectar Thick Liquid: Impaired Presentation: Cup;Self Fed;Straw Oral Phase Impairments: Reduced labial seal;Poor awareness of bolus Oral phase functional implications: Left anterior spillage Pharyngeal Phase Impairments: Cough - Immediate   Honey Thick Honey Thick Liquid: Not tested   Puree Puree: Impaired Presentation: Self Fed;Spoon Oral Phase Impairments: Reduced labial seal;Poor awareness of bolus Oral Phase Functional Implications: Left anterior spillage   Solid    Solid: Impaired Presentation: Self Fed Oral Phase Impairments: Reduced labial seal;Poor awareness of bolus;Impaired anterior to posterior transit Oral Phase Functional Implications: Left anterior spillage;Left lateral sulci pocketing      Maxcine Ham, M.A. CCC-SLP [phone]  Maxcine Ham 12/19/2014,4:15 PM

## 2014-12-20 ENCOUNTER — Encounter (HOSPITAL_COMMUNITY): Payer: Self-pay | Admitting: Nurse Practitioner

## 2014-12-20 ENCOUNTER — Inpatient Hospital Stay (HOSPITAL_COMMUNITY): Payer: Medicare Other

## 2014-12-20 DIAGNOSIS — G35 Multiple sclerosis: Secondary | ICD-10-CM

## 2014-12-20 DIAGNOSIS — G8190 Hemiplegia, unspecified affecting unspecified side: Secondary | ICD-10-CM

## 2014-12-20 DIAGNOSIS — I6789 Other cerebrovascular disease: Secondary | ICD-10-CM

## 2014-12-20 DIAGNOSIS — I639 Cerebral infarction, unspecified: Secondary | ICD-10-CM

## 2014-12-20 LAB — HEMOGLOBIN A1C
HEMOGLOBIN A1C: 6.6 % — AB (ref 4.8–5.6)
Mean Plasma Glucose: 143 mg/dL

## 2014-12-20 MED ORDER — CLONAZEPAM 1 MG PO TABS
1.0000 mg | ORAL_TABLET | Freq: Once | ORAL | Status: AC
  Administered 2014-12-20: 1 mg via ORAL
  Filled 2014-12-20: qty 1

## 2014-12-20 MED ORDER — BISACODYL 10 MG RE SUPP: 10.0000 mg | Freq: Every day | RECTAL | Status: DC | PRN

## 2014-12-20 MED ORDER — METFORMIN HCL 500 MG PO TABS
500.0000 mg | ORAL_TABLET | Freq: Two times a day (BID) | ORAL | Status: DC
  Administered 2014-12-20 – 2014-12-22 (×3): 500 mg via ORAL
  Filled 2014-12-20 (×3): qty 1

## 2014-12-20 MED ORDER — LIVING WELL WITH DIABETES BOOK
Freq: Once | Status: DC
  Filled 2014-12-20: qty 1

## 2014-12-20 MED ORDER — TIOTROPIUM BROMIDE MONOHYDRATE 18 MCG IN CAPS
18.0000 ug | ORAL_CAPSULE | Freq: Every day | RESPIRATORY_TRACT | Status: DC
  Filled 2014-12-20 (×2): qty 5

## 2014-12-20 MED ORDER — CLONAZEPAM 1 MG PO TABS
1.0000 mg | ORAL_TABLET | Freq: Every day | ORAL | Status: DC
  Administered 2014-12-20 – 2014-12-21 (×2): 1 mg via ORAL
  Filled 2014-12-20 (×2): qty 1

## 2014-12-20 NOTE — Progress Notes (Signed)
Inpatient Rehabilitation  I met with [name] [name] and her daughter [name] [name] to assure their understanding of Dr. Naaman Plummer' recommendations for a slower paced rehab, i.e. SNF.  Daughter is in agreement and looks forward hearing from SW.  Pt. States she has been to 2 SNFs in the past and "would rather go home" than go to a SNF.  I have notified [name] [name], CM and left a voice mail for [name] [name], SW, requesting she follow up with the patient and daughter.  [name] [name] requests she be contacted at [phone] (home) as she plans to change her cell number tonight through cell carrier website.  I will sign off.  Please call if questions.  [name] [name] Cell [phone] Office [phone]

## 2014-12-20 NOTE — Consult Note (Signed)
ELECTROPHYSIOLOGY CONSULT NOTE  Patient ID: [name] [name] MRN: [id number], DOB/AGE: 1940-08-25   Admit date: 12/16/2014 Date of Consult: 12/20/2014  Primary Physician: Ezequiel Kayser, MD Primary Cardiologist: [place] Reason for Consultation: Cryptogenic stroke; recommendations regarding Implantable Loop Recorder  History of Present Illness  [name] [name] was admitted on 12/16/2014 with intermittent left sided chest pain and recurrent syncopal spells.  Imaging demonstrated patchy right MCA infarcts.  She has undergone workup for stroke including echocardiogram and carotid dopplers.  The patient has been monitored on telemetry which has demonstrated sinus rhythm with no arrhythmias.  Inpatient stroke work-up is to be completed with a TEE.   Her syncopal events are described as not frank syncope but as periods of dizziness where she gets diaphoretic.  These occur about once a year.  She has underlying conduction system disease with RBBB.    Echocardiogram this admission demonstrated EF 55%, mild LVH, LA 30.  Lab work is reviewed.  Prior to admission, the patient denies chest pain, shortness of breath, dizziness, palpitations. They are recovering from their stroke with plans to participate in CIR at discharge.  EP has been asked to evaluate for placement of an implantable loop recorder to monitor for atrial fibrillation.  ROS is negative except as outlined above.    Past Medical History  Diagnosis Date  . HTN (hypertension)   . Ascending aortic aneurysm   . Dyslipidemia   . Tobacco dependence   . Hypothyroidism   . Depression   . Neurogenic bladder   . Obesity   . Dog bite(E906.0)     RLL with systemic inflammatory response  . Chronic low back pain   . Anxiety   . MS (multiple sclerosis)     Followed by Dr. Sandria Manly  . Arthritis   . History of ulcer disease 30 yrs ago  . IBS (irritable bowel syndrome)   . Incontinence in female   . Obstructive sleep apnea     a. compliant with  CPAP     Surgical History:  Past Surgical History  Procedure Laterality Date  . Cholecystectomy    . Hysterectomy    . Nasal sinus surgery    . Left eye surgery    . Bilateral foot surgery    . Leg surgery  2006    due to fx leg   . Back surgery    . Total knee arthroplasty Left 04/11/2014    Procedure: LEFT TOTAL KNEE ARTHROPLASTY;  Surgeon: Shelda Pal, MD;  Location: [facility];  Service: Orthopedics;  Laterality: Left;     Prescriptions prior to admission  Medication Sig Dispense Refill Last Dose  . amLODipine (NORVASC) 5 MG tablet Take 5 mg by mouth daily.   12/15/2014 at Unknown time  . atorvastatin (LIPITOR) 20 MG tablet Take 20 mg by mouth daily.   12/15/2014 at Unknown time  . baclofen (LIORESAL) 10 MG tablet Take 10 mg by mouth 2 (two) times daily as needed for muscle spasms.   unknown  . buPROPion (WELLBUTRIN XL) 300 MG 24 hr tablet Take 1 tablet by mouth daily.    12/16/2014 at Unknown time  . Cholecalciferol (VITAMIN D) 2000 UNITS tablet Take 4,000 Units by mouth 2 (two) times daily.    12/16/2014 at Unknown time  . clonazePAM (KLONOPIN) 1 MG tablet Take 1 mg by mouth 3 (three) times daily.   12/16/2014 at Unknown time  . Cyanocobalamin (VITAMIN B 12 PO) Take 20,000-25,000 mcg by mouth every morning.  12/16/2014 at Unknown time  . diclofenac sodium (VOLTAREN) 1 % GEL Apply 2 g topically every 8 (eight) hours as needed (pain).   unknown  . diphenoxylate-atropine (LOMOTIL) 2.5-0.025 MG per tablet Take 2 tablets by mouth 4 (four) times daily as needed for diarrhea or loose stools.    unknown at Unknown time  . FLUoxetine (PROZAC) 40 MG capsule Take 40 mg by mouth every morning.    12/16/2014 at Unknown time  . gabapentin (NEURONTIN) 100 MG capsule Take 100-200 mg by mouth every 8 (eight) hours.    12/16/2014 at Unknown time  . levothyroxine (SYNTHROID, LEVOTHROID) 200 MCG tablet Take 200 mcg by mouth daily before breakfast.   12/16/2014 at Unknown time  . losartan (COZAAR) 100 MG  tablet Take 1 tablet by mouth every morning.    12/16/2014 at Unknown time  . omeprazole (PRILOSEC) 20 MG capsule Take 20 mg by mouth daily as needed (pain).    12/16/2014 at Unknown time  . pantoprazole (PROTONIX) 40 MG tablet Take 40 mg by mouth 2 (two) times daily.   12/16/2014 at Unknown time  . pravastatin (PRAVACHOL) 40 MG tablet Take 40 mg by mouth at bedtime.   12/15/2014 at Unknown time  . Umeclidinium-Vilanterol (ANORO ELLIPTA) 62.5-25 MCG/INH AEPB Inhale into the lungs daily.   12/16/2014 at Unknown time  . levofloxacin (LEVAQUIN) 500 MG tablet Take 1 tablet (500 mg total) by mouth daily. (Patient not taking: Reported on 11/27/2014) 4 tablet 0 Not Taking at Unknown time    Inpatient Medications:  . antiseptic oral rinse  7 mL Mouth Rinse BID  . aspirin EC  325 mg Oral Daily  . atorvastatin  20 mg Oral q1800  . buPROPion  300 mg Oral Daily  . celecoxib  200 mg Oral Daily  . cholecalciferol  4,000 Units Oral BID  . clonazePAM  1 mg Oral QHS  . enoxaparin (LOVENOX) injection  40 mg Subcutaneous Q24H  . FLUoxetine  40 mg Oral Daily  . gabapentin  100 mg Oral 3 times per day  . levothyroxine  200 mcg Oral QAC breakfast  . living well with diabetes book   Does not apply Once  . losartan  100 mg Oral Daily  . metFORMIN  500 mg Oral BID WC  . nicotine  7 mg Transdermal Daily  . nitroGLYCERIN  0.5 inch Topical 4 times per day  . pantoprazole  40 mg Oral Daily  . sodium chloride  3 mL Intravenous Q12H  . sodium chloride  3 mL Intravenous Q12H  . tiotropium  18 mcg Inhalation Daily    Allergies:  Allergies  Allergen Reactions  . Codeine Other (See Comments)    Real bad chest pains  . Lisinopril Cough  . Demerol Nausea Only  . Penicillins Rash and Other (See Comments)    Pt not sure "chestpains"    History   Social History  . Marital Status: Widowed    Spouse Name: N/A  . Number of Children: N/A  . Years of Education: N/A   Occupational History  . Retired Advertising account planner      Social History Main Topics  . Smoking status: Current Every Day Smoker -- 0.50 packs/day for 30 years    Types: Cigarettes  . Smokeless tobacco: Never Used  . Alcohol Use: Yes     Comment: Rare alcohol use  . Drug Use: No  . Sexual Activity: Not on file   Other Topics Concern  . Not on file  Social History Narrative     Family History  Problem Relation Age of Onset  . Cancer Father   . Emphysema Father      Physical Exam: Filed Vitals:   12/20/14 0245 12/20/14 0650 12/20/14 0933 12/20/14 1404  BP: 137/64 150/87 133/75 111/82  Pulse: 69 70 72 80  Temp: 97.8 F (36.6 C) 97.9 F (36.6 C) 97.6 F (36.4 C) 98.2 F (36.8 C)  TempSrc: Oral Oral Oral Oral  Resp: 18 20 20 20   Height:      Weight:      SpO2: 97% 95% 97% 98%    GEN- The patient is obese and lethargic appearing, alert and oriented x 3 today.   Head- normocephalic, atraumatic Eyes-  Sclera clear, conjunctiva pink Ears- hearing intact Oropharynx- clear Neck- supple, Lungs- Clear to ausculation bilaterally, normal work of breathing Heart- Regular rate and rhythm, no murmurs, rubs or gallops  GI- soft, NT, ND, + BS Extremities- no clubbing, cyanosis, 1+ edema MS- no significant deformity or atrophy Skin- no rash or lesion Psych- euthymic mood, full affect   Labs:   Lab Results  Component Value Date   WBC 11.1* 12/17/2014   HGB 14.8 12/17/2014   HCT 44.1 12/17/2014   MCV 90.2 12/17/2014   PLT 313 12/17/2014     Recent Labs Lab 12/16/14 1546 12/17/14 0244  NA 139 140  K 4.8 3.9  CL 103 105  CO2 27 26  BUN 24* 21*  CREATININE 1.01* 0.91  CALCIUM 9.2 9.1  PROT 7.4  --   BILITOT 1.8*  --   ALKPHOS 71  --   ALT 81*  --   AST 123*  --   GLUCOSE 102* 121*     Radiology/Studies:  Mr Brain Wo Contrast 12/18/2014   CLINICAL DATA:  Recent fall at home without head injury. No reported loss of consciousness.  EXAM: MRI HEAD WITHOUT CONTRAST  MRA HEAD WITHOUT CONTRAST  TECHNIQUE: Multiplanar,  multiecho pulse sequences of the brain and surrounding structures were obtained without intravenous contrast. Angiographic images of the head were obtained using MRA technique without contrast.  COMPARISON:  Head CT 12/16/2014 and MRI 09/18/2010  FINDINGS: MRI HEAD FINDINGS  The examination had to be discontinued prior to completion due to patient's inability to tolerate being in the scanner any longer and inability to remain motionless. Patient declined further imaging and medication for anxiolysis. Axial and coronal diffusion, sagittal T1, and axial T2 weighted images were obtained.  Anterior right temporal lobe is obscured on diffusion-weighted imaging due to susceptibility artifact. There are multiple patchy areas of acute infarction in the right MCA territory involving cortex and white matter of the posterior frontal lobe, parietal lobe, posterior temporal lobe, and insula/external capsule. There is associated mild cytotoxic edema without mass effect. No parenchymal hematoma is seen.  There is no evidence of mass, midline shift, or extra-axial fluid collection. Ventricles and sulci are within normal limits for age. Patchy and confluent T2 hyperintensities in the subcortical and deep cerebral white matter and in the pons are nonspecific but compatible with rather extensive chronic small vessel ischemic disease. Chronic ischemic changes are also noted in the thalami. Chronic lacunar infarcts are present in the centrum semiovale bilaterally and in the anterior limb of the left internal capsule.  Prior bilateral cataract extraction is noted. Small left sphenoid sinus mucous retention cyst is noted. Mastoid air cells are clear. Major intracranial vascular flow voids are preserved.  MRA HEAD FINDINGS  Images are  mildly to moderately motion degraded.  The visualized distal vertebral arteries are patent and codominant. PICA and SCA origins are patent. Basilar artery is patent and mildly tortuous without evidence of  significant stenosis. Posterior communicating arteries are not clearly identified. P1 and proximal P2 segments are patent bilaterally without evidence of significant stenosis. More distal PCAs are not well evaluated.  Intracranial internal carotid arteries are patent without evidence of significant stenosis. ACAs are patent without evidence of proximal stenosis. M1 segments are patent without stenosis. M2 trunks are patent bilaterally. Evaluation of MCA branch vessels is limited due to degraded image quality, with suggestion of a slightly diminished number of more distal MCA branches on the left compared to the right. No intracranial aneurysm is identified.  IMPRESSION: 1. Motion degraded, incomplete examination as above. 2. Patchy right MCA territory acute infarcts. No evidence of hemorrhage. 3. Advanced chronic small vessel ischemic disease. 4. No evidence of major intracranial arterial occlusion or significant proximal stenosis. Suboptimal branch vessel evaluation.   Electronically Signed   By: Sebastian Ache   On: 12/18/2014 10:00   Dg Chest Port 1 View 12/17/2014   CLINICAL DATA:  Fall, weakness  EXAM: PORTABLE CHEST - 1 VIEW  COMPARISON:  CT chest dated 11/27/2014  FINDINGS: Lungs are essentially clear.  No pleural effusion or pneumothorax.  Cardiomegaly.  IMPRESSION: No evidence of acute cardiopulmonary disease.   Electronically Signed   By: Charline Bills M.D.   On: 12/17/2014 00:12    12-lead ECG sinus rhythm, RBBB otherwise normal intervals All prior EKG's in EPIC reviewed with no documented atrial fibrillation  Telemetry sinus rhythm  Assessment and Plan:  1. Cryptogenic stroke The patient presents with cryptogenic stroke.  The patient has a TEE planned for this AM.  I spoke at length with the patient about monitoring for afib with an implantable loop recorder.  Risks, benefits, and alteratives to implantable loop recorder were discussed with the patient today.   At this time, the patient is  very clear in their decision to proceed with implantable loop recorder.   Wound care was reviewed with the patient (keep incision clean and dry for 3 days).  Wound check scheduled for 01/02/15 at 4PM  2.  Sleep apnea Compliance with CPAP encouraged  3.  Dizziness/pre-syncope Likely vasovagal by history LINQ will allow for evaluation of arrhythmias during episodes  Please call with questions.   Gypsy Balsam, NP 12/20/2014 5:06 PM   EP Attending  Patient seen and examined. I concur with the findings as noted by Gypsy Balsam, NP-C. The patient has had a cryptogenic stroke. TEE is planned and if no obvious cause of the stroke is determined, will plan to proceed with insertion of an ILR.   Leonia Reeves.D.

## 2014-12-20 NOTE — Care Management (Signed)
Important Message  Patient Details  Name: [name] [name] MRN: [id number] Date of Birth: Feb 11, 1941   Medicare Important Message Given:  Yes-second notification given    Orson Aloe 12/20/2014, 11:18 AM

## 2014-12-20 NOTE — Consult Note (Signed)
Physical Medicine and Rehabilitation Consult  Reason for Consult: Weakness.  Referring Physician: Dr. Mahala Menghini   HPI: [name] [name] is a 74 y.o. female with history of HTN, depression, chronic LBP, gait disorder with falls, MS, obesity who was admitted on 12/16/14 with lethargy, recurrent falls, syncopal episodes and malaise.  Cardiac enzymes and UA negative.  MRI/MRA brain done revealing patchy R-MCA territory infarcts and advanced chronic SVD. Carotid dopplers without significant ICA stenosis. 2D echo donee Patient has had lethargy and placed on dysphagia 3, nectar liquids due to pocketing as well as lethargy.  Neurology consulted and recommends ASA for secondary prevention and questions embolic etiology.  Therapy evaluations done and CIR recommended by MD and rehab team.    Review of Systems  Constitutional: Positive for malaise/fatigue.  HENT: Negative for hearing loss.   Eyes: Positive for blurred vision. Negative for double vision.  Respiratory: Positive for cough and shortness of breath. Negative for wheezing.   Cardiovascular: Negative for chest pain.  Genitourinary: Positive for urgency and frequency.  Musculoskeletal: Positive for myalgias, back pain (with RLE weakness) and falls.  Neurological: Positive for sensory change (numbness BLE) and weakness. Negative for headaches.  Psychiatric/Behavioral: Positive for depression. The patient has insomnia.     Past Medical History  Diagnosis Date  . HTN (hypertension)   . Ascending aortic aneurysm   . Dyslipidemia   . Tobacco dependence   . Hypothyroidism   . Depression   . Neurogenic bladder   . Obesity   . Dog bite(E906.0)     RLL with systemic inflammatory response  . Chronic low back pain   . Anxiety   . MS (multiple sclerosis)     Followed by Dr. Sandria Manly  . High cholesterol   . Aortic aneurysm   . Complication of anesthesia   . PONV (postoperative nausea and vomiting)   . Aortic aneurysm   . Numbness    "different spots"  . Arthritis   . History of ulcer disease 30 yrs ago  . IBS (irritable bowel syndrome)   . Incontinence in female   . Obstructive sleep apnea     not currently using c pap    Past Surgical History  Procedure Laterality Date  . Cholecystectomy    . Hysterectomy    . Nasal sinus surgery    . Left eye surgery    . Bilateral foot surgery    . Leg surgery  2006    due to fx leg   . Back surgery    . Total knee arthroplasty Left 04/11/2014    Procedure: LEFT TOTAL KNEE ARTHROPLASTY;  Surgeon: Shelda Pal, MD;  Location: [facility];  Service: Orthopedics;  Laterality: Left;    History reviewed. No pertinent family history.    Social History:  Lives with daughter ( mental issues).  Disabled since  70s due to MS.  Uses RW for mobility--wheelchair on bad days.  She reports that she has been smoking Cigarettes  --1/2 PPD.  She has a 15 pack-year smoking history. She has never used smokeless tobacco. She reports that she has a mixed drink every couple of months. She reports that she does not use illicit drugs.    Allergies  Allergen Reactions  . Codeine Other (See Comments)    Real bad chest pains  . Lisinopril Cough  . Demerol Nausea Only  . Penicillins Rash and Other (See Comments)    Pt not sure "chestpains"    Medications Prior  to Admission  Medication Sig Dispense Refill  . amLODipine (NORVASC) 5 MG tablet Take 5 mg by mouth daily.    Marland Kitchen atorvastatin (LIPITOR) 20 MG tablet Take 20 mg by mouth daily.    . baclofen (LIORESAL) 10 MG tablet Take 10 mg by mouth 2 (two) times daily as needed for muscle spasms.    Marland Kitchen buPROPion (WELLBUTRIN XL) 300 MG 24 hr tablet Take 1 tablet by mouth daily.     . Cholecalciferol (VITAMIN D) 2000 UNITS tablet Take 4,000 Units by mouth 2 (two) times daily.     . clonazePAM (KLONOPIN) 1 MG tablet Take 1 mg by mouth 3 (three) times daily.    . Cyanocobalamin (VITAMIN B 12 PO) Take 20,000-25,000 mcg by mouth every morning.    .  diclofenac sodium (VOLTAREN) 1 % GEL Apply 2 g topically every 8 (eight) hours as needed (pain).    Marland Kitchen diphenoxylate-atropine (LOMOTIL) 2.5-0.025 MG per tablet Take 2 tablets by mouth 4 (four) times daily as needed for diarrhea or loose stools.     Marland Kitchen FLUoxetine (PROZAC) 40 MG capsule Take 40 mg by mouth every morning.     . gabapentin (NEURONTIN) 100 MG capsule Take 100-200 mg by mouth every 8 (eight) hours.     Marland Kitchen levothyroxine (SYNTHROID, LEVOTHROID) 200 MCG tablet Take 200 mcg by mouth daily before breakfast.    . losartan (COZAAR) 100 MG tablet Take 1 tablet by mouth every morning.     Marland Kitchen omeprazole (PRILOSEC) 20 MG capsule Take 20 mg by mouth daily as needed (pain).     . pantoprazole (PROTONIX) 40 MG tablet Take 40 mg by mouth 2 (two) times daily.    . pravastatin (PRAVACHOL) 40 MG tablet Take 40 mg by mouth at bedtime.    Marland Kitchen Umeclidinium-Vilanterol (ANORO ELLIPTA) 62.5-25 MCG/INH AEPB Inhale into the lungs daily.    Marland Kitchen levofloxacin (LEVAQUIN) 500 MG tablet Take 1 tablet (500 mg total) by mouth daily. (Patient not taking: Reported on 11/27/2014) 4 tablet 0    Home: Home Living Family/patient expects to be discharged to:: Inpatient rehab Living Arrangements: Children Available Help at Discharge: Family, Available 24 hours/day Type of Home: House Home Access: Level entry Home Layout: One level Home Equipment: Walker - 2 wheels, Tub bench, Grab bars - toilet, Grab bars - tub/shower Additional Comments: no family available and patient unable to provide  Functional History: Prior Function Comments: Daughter around for S Functional Status:  Mobility: Bed Mobility Overal bed mobility: Needs Assistance Bed Mobility: Supine to Sit, Sit to Supine Rolling: Total assist, +2 for physical assistance, +2 for safety/equipment Sidelying to sit: Total assist, +2 for physical assistance, +2 for safety/equipment Supine to sit: Mod assist Sit to supine: Mod assist Sit to sidelying: Total assist, +2 for  physical assistance, +2 for safety/equipment General bed mobility comments: assist to for trunk elevation up to sitting and LEs back into bed, assist to scoot up in bed Transfers Overall transfer level: Needs assistance Equipment used: Rolling walker (2 wheeled) Transfers: Sit to/from Stand Sit to Stand: Mod assist Stand pivot transfers: Mod assist, +2 physical assistance General transfer comment: completed 3 stands, assist to initiate transfer and to place L hand on walker      ADL: ADL Overall ADL's : Needs assistance/impaired Eating/Feeding: Set up, Supervision/ safety (supported sitting) Grooming: Moderate assistance (supported sitting with increased time with task requires Bil UEs) Upper Body Bathing: Minimal assitance (supported sitting with increased time due to decreased use of LUE)  Lower Body Bathing: Maximal assistance (with mod A sit<>stand) Upper Body Dressing : Maximal assistance (supported sitting) Lower Body Dressing: Total assistance (with Mod A sit<>stand) Toilet Transfer: Moderate assistance, +2 for physical assistance, Stand-pivot (bed>recliner going to her left) Toileting- Clothing Manipulation and Hygiene: Total assistance (with Mod A sit<>stand)  Cognition: Cognition Overall Cognitive Status: Within Functional Limits for tasks assessed Orientation Level: Oriented X4 Cognition Arousal/Alertness: Lethargic Behavior During Therapy: Flat affect Overall Cognitive Status: Within Functional Limits for tasks assessed Area of Impairment: Following commands Following Commands: Follows one step commands with increased time General Comments: patient keeps eyes  closed, poorly responsive to questions and arousal. Reports I am just so tired and I feel like I am been run over by a mac truck, backed over and run over again  Blood pressure 133/75, pulse 72, temperature 97.6 F (36.4 C), temperature source Oral, resp. rate 20, height  (1.651 m), weight 107.1 kg (236 lb  1.8 oz), SpO2 97 %. Physical Exam  Nursing note and vitals reviewed. Constitutional: She is oriented to person, place, and time. She appears well-developed and well-nourished.  Obese female with multiple somatic complaints.   HENT:  Head: Normocephalic and atraumatic.  Eyes: Conjunctivae are normal. Pupils are equal, round, and reactive to light.  Neck: Normal range of motion. Neck supple.  Cardiovascular: Normal rate and regular rhythm.   Respiratory: No accessory muscle usage. No respiratory distress. She has decreased breath sounds. She has no wheezes. She exhibits no tenderness.  Mildly dyspneic past walk and with conversation.  Occasional congested cough.   GI: Bowel sounds are normal.  Musculoskeletal: She exhibits no edema or tenderness.  Neurological: She is alert and oriented to person, place, and time.  Speech clear. Appears to have mild left facial weakness. Follows commands without difficulty. Left sided weakness with decrease in fine motor control. Intentional tremors RUE. LUE 4/5 prox to distal with pronator drift. LLE: 3+HF, 4-KE and 4ADF/APF. Sensory 2- LUE and LLE. DTR's 1+ on left. Motor essentially 4+RUE and RLE on exam today without sensory deficits.   Skin: Skin is warm and dry.  Psychiatric: Her speech is normal. Her affect is blunt. She is slowed.    No results found for this or any previous visit (from the past 24 hour(s)). No results found.  Assessment/Plan: Diagnosis: Right MCA infarct (hx of MS), progressive falls/gait disorder over the last year+ 1. Does the need for close, 24 hr/day medical supervision in concert with the patient's rehab needs make it unreasonable for this patient to be served in a less intensive setting? No 2. Co-Morbidities requiring supervision/potential complications: htn, previous left TKA,  3. Due to bladder management, bowel management, safety, skin/wound care, disease management, medication administration, pain management and patient  education, does the patient require 24 hr/day rehab nursing? No 4. Does the patient require coordinated care of a physician, rehab nurse, PT, OT, SLP to address physical and functional deficits in the context of the above medical diagnosis(es)? No Addressing deficits in the following areas: balance, endurance, locomotion, strength, bowel/bladder control, dressing, grooming and cognition 5. Can the patient actively participate in an intensive therapy program of at least 3 hrs of therapy per day at least 5 days per week? Potentially 6. The potential for patient to make measurable gains while on inpatient rehab is fair and poor 7. Anticipated functional outcomes upon discharge from inpatient rehab are n/a  with PT, n/a with OT, n/a with SLP. 8. Estimated rehab length of stay to  reach the above functional goals is: n/a 9. Does the patient have adequate social supports and living environment to accommodate these discharge functional goals? No 10. Anticipated D/C setting: Other 11. Anticipated post D/C treatments: N/A 12. Overall Rehab/Functional Prognosis: fair  RECOMMENDATIONS: This patient's condition is appropriate for continued rehabilitative care in the following setting: SNF Patient has agreed to participate in recommended program. Potentially Note that insurance prior authorization may be required for reimbursement for recommended care.  Comment: Difficult social situation. Patient has been slowly declining prior to this admission. Recommend slower rehab approach given chronic deficits and need to determine long term dispo (?ALF)  Ranelle Oyster, MD, [facility] Physical Medicine & Rehabilitation 12/20/2014     12/20/2014

## 2014-12-20 NOTE — Progress Notes (Signed)
Follow up - for goals of care and code status  Interval - PT/OT eval done with recommendation for CIR; SLP eval done - rec. Dysphagia 3 diet and f;u MBS  Scheduled Meds:  antiseptic oral rinse  7 mL Mouth Rinse BID   aspirin EC  325 mg Oral Daily   atorvastatin  20 mg Oral q1800   buPROPion  300 mg Oral Daily   celecoxib  200 mg Oral Daily   cholecalciferol  4,000 Units Oral BID   clonazePAM  1 mg Oral QHS   enoxaparin (LOVENOX) injection  40 mg Subcutaneous Q24H   FLUoxetine  40 mg Oral Daily   gabapentin  100 mg Oral 3 times per day   levothyroxine  200 mcg Oral QAC breakfast   losartan  100 mg Oral Daily   metFORMIN  500 mg Oral BID WC   nicotine  7 mg Transdermal Daily   nitroGLYCERIN  0.5 inch Topical 4 times per day   pantoprazole  40 mg Oral Daily   sodium chloride  3 mL Intravenous Q12H   sodium chloride  3 mL Intravenous Q12H   tiotropium  18 mcg Inhalation Daily   Continuous Infusions:  sodium chloride 75 mL/hr at 12/18/14 0949   PRN Meds:.sodium chloride, acetaminophen **OR** acetaminophen, alum & mag hydroxide-simeth, HYDROmorphone (DILAUDID) injection, ondansetron **OR** ondansetron (ZOFRAN) IV, oxyCODONE, RESOURCE THICKENUP CLEAR, sodium chloride   Subjective: Mrs. Salley reports that she did not sleep well 2/2 restless legs and is now lethargic. She also reports feeling short of breath but does not c/o cough or sputum production.  Objective:  Filed Vitals:   12/20/14 0933  BP: 133/75  Pulse: 72  Temp: 97.6 F (36.4 C)  Resp: 20   Wt Readings from Last 3 Encounters:  12/17/14 107.1 kg (236 lb 1.8 oz)  11/27/14 99.338 kg (219 lb)  05/20/14 100.789 kg (222 lb 3.2 oz)   gen'l- obese white woman in no distress but appears fatigued HEENT- C&S clear Cor - quiet precordium, RRR Pulm - no increased WOB, no rales or wheezes, good air movement. Abd - obese, soft Neuro - lethargic, minimal right facial droop.     Lab: CBC Latest Ref  Rng 12/17/2014 12/16/2014 11/27/2014  WBC 4.0 - 10.5 K/uL 11.1(H) 13.9(H) 14.7(H)  Hemoglobin 12.0 - 15.0 g/dL 40.9 15.1(H) 15.2(H)  Hematocrit 36.0 - 46.0 % 44.1 46.3(H) 45.5  Platelets 150 - 400 K/uL 313 322 360    CMP Latest Ref Rng 12/17/2014 12/16/2014 11/27/2014  Glucose 65 - 99 mg/dL 811(B) 147(W) 295(A)  BUN 6 - 20 mg/dL 21(H) 08(M) 14  Creatinine 0.44 - 1.00 mg/dL 5.78 4.69(G) 2.95  Sodium 135 - 145 mmol/L 140 139 141  Potassium 3.5 - 5.1 mmol/L 3.9 4.8 3.7  Chloride 101 - 111 mmol/L 105 103 104  CO2 22 - 32 mmol/L Calcium 8.9 - 10.3 mg/dL 9.1 9.2 9.4  Total Protein 6.5 - 8.1 g/dL - 7.4 6.8  Total Bilirubin 0.3 - 1.2 mg/dL - 1.8(H) 1.0  Alkaline Phos 38 - 126 U/L - 71 81  AST 15 - 41 U/L - 123(H) 288(H)  ALT 14 - 54 U/L - 81(H) 162(H)   A1C 6.6%, TSH 0.581  Imaging: carotid doppler 12/19/14: 1-39% bilaterally  2 D echo - pending  A/P 1. 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 PT/OT eval complete with recommendation for CIR. She is on aspirin. Question of swallow difficulty - MBS pending.  Currently on dysphagia 3 diet.  Plan Continue risk reduction therapy  2. Chronic back pain - pt required single dose of OxyIR 2.5 mg at 2240 hrs. 2 doses in 24 hrs of dilaudid IV. Tolerated well. Takes gabapentin with good results. She reports chronic restless leg symptoms 2/2 spinal stenosis. At home she takes clonazepam qHS  Plan Will order clonazepam qHS   Will d/c dilaudid, continue OxyIR  3. Neck pain - still uncomfortable despite COX 2.  4. Psych - appears stable on present medication  5. Pulm - patient with dyspnea. Exam is unremarkable. CXR 6/26 was clear. She reports outpatient PFTs with subsequent initiation of treatment with Spiriva, suggestive of COPD  Plan Will order spiriva.  6. DM - A1C reflects good control.  7. Thyroid - replacement therapy ordered  8. Palliative Care - pain adequately controlled. Code status established: DNR/DNI. Disposition -  CIR recommended with intention to return home. Will have prn order for laxative.  PMT will continue to follow.  Encounter - 20 min.  Illene Regulus, MD, [facility] Palliative Care Team [phone]

## 2014-12-20 NOTE — Progress Notes (Signed)
Inpatient Diabetes Program Recommendations  AACE/ADA: New Consensus Statement on Inpatient Glycemic Control (2013)  Target Ranges:  Prepandial:   less than 140 mg/dL      Peak postprandial:   less than 180 mg/dL (1-2 hours)      Critically ill patients:  140 - 180 mg/dL   Reason for Visit: new diagnosis  Met with patient to discuss new diagnosis diabetes- is familiar with the diagnosis because her husband had diabetes.  She is familiar with an A1C- reviewed her current 6.6% and criteria for diagnosis.  Patient will discuss diagnosis and blood sugar testing needs with family doctor who she's often.  Will watch diabetes videos when she's feeling better. [facility] with Diabetes education tool.  Discussed Metformin with the patient, potential side effects, need to take it with food and to continue it after discharge even if she experiences diarrhea or gas as this is a common side effect and will likely go away in a few weeks.  Encouraged to take with food.   Gentry Fitz, RN, BA, MHA, CDE Diabetes Coordinator Inpatient Diabetes Program  [phone] (Team Pager) [phone] ([place]) 12/20/2014 4:05 PM

## 2014-12-20 NOTE — Progress Notes (Addendum)
[facility] PROGRESS NOTE  [name] [name] WUJ:[id number] DOB: 08/12/1940 DOA: 12/16/2014 PCP: Ezequiel Kayser, MD Interim summary: 74 y.o. ? h/o admission 04/2014 Polypharmacy and acute metabolic encephalopathy, hypertension, aortic aneurysm, knee replacement on the left in October 2015, non displaced obliq dist fib #, falls Body mass index is 39.29 kg/(m^2).  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 She also reported some chest pain, on admission.    Assessment/Plan: 1.  Multiple falls / Acute Stroke/ syncope: Unclear when her symptoms started Daughter says she has been having multiple falls in the past.  On aspirin 325 mg daily.  Neurology consult appreciated PT/OT consulted and recommend CIR -I am not sure she can sustain ~ 3 hours of therapy but will ask for consult Echocardiogram still pending carotid duplex ordered-neg Lipid panel =LDL 96-continue Pravachol 40 ddaily hgba1c-6.6 Serial troponins are negative.   Risk aspiration -patient noted by RN to have some risk of aspiration as coughing -SLP evaluated her and rec Dys 3 diet  Hypertension: Fair Controlled on losartan 100 daily, Norvasc 5 mg Might add HCTZ in am  DM ty 2-new onset -start metformin 500 bid -CBG ACHS  Hypothyroidism: Resume synthroid 200 daily  Chronic pain syndrome: Resume home medications gabapentin. Baclofen 10 bid, . Held clonazepam as she remained lethargic but this is now imporved  Bipolar-Neuropathy Continue Wellbutrin XL 300 q 24 Prozac 40 daily  Acute bronchitis: Started on levaquin.   Code Status: full code.  Family Communication:no family at bedsdie Disposition Plan: transfer to [facility].    Consultants:  neurology  Procedures:  MRI brain  Antibiotics:  levaquin for bronchitis d/c 12/19/14  HPI/Subjective:  Well More alert Shocked that she might have DM No cp no  sob Feels weak overall  Objective: Filed Vitals:   12/20/14 0933  BP: 133/75  Pulse: 72  Temp: 97.6 F (36.4 C)  Resp: 20    Intake/Output Summary (Last 24 hours) at 12/20/14 1049 Last data filed at 12/19/14 1900  Gross per 24 hour  Intake      0 ml  Output    300 ml  Net   -300 ml   Filed Weights   12/17/14 0055  Weight: 107.1 kg (236 lb 1.8 oz)    Exam:   General:  Arousable, but answering questions appropriately.   Cardiovascular: s1s2, no m/r/g,   Respiratory: diminished at bases, no wheezing or rhonchi  Abdomen: soft obese, non tender non distended bowel sounds heard  Musculoskeletal: no cyanosis or edema. Tenderness in the left elbow.   Neuro: but arousable and answering questions appropriately. Able to move all extremities. Finger nose finge ron R is abniormal  Smile is abnormal speech is clear she has drooping to l sideof face.   Did not test gait as she is very unsteady at baseline.   Data Reviewed: Basic Metabolic Panel:  Recent Labs Lab 12/16/14 1546 12/17/14 0244  NA 139 140  K 4.8 3.9  CL 103 105  CO2 27 26  GLUCOSE 102* 121*  BUN 24* 21*  CREATININE 1.01* 0.91  CALCIUM 9.2 9.1   Liver Function Tests:  Recent Labs Lab 12/16/14 1546  AST 123*  ALT 81*  ALKPHOS 71  BILITOT 1.8*  PROT 7.4  ALBUMIN 3.9    Recent Labs Lab 12/16/14 1546  LIPASE 14*   No results for input(s): AMMONIA in the last 168 hours. CBC:  Recent  Labs Lab 12/16/14 1622 12/17/14 0244  WBC 13.9* 11.1*  NEUTROABS 9.8*  --   HGB 15.1* 14.8  HCT 46.3* 44.1  MCV 89.7 90.2  PLT 322 313   Cardiac Enzymes:  Recent Labs Lab 12/16/14 1546 12/17/14 0244 12/17/14 0825 12/17/14 1433  TROPONINI <0.03 <0.03 <0.03 <0.03   BNP (last 3 results)  Recent Labs  11/27/14 1827  BNP 38.8    ProBNP (last 3 results) No results for input(s): PROBNP in the last 8760 hours.  CBG:  Recent Labs Lab 12/16/14 2107  GLUCAP 135*    No results found for  this or any previous visit (from the past 240 hour(s)).   Studies: No results found.  Scheduled Meds: . antiseptic oral rinse  7 mL Mouth Rinse BID  . aspirin EC  325 mg Oral Daily  . atorvastatin  20 mg Oral q1800  . buPROPion  300 mg Oral Daily  . celecoxib  200 mg Oral Daily  . cholecalciferol  4,000 Units Oral BID  . enoxaparin (LOVENOX) injection  40 mg Subcutaneous Q24H  . FLUoxetine  40 mg Oral Daily  . gabapentin  100 mg Oral 3 times per day  . levothyroxine  200 mcg Oral QAC breakfast  . losartan  100 mg Oral Daily  . nicotine  7 mg Transdermal Daily  . nitroGLYCERIN  0.5 inch Topical 4 times per day  . pantoprazole  40 mg Oral Daily  . sodium chloride  3 mL Intravenous Q12H  . sodium chloride  3 mL Intravenous Q12H   Continuous Infusions: . sodium chloride 75 mL/hr at 12/18/14 2094    Principal Problem:   Acute CVA (cerebrovascular accident) Active Problems:   HTN (hypertension)   Tobacco dependence   Hypothyroidism   Chest pain   Syncope   Fall   Faintness   Elbow pain   Pain in the chest   Stroke    Time spent:15 minutes  Call daughter on Local number  Pleas Koch, MD [facility] (P) [phone]

## 2014-12-20 NOTE — Progress Notes (Signed)
Echocardiogram 2D Echocardiogram has been performed.  [name] [name] 12/20/2014, 12:53 PM

## 2014-12-20 NOTE — Progress Notes (Signed)
STROKE TEAM PROGRESS NOTE   SUBJECTIVE (INTERVAL HISTORY) No family is at the bedside.  Overall [name] [name] feels her condition is stable. [name] [name] has a lot of questions related to her abd/chest issues.   OBJECTIVE Temp:  [97.6 F (36.4 C)-98.9 F (37.2 C)] 97.6 F (36.4 C) (06/28 0933) Pulse Rate:  [69-78] 72 (06/28 0933) Cardiac Rhythm:  [-] Normal sinus rhythm (06/28 0410) Resp:  [18-20] 20 (06/28 0933) BP: (100-150)/(64-87) 133/75 mmHg (06/28 0933) SpO2:  [95 %-97 %] 97 % (06/28 0933)   Recent Labs Lab 12/16/14 2107  GLUCAP 135*    Recent Labs Lab 12/16/14 1546 12/17/14 0244  NA 139 140  K 4.8 3.9  CL 103 105  CO2 27 26  GLUCOSE 102* 121*  BUN 24* 21*  CREATININE 1.01* 0.91  CALCIUM 9.2 9.1    Recent Labs Lab 12/16/14 1546  AST 123*  ALT 81*  ALKPHOS 71  BILITOT 1.8*  PROT 7.4  ALBUMIN 3.9    Recent Labs Lab 12/16/14 1622 12/17/14 0244  WBC 13.9* 11.1*  NEUTROABS 9.8*  --   HGB 15.1* 14.8  HCT 46.3* 44.1  MCV 89.7 90.2  PLT 322 313    Recent Labs Lab 12/16/14 1546 12/17/14 0244 12/17/14 0825 12/17/14 1433  TROPONINI <0.03 <0.03 <0.03 <0.03   No results for input(s): LABPROT, INR in the last 72 hours. No results for input(s): COLORURINE, LABSPEC, PHURINE, GLUCOSEU, HGBUR, BILIRUBINUR, KETONESUR, PROTEINUR, UROBILINOGEN, NITRITE, LEUKOCYTESUR in the last 72 hours.  Invalid input(s): APPERANCEUR     Component Value Date/Time   CHOL 163 12/19/2014 0340   TRIG 76 12/19/2014 0340   HDL 52 12/19/2014 0340   CHOLHDL 3.1 12/19/2014 0340   VLDL 15 12/19/2014 0340   LDLCALC 96 12/19/2014 0340   Lab Results  Component Value Date   HGBA1C 6.6* 12/19/2014      Component Value Date/Time   LABOPIA POSITIVE* 11/27/2014 1835   COCAINSCRNUR NONE DETECTED 11/27/2014 1835   LABBENZ NONE DETECTED 11/27/2014 1835   AMPHETMU NONE DETECTED 11/27/2014 1835   THCU NONE DETECTED 11/27/2014 1835   LABBARB NONE DETECTED 11/27/2014 1835    No results for  input(s): ETH in the last 168 hours.  Dg Chest 2 View  11/27/2014   CLINICAL DATA:  Mid chest pain and left arm pain.  EXAM: CHEST  2 VIEW  COMPARISON:  05/17/2014 and 04/05/2014  FINDINGS: The heart size and pulmonary vascularity are normal and the lungs are clear. Tortuosity and calcification of thoracic aorta. No osseous abnormality.  IMPRESSION: No acute abnormality.  No change since the prior exams.   Electronically Signed   By: Francene Boyers M.D.   On: 11/27/2014 19:19   Dg Forearm Left  12/17/2014   CLINICAL DATA:  Larey Seat yesterday.  Pain in the left elbow.  EXAM: LEFT FOREARM - 2 VIEW  COMPARISON:  None.  FINDINGS: Radiopaque density is identified along the anteromedial aspect of the forearm and may represent foreign body or object external to the patient. There is no evidence for acute fracture or dislocation. No soft tissue gas.  IMPRESSION: Possible foreign body in the soft tissues of the forearm.  No fracture.  If there is focal pain in the elbow consider dedicated views of the elbow.   Electronically Signed   By: Norva Pavlov M.D.   On: 12/17/2014 14:45   Ct Head Wo Contrast  12/16/2014   CLINICAL DATA:  Status post fall, generalized weakness  EXAM: CT HEAD WITHOUT CONTRAST  TECHNIQUE: Contiguous axial images were obtained from the base of the skull through the vertex without intravenous contrast.  COMPARISON:  05/17/2014  FINDINGS: No evidence of parenchymal hemorrhage or extra-axial fluid collection. No mass lesion, mass effect, or midline shift.  No CT evidence of acute infarction.  Subcortical white matter and periventricular small vessel ischemic changes. Mild intracranial atherosclerosis.  Cerebral volume is within normal limits.  No ventriculomegaly.  The visualized paranasal sinuses are essentially clear. The mastoid air cells are unopacified.  No evidence of calvarial fracture.  IMPRESSION: No evidence of acute intracranial abnormality.  Small vessel ischemic changes.    Electronically Signed   By: Charline Bills M.D.   On: 12/16/2014 23:15   Ct Chest W Contrast  11/27/2014   CLINICAL DATA:  Mid chest pain, shortness of breath, nausea and abdominal pain. Symptoms for 1 day.  EXAM: CT CHEST, ABDOMEN, AND PELVIS WITH CONTRAST  TECHNIQUE: Multidetector CT imaging of the chest, abdomen and pelvis was performed following the standard protocol during bolus administration of intravenous contrast.  CONTRAST:  OMNIPAQUE IOHEXOL 300 MG/ML  SOLN  COMPARISON:  Chest radiographs earlier this day. CT 07/17/2012  FINDINGS: CT CHEST FINDINGS  There is prominence of the ascending aorta, maximal dimension, this is unchanged from prior exam. Transverse and descending thoracic aorta are normal in caliber. There is moderate atherosclerotic calcification of is unchanged. No dissection or para-aortic stranding. The heart size is normal. There are coronary artery calcifications. No filling defects in the central pulmonary arteries. There is no mediastinal or hilar adenopathy. No pleural or pericardial effusion. Lipomatous hypertrophy at the intra-atrial septum is noted.  Trace hypoventilatory change at the lung bases. No consolidation to suggest pneumonia. No pulmonary nodule or mass. The right lower lobe 4 mm nodule on prior exam is not seen.  There is an 8 mm nodule in the upper right breast. Retroareolar nodular density is unchanged from prior CT. No axillary adenopathy.  There are no acute or suspicious osseous abnormalities.  CT ABDOMEN AND PELVIS FINDINGS  Postcholecystectomy. There is biliary prominence, the common bile duct measures 2.1 cm in greatest dimension. There is intrahepatic biliary ductal dilatation, left greater than right. No calcified choledocholithiasis. There is no focal hepatic lesion. 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 Mild pancreatic atrophy without ductal dilatation or peripancreatic inflammatory  change. 5 mm hypodensity in the upper spleen, unchanged from prior. The adrenal glands are normal. Kidneys demonstrate symmetric enhancement and excretion without hydronephrosis or perinephric stranding. Simple cyst in the lower right kidney is again seen.  The stomach is physiologically distended. There are no dilated or thickened bowel loops. The appendix is not definitively identified, no inflammatory change in the right lower quadrant to suggest appendicitis. There is a moderate volume of colonic stool without colonic wall thickening. Mild diverticulosis of the distal colon without diverticulitis. No free air, free fluid, or intra-abdominal fluid collection.  No retroperitoneal adenopathy. Abdominal aorta is normal in caliber. There is moderate diffuse atherosclerosis of the abdominal aorta and its branches. No aneurysm.  Within the pelvis the urinary bladder is near completely decompressed, question of bladder wall thickening. Uterus is surgically absent. There is no adnexal mass. There is no pelvic free fluid.  There are no acute or suspicious osseous abnormalities. There is postsurgical change in the lower lumbar spine.  IMPRESSION: 1. No acute intrathoracic abnormality. 2. Progressive dilatation of the extrahepatic biliary tree postcholecystectomy. No  calcified choledocholithiasis. Correlation with LFTs recommended. If there is clinical concern for biliary obstruction, MRCP could be considered. 3. Question of bladder wall thickening versus nondistention. Correlation with urinalysis recommended. 4. There is unchanged prominence of the ascending thoracic aorta, 3.9 cm. This is unchanged over the course of 2 years. 5. Right breast 8 mm nodule centrally. Recommend up-to-date mammography. 6. Diverticulosis without diverticulitis. Unchanged common hepatic artery aneurysm.   Electronically Signed   By: Rubye Oaks M.D.   On: 11/27/2014 21:49   Mr Maxine Glenn Head Wo Contrast  12/18/2014   CLINICAL DATA:  Recent  fall at home without head injury. No reported loss of consciousness.  EXAM: MRI HEAD WITHOUT CONTRAST  MRA HEAD WITHOUT CONTRAST  TECHNIQUE: Multiplanar, multiecho pulse sequences of the brain and surrounding structures were obtained without intravenous contrast. Angiographic images of the head were obtained using MRA technique without contrast.  COMPARISON:  Head CT 12/16/2014 and MRI 09/18/2010  FINDINGS: MRI HEAD FINDINGS  The examination had to be discontinued prior to completion due to patient's inability to tolerate being in the scanner any longer and inability to remain motionless. Patient declined further imaging and medication for anxiolysis. Axial and coronal diffusion, sagittal T1, and axial T2 weighted images were obtained.  Anterior right temporal lobe is obscured on diffusion-weighted imaging due to susceptibility artifact. There are multiple patchy areas of acute infarction in the right MCA territory involving cortex and white matter of the posterior frontal lobe, parietal lobe, posterior temporal lobe, and insula/external capsule. There is associated mild cytotoxic edema without mass effect. No parenchymal hematoma is seen.  There is no evidence of mass, midline shift, or extra-axial fluid collection. Ventricles and sulci are within normal limits for age. Patchy and confluent T2 hyperintensities in the subcortical and deep cerebral white matter and in the pons are nonspecific but compatible with rather extensive chronic small vessel ischemic disease. Chronic ischemic changes are also noted in the thalami. Chronic lacunar infarcts are present in the centrum semiovale bilaterally and in the anterior limb of the left internal capsule.  Prior bilateral cataract extraction is noted. Small left sphenoid sinus mucous retention cyst is noted. Mastoid air cells are clear. Major intracranial vascular flow voids are preserved.  MRA HEAD FINDINGS  Images are mildly to moderately motion degraded.  The visualized  distal vertebral arteries are patent and codominant. PICA and SCA origins are patent. Basilar artery is patent and mildly tortuous without evidence of significant stenosis. Posterior communicating arteries are not clearly identified. P1 and proximal P2 segments are patent bilaterally without evidence of significant stenosis. More distal PCAs are not well evaluated.  Intracranial internal carotid arteries are patent without evidence of significant stenosis. ACAs are patent without evidence of proximal stenosis. M1 segments are patent without stenosis. M2 trunks are patent bilaterally. Evaluation of MCA branch vessels is limited due to degraded image quality, with suggestion of a slightly diminished number of more distal MCA branches on the left compared to the right. No intracranial aneurysm is identified.  IMPRESSION: 1. Motion degraded, incomplete examination as above. 2. Patchy right MCA territory acute infarcts. No evidence of hemorrhage. 3. Advanced chronic small vessel ischemic disease. 4. No evidence of major intracranial arterial occlusion or significant proximal stenosis. Suboptimal branch vessel evaluation.   Electronically Signed   By: Sebastian Ache   On: 12/18/2014 10:00   Mr Brain Wo Contrast  12/18/2014   CLINICAL DATA:  Recent fall at home without head injury. No reported loss of consciousness.  EXAM: MRI HEAD WITHOUT CONTRAST  MRA HEAD WITHOUT CONTRAST  TECHNIQUE: Multiplanar, multiecho pulse sequences of the brain and surrounding structures were obtained without intravenous contrast. Angiographic images of the head were obtained using MRA technique without contrast.  COMPARISON:  Head CT 12/16/2014 and MRI 09/18/2010  FINDINGS: MRI HEAD FINDINGS  The examination had to be discontinued prior to completion due to patient's inability to tolerate being in the scanner any longer and inability to remain motionless. Patient declined further imaging and medication for anxiolysis. Axial and coronal  diffusion, sagittal T1, and axial T2 weighted images were obtained.  Anterior right temporal lobe is obscured on diffusion-weighted imaging due to susceptibility artifact. There are multiple patchy areas of acute infarction in the right MCA territory involving cortex and white matter of the posterior frontal lobe, parietal lobe, posterior temporal lobe, and insula/external capsule. There is associated mild cytotoxic edema without mass effect. No parenchymal hematoma is seen.  There is no evidence of mass, midline shift, or extra-axial fluid collection. Ventricles and sulci are within normal limits for age. Patchy and confluent T2 hyperintensities in the subcortical and deep cerebral white matter and in the pons are nonspecific but compatible with rather extensive chronic small vessel ischemic disease. Chronic ischemic changes are also noted in the thalami. Chronic lacunar infarcts are present in the centrum semiovale bilaterally and in the anterior limb of the left internal capsule.  Prior bilateral cataract extraction is noted. Small left sphenoid sinus mucous retention cyst is noted. Mastoid air cells are clear. Major intracranial vascular flow voids are preserved.  MRA HEAD FINDINGS  Images are mildly to moderately motion degraded.  The visualized distal vertebral arteries are patent and codominant. PICA and SCA origins are patent. Basilar artery is patent and mildly tortuous without evidence of significant stenosis. Posterior communicating arteries are not clearly identified. P1 and proximal P2 segments are patent bilaterally without evidence of significant stenosis. More distal PCAs are not well evaluated.  Intracranial internal carotid arteries are patent without evidence of significant stenosis. ACAs are patent without evidence of proximal stenosis. M1 segments are patent without stenosis. M2 trunks are patent bilaterally. Evaluation of MCA branch vessels is limited due to degraded image quality, with  suggestion of a slightly diminished number of more distal MCA branches on the left compared to the right. No intracranial aneurysm is identified.  IMPRESSION: 1. Motion degraded, incomplete examination as above. 2. Patchy right MCA territory acute infarcts. No evidence of hemorrhage. 3. Advanced chronic small vessel ischemic disease. 4. No evidence of major intracranial arterial occlusion or significant proximal stenosis. Suboptimal branch vessel evaluation.   Electronically Signed   By: Sebastian Ache   On: 12/18/2014 10:00   Ct Abdomen Pelvis W Contrast  11/27/2014   CLINICAL DATA:  Mid chest pain, shortness of breath, nausea and abdominal pain. Symptoms for 1 day.  EXAM: CT CHEST, ABDOMEN, AND PELVIS WITH CONTRAST  TECHNIQUE: Multidetector CT imaging of the chest, abdomen and pelvis was performed following the standard protocol during bolus administration of intravenous contrast.  CONTRAST:  OMNIPAQUE IOHEXOL 300 MG/ML  SOLN  COMPARISON:  Chest radiographs earlier this day. CT 07/17/2012  FINDINGS: CT CHEST FINDINGS  There is prominence of the ascending aorta, maximal dimension, this is unchanged from prior exam. Transverse and descending thoracic aorta are normal in caliber. There is moderate atherosclerotic calcification of is unchanged. No dissection or para-aortic stranding. The heart size is normal. There are coronary artery calcifications. No filling defects in the  central pulmonary arteries. There is no mediastinal or hilar adenopathy. No pleural or pericardial effusion. Lipomatous hypertrophy at the intra-atrial septum is noted.  Trace hypoventilatory change at the lung bases. No consolidation to suggest pneumonia. No pulmonary nodule or mass. The right lower lobe 4 mm nodule on prior exam is not seen.  There is an 8 mm nodule in the upper right breast. Retroareolar nodular density is unchanged from prior CT. No axillary adenopathy.  There are no acute or suspicious osseous abnormalities.  CT  ABDOMEN AND PELVIS FINDINGS  Postcholecystectomy. There is biliary prominence, the common bile duct measures 2.1 cm in greatest dimension. There is intrahepatic biliary ductal dilatation, left greater than right. No calcified choledocholithiasis. There is no focal hepatic lesion. 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 Mild pancreatic atrophy without ductal dilatation or peripancreatic inflammatory change. 5 mm hypodensity in the upper spleen, unchanged from prior. The adrenal glands are normal. Kidneys demonstrate symmetric enhancement and excretion without hydronephrosis or perinephric stranding. Simple cyst in the lower right kidney is again seen.  The stomach is physiologically distended. There are no dilated or thickened bowel loops. The appendix is not definitively identified, no inflammatory change in the right lower quadrant to suggest appendicitis. There is a moderate volume of colonic stool without colonic wall thickening. Mild diverticulosis of the distal colon without diverticulitis. No free air, free fluid, or intra-abdominal fluid collection.  No retroperitoneal adenopathy. Abdominal aorta is normal in caliber. There is moderate diffuse atherosclerosis of the abdominal aorta and its branches. No aneurysm.  Within the pelvis the urinary bladder is near completely decompressed, question of bladder wall thickening. Uterus is surgically absent. There is no adnexal mass. There is no pelvic free fluid.  There are no acute or suspicious osseous abnormalities. There is postsurgical change in the lower lumbar spine.  IMPRESSION: 1. No acute intrathoracic abnormality. 2. Progressive dilatation of the extrahepatic biliary tree postcholecystectomy. No calcified choledocholithiasis. Correlation with LFTs recommended. If there is clinical concern for biliary obstruction, MRCP could be considered. 3. Question of bladder wall thickening versus  nondistention. Correlation with urinalysis recommended. 4. There is unchanged prominence of the ascending thoracic aorta, 3.9 cm. This is unchanged over the course of 2 years. 5. Right breast 8 mm nodule centrally. Recommend up-to-date mammography. 6. Diverticulosis without diverticulitis. Unchanged common hepatic artery aneurysm.   Electronically Signed   By: Rubye Oaks M.D.   On: 11/27/2014 21:49   Ct Elbow Left W/o Cm  12/17/2014   CLINICAL DATA:  Fall, left elbow pain  EXAM: CT OF THE LEFT ELBOW WITHOUT CONTRAST  TECHNIQUE: Multidetector CT imaging was performed according to the standard protocol. Multiplanar CT image reconstructions were also generated.  COMPARISON:  Forearm radiograph performed earlier today  FINDINGS: No fracture or dislocation.  No joint effusion.  IMPRESSION: Negative. No that the area of possible foreign body seen on radiograph is not included on this examination.   Electronically Signed   By: Esperanza Heir M.D.   On: 12/17/2014 21:29   Dg Chest Port 1 View  12/17/2014   CLINICAL DATA:  Fall, weakness  EXAM: PORTABLE CHEST - 1 VIEW  COMPARISON:  CT chest dated 11/27/2014  FINDINGS: Lungs are essentially clear.  No pleural effusion or pneumothorax.  Cardiomegaly.  IMPRESSION: No evidence of acute cardiopulmonary disease.   Electronically Signed   By: Charline Bills M.D.   On: 12/17/2014 00:12  Carotid Doppler  There is 1-39% bilateral ICA stenosis. Vertebral artery flow is antegrade.    PHYSICAL EXAM Pleasant elderly lady not in distress. . Afebrile. Head is nontraumatic. Neck is supple without bruit.    Cardiac exam no murmur or gallop. Lungs are clear to auscultation. Distal pulses are well felt. Neurological Exam :  Awake alert oriented x 3 normal speech and language. Mild left lower face asymmetry. Tongue midline. No drift. Mild diminished fine finger movements on left. Orbits right over left upper extremity. Mild left grip weak.. Normal sensation . Normal  coordination. ASSESSMENT/PLAN Ms. Shaquita Fort is a 74 y.o. female with history of HTN, Aortic Aneurysm, Dyslipidemia, and Hypothyroid presenting with intermittent Left sided chest pain and recurrent syncopal episodes. [name] [name] did not receive IV t-PA due to delay in arrival.   Stroke:  Pat[place] right MCA infarcts, embolic secondary to unknown source  MRI  Patchy R MCA infarcts. small vessel disease   MRA  No large vessel stenosis  Carotid Doppler  No significant stenosis   2D Echo pending  TEE to look for embolic source. Arranged with [facility] for tomorrow.  If positive for PFO (patent foramen ovale), check bilateral lower extremity venous dopplers to rule out DVT as possible source of stroke. (I have made patient NPO after midnight tonight). If TEE negative, a [facility] electrophysiologist will consult and consider placement of an implantable loop recorder to evaluate for atrial fibrillation as etiology of stroke. This has been explained to patient/family by Dr. Pearlean Brownie and they are agreeable.   LDL 96  HgbA1c 6.6  Lovenox 40 mg sq daily for VTE prophylaxis  DIET DYS 3 Room service appropriate?: Yes; Fluid consistency:: Nectar Thick  no antithrombotic prior to admission, now on aspirin 325 mg orally every day  Patient counseled to be compliant with her antithrombotic medications  Ongoing aggressive stroke risk factor management  Therapy recommendations:  CIR. Consult in place  Disposition:  pending   Hypertension  Medicine managing  Permissive hypertension (OK if < 220/120) but gradually normalize in 5-7 days  Hyperlipidemia  Home meds:  lipitor 20,  resumed in hospital  LDL 96, goal < 70  Continue statin at discharge  Diabetes, type II, new onset, HgbA1c 6.6, at goal < 7.0   Other Stroke Risk Factors  Advanced age  Cigarette smoker, advised to stop smoking  ETOH use  Obesity, Body mass index is 39.29 kg/(m^2).    Obstructive sleep apnea, not using CPAP at home  Other Active Problems  Multiple Sclerosis (Dr. Sandria Manly in past)  Multiple falls  Hypothyroid   Chronic pain syndrome  Bipolar-neuropathy  Acute bronchitis  Hospital day # 2  [facility] See Amion for Pager information 12/20/2014 1:42 PM  I have personally examined this patient, reviewed notes, independently viewed imaging studies, participated in medical decision making and plan of care. I have made any additions or clarifications directly to the above note.  [name] [name] presented with left sided weakness from embolic right MCA branch infarct and is at risk for recurrent strokes.TIAs, neurologic worsening and needs ongoing stroke eval and aggressive risk factor modification.Check TEE and loop recorder. Delia Heady, MD Medical Director [facility] Pager: [phone] 12/20/2014 8:27 PM    To contact Stroke Continuity provider, please refer to [email or web address]. After hours, contact General Neurology ]

## 2014-12-20 NOTE — Progress Notes (Deleted)
Patient has removed IV placed this am; continually disconnecting telemetry. Patient is not retaining safety instructions. MD notifiied per Amion text.

## 2014-12-20 NOTE — Progress Notes (Signed)
Speech Language Pathology Treatment: Dysphagia  Patient Details Name: [name] [name] MRN: [id number] DOB: 06/19/1941 Today's Date: 12/20/2014 Time: 4098-1191 SLP Time Calculation (min) (ACUTE ONLY): 25 min  Assessment / Plan / Recommendation Clinical Impression  Today pt appears sleepy but participative.  Pt states she did not sleep last night.  SLP provided oral care for pt as she recognized oral pocketing on left but did not independently make attempts to clear.   Approximately 1 tsp size of of sausage/eggs removed from left buccal region using spoon and patient oral swish/expectoration of water. Intake of nectar thick liquids, thin water, and applesauce provided - delayed oral transiting with left anterior spillage of liquids without pt awareness.  Suspect delayed pharyngeal swallow but no indication of pharyngeal residuals nor aspiration/airway compromise.  Using teach back, reinforced effective compensation strategies.   Recommend continue diet with strict precautions.  Will follow for readiness for dietary advancement or indication for MBS.     HPI Other Pertinent Information: [name] [name] is a 74 y.o. female with a history of HTN, Aortic Aneurysm, Dyslipidemia, and Hypothyroid who presented to the ED 12/16/14 with complaints of sharp intermittent left sided chest pain. She also had a fall without injury. She was seen in the ED and discharged but returned after suffering a syncopal episode and was referred for medical admission. MRI revealed Right MCA infarcts.   Pertinent Vitals Pain Assessment: No/denies pain  SLP Plan  Continue with current plan of care (md please order cognitive linguistic evaluation if you agree)    Recommendations Diet recommendations: Dysphagia 3 (mechanical soft);Nectar-thick liquid Liquids provided via: Cup Medication Administration: Whole meds with puree Supervision: Full supervision/cueing for compensatory strategies Compensations: Slow rate;Small  sips/bites;Check for pocketing;Check for anterior loss (pt to rinse and expectorate with water after meals to aid oral clearance) Postural Changes and/or Swallow Maneuvers: Seated upright 90 degrees;Upright 30-60 min after meal              Oral Care Recommendations:  (oral care AFTER meals to assure clearance) Follow up Recommendations: Inpatient Rehab Plan: Continue with current plan of care (md please order cognitive linguistic evaluation if you agree)    GO    Donavan Burnet, MS [facility] SLP [phone]

## 2014-12-20 NOTE — Progress Notes (Signed)
Physical Therapy Treatment Patient Details Name: [name] [name] MRN: [id number] DOB: 1941-03-17 Today's Date: 12/20/2014    History of Present Illness [name] [name] is a 74 y.o. female with a history of HTN, Aortic Aneurysm, Dyslipidemia, and Hypothyroid who presented to the ED 12/16/14  with complaints of sharp intermittent Left sided chest pain rated at a 5/10 associated with SOB Nausea and No Vomiting or Diaphoresis today. She also had a fell today Without injury. She was seen in the ED earlier and discharged but returned after suffering a syncopal episode and was referred for medical admission. MRI revealed Right MCA infarcts.    PT Comments    Pt tolerated ambulation this date and shows good rehab potential. Con't to recommend CIR Upon d/c for maximal functional recovery as patient was indep PTA and doing chores in house.  Follow Up Recommendations  CIR     Equipment Recommendations  None recommended by PT    Recommendations for Other Services       Precautions / Restrictions Precautions Precautions: Fall Restrictions Weight Bearing Restrictions: No    Mobility  Bed Mobility Overal bed mobility: Needs Assistance Bed Mobility: Supine to Sit     Supine to sit: Mod assist     General bed mobility comments: pt able to bring LEs off EOB and only required assist for trunk elevation, HOB was elevated  Transfers Overall transfer level: Needs assistance Equipment used: Rolling walker (2 wheeled) Transfers: Sit to/from Stand Sit to Stand: Min assist         General transfer comment: completed stand x2, marched in place x 10 reps, noted L LE weakness  Ambulation/Gait Ambulation/Gait assistance: Mod assist;+2 safety/equipment Ambulation Distance (Feet): 10 Feet (x2) Assistive device: Rolling walker (2 wheeled) Gait Pattern/deviations: Step-to pattern;Decreased step length - left;Decreased stance time - left;Decreased dorsiflexion - left;Decreased weight shift to  left Gait velocity: slow   General Gait Details: pt with difficulty maintaining L UE grip on walker, noted L LE weakness with knee instability, modA to help support patient during L LE WBing, quickly fatigued   Stairs            Wheelchair Mobility    Modified Rankin (Stroke Patients Only)       Balance           Standing balance support: Bilateral upper extremity supported Standing balance-Leahy Scale: Poor                      Cognition Arousal/Alertness: Awake/alert Behavior During Therapy: WFL for tasks assessed/performed Overall Cognitive Status: Within Functional Limits for tasks assessed                      Exercises      General Comments        Pertinent Vitals/Pain Pain Assessment: No/denies pain    Home Living                      Prior Function            PT Goals (current goals can now be found in the care plan section) Progress towards PT goals: Progressing toward goals    Frequency  Min 3X/week    PT Plan Current plan remains appropriate    Co-evaluation             End of Session Equipment Utilized During Treatment: Gait belt Activity Tolerance: Patient tolerated treatment well Patient left: in chair;with call bell/phone  within reach;with chair alarm set (PAM from CIR present)     Time: 1310-1330 PT Time Calculation (min) (ACUTE ONLY): 20 min  Charges:  $Gait Training: 8-22 mins                    G Codes:      Marcene Brawn 12/20/2014, 4:38 PM   Lewis Shock, PT, DPT Pager #: [phone] Office #: [phone]

## 2014-12-21 ENCOUNTER — Encounter (HOSPITAL_COMMUNITY)
Admission: EM | Disposition: A | Discharge status: Skilled Nursing Facility | Payer: Medicare Other | Source: Home / Self Care | Attending: Family Medicine

## 2014-12-21 ENCOUNTER — Inpatient Hospital Stay (HOSPITAL_COMMUNITY): Payer: Medicare Other

## 2014-12-21 ENCOUNTER — Encounter (HOSPITAL_COMMUNITY): Payer: Self-pay | Admitting: *Deleted

## 2014-12-21 DIAGNOSIS — I10 Essential (primary) hypertension: Secondary | ICD-10-CM

## 2014-12-21 DIAGNOSIS — E039 Hypothyroidism, unspecified: Secondary | ICD-10-CM

## 2014-12-21 HISTORY — PX: TEE WITHOUT CARDIOVERSION: SHX5443

## 2014-12-21 LAB — HEMOGLOBIN A1C
HEMOGLOBIN A1C: 6.5 % — AB (ref 4.8–5.6)
MEAN PLASMA GLUCOSE: 140 mg/dL

## 2014-12-21 SURGERY — LOOP RECORDER INSERTION
Anesthesia: LOCAL

## 2014-12-21 SURGERY — ECHOCARDIOGRAM, TRANSESOPHAGEAL
Anesthesia: Moderate Sedation

## 2014-12-21 MED ORDER — BUTAMBEN-TETRACAINE-BENZOCAINE 2-2-14 % EX AERO
INHALATION_SPRAY | CUTANEOUS | Status: DC | PRN
  Administered 2014-12-21: 2 via TOPICAL

## 2014-12-21 MED ORDER — ASPIRIN EC 81 MG PO TBEC
81.0000 mg | DELAYED_RELEASE_TABLET | Freq: Every day | ORAL | Status: DC
  Administered 2014-12-22: 81 mg via ORAL
  Filled 2014-12-21: qty 1

## 2014-12-21 MED ORDER — LORAZEPAM 0.5 MG PO TABS
0.5000 mg | ORAL_TABLET | Freq: Once | ORAL | Status: AC
  Administered 2014-12-21: 0.5 mg via ORAL
  Filled 2014-12-21: qty 1

## 2014-12-21 MED ORDER — FENTANYL CITRATE (PF) 100 MCG/2ML IJ SOLN
INTRAMUSCULAR | Status: AC
  Filled 2014-12-21: qty 2

## 2014-12-21 MED ORDER — MIDAZOLAM HCL 10 MG/2ML IJ SOLN
INTRAMUSCULAR | Status: DC | PRN
  Administered 2014-12-21 (×3): 1 mg via INTRAVENOUS

## 2014-12-21 MED ORDER — FENTANYL CITRATE (PF) 100 MCG/2ML IJ SOLN
INTRAMUSCULAR | Status: DC | PRN
  Administered 2014-12-21 (×2): 25 ug via INTRAVENOUS

## 2014-12-21 MED ORDER — CLOPIDOGREL BISULFATE 75 MG PO TABS
75.0000 mg | ORAL_TABLET | Freq: Every day | ORAL | Status: DC
  Administered 2014-12-21 – 2014-12-22 (×2): 75 mg via ORAL
  Filled 2014-12-21 (×2): qty 1

## 2014-12-21 MED ORDER — GADOBENATE DIMEGLUMINE 529 MG/ML IV SOLN
37.0000 mL | Freq: Once | INTRAVENOUS | Status: AC | PRN
  Administered 2014-12-21: 37 mL via INTRAVENOUS

## 2014-12-21 MED ORDER — SODIUM CHLORIDE 0.9 % IV SOLN
INTRAVENOUS | Status: DC
  Administered 2014-12-21: 12:00:00 via INTRAVENOUS

## 2014-12-21 MED ORDER — MIDAZOLAM HCL 5 MG/ML IJ SOLN
INTRAMUSCULAR | Status: AC
  Filled 2014-12-21: qty 2

## 2014-12-21 NOTE — Progress Notes (Signed)
Pt brought home cpap, RT placed on nasal pillows at this time. Pt tolerating well

## 2014-12-21 NOTE — Op Note (Signed)
INDICATIONS: stroke  PROCEDURE:   Informed consent was obtained prior to the procedure. The risks, benefits and alternatives for the procedure were discussed and the patient comprehended these risks.  Risks include, but are not limited to, cough, sore throat, vomiting, nausea, somnolence, esophageal and stomach trauma or perforation, bleeding, low blood pressure, aspiration, pneumonia, infection, trauma to the teeth and death.    After a procedural time-out, the oropharynx was anesthetized with 20% benzocaine spray. The patient was given 3g versed and 50cg fentanyl for moderate sedation.   The transesophageal probe was inserted in the esophagus and stomach without difficulty and multiple views were obtained.  The patient was kept under observation until the patient left the procedure room.  The patient left the procedure room in stable condition.   Agitated microbubble saline contrast was administered.  COMPLICATIONS:    There were no immediate complications.  FINDINGS:  10 x10 mm high mobile echodense mass is seen attached to the aortic wall at the level of the sinotubular junction, directly above the ostium of the right coronary artery. It appears to be associated with an atherosclerotic plaque Otherwise normal study (normal LV EF, small left atrium without thrombus, normal valves, no PFO or ASD). Incidental note of severe lipomatous hypertrophy of the atrial septum. RECOMMENDATIONS:    Suspect thrombus related to ulcerated plaque.  MRI may be useful to characterize mass as thrombus versus neoplasm (latter is less likely), if this will change management. This is the cause of her embolic stroke and loop recorder is not necessary.  Time Spent Directly with the Patient:  30 minutes   Analy Bassford 12/21/2014, 9:20 AM

## 2014-12-21 NOTE — Evaluation (Signed)
SLP Cancellation Note  Patient Details Name: [name] [name] MRN: [id number] DOB: [birth year]   Cancelled treatment:       Reason Eval/Treat Not Completed: Other (comment);Medical issues which prohibited therapy (pt NPO for TEE)-    Donavan Burnet, MS [facility] SLP [phone]

## 2014-12-21 NOTE — Progress Notes (Signed)
  Echocardiogram Echocardiogram Transesophageal has been performed.  [name] [name] 12/21/2014, 11:57 AM

## 2014-12-21 NOTE — Progress Notes (Signed)
This Clinical research associate spoke with the patient's daughter regarding disposition.  Pt's daughter stated that her mom (patient) did not want to go to SNF, but she is physically unable to care for her at home.  The daughter made note that if her mom did go to a SNF then she would in turn potentially lose her house because she has no income to keep it.  The daughter would like to talk to social work to see what options she has in regards to going to SNF or home with home health services.  She stated that her sister Amy lives out of town with her 8 children and all of other family is either elderly or working and unable to help with the patient's care.  The daughter understands that the patient would be safer going to a SNF given her condition and lack of supportive help at home but at the same time she feels a little guilty trying to make her mom go to SNF when she clearly doesn't want to.  This writer reassured the patient's daughter that she shouldn't feel guilty because it is what's in the best interest for the patient.  I explained to the daughter that I would pass on her questions and concerns to the social worker and that they will contact her tomorrow regarding these matters.  Will continue to monitor. Sondra Come, RN

## 2014-12-21 NOTE — Progress Notes (Signed)
Placed patient on home CPAP for the night  

## 2014-12-21 NOTE — Progress Notes (Signed)
PT Cancellation Note  Patient Details Name: [name] [name] MRN: [id number] DOB: 09/22/1940   Cancelled Treatment:    Reason Eval/Treat Not Completed: Patient at procedure or test/unavailable Patient has been taken off unit for test/procedure. Will follow up as schedule allows.  Berton Mount 12/21/2014, 1:44 PM [place], [place] [phone]

## 2014-12-21 NOTE — H&P (View-Only) (Signed)
74 year old lady with h/o hypertension, hypothyroidism, admitted for a fall, a questionable syncope and she reports pain in the left elbow. Septic work up so far has been negative. CXR negative for infection and UA is negative for infection.  Initial CT head was negative. Ordered MRI brain . Ordered X RAY OF THE left elbow, .  On exam, Patient is drowsy but answering questions appropriately.  CVS: s1s2 Lungs : clear no wheezing or rhonchi Abdomen: Soft non tender non distended bowel sounds heard.  Extremities: Trace pedal edema Neuro: She is arousable, answering questions appropriately,. Able to move all extremities except for left forearm, reports tenderness .   Plan: Order levaquin for bronchitis and PT eval.   Kathlen Mody, MD [phone]

## 2014-12-21 NOTE — Interval H&P Note (Signed)
ELECTROPHYSIOLOGY CONSULT NOTE  Patient ID: [name] [name] MRN: [id number], DOB/AGE: [birth year]   Admit date: 12/16/2014 Date of Consult: 12/20/2014  Primary Physician: Ezequiel Kayser, MD Primary Cardiologist: [place] Reason for Consultation: Cryptogenic stroke; recommendations regarding Implantable Loop Recorder  History of Present Illness  [name] [name] was admitted on 72/24/2016 with intermittent left sided chest pain and recurrent syncopal spells. Imaging demonstrated patchy right MCA infarcts. She has undergone workup for stroke including echocardiogram and carotid dopplers. The patient has been monitored on telemetry which has demonstrated sinus rhythm with no arrhythmias. Inpatient stroke work-up is to be completed with a TEE.   Her syncopal events are described as not frank syncope but as periods of dizziness where she gets diaphoretic. These occur about once a year. She has underlying conduction system disease with RBBB.   Echocardiogram this admission demonstrated EF 55%, mild LVH, LA 30. Lab work is reviewed.  Prior to admission, the patient denies chest pain, shortness of breath, dizziness, palpitations. They are recovering from their stroke with plans to participate in CIR at discharge.  EP has been asked to evaluate for placement of an implantable loop recorder to monitor for atrial fibrillation.  ROS is negative except as outlined above.    Past Medical History  Diagnosis Date  . HTN (hypertension)   . Ascending aortic aneurysm   . Dyslipidemia   . Tobacco dependence   . Hypothyroidism   . Depression   . Neurogenic bladder   . Obesity   . Dog bite(E906.0)     RLL with systemic inflammatory response  . Chronic low back pain   . Anxiety   . MS (multiple sclerosis)     Followed by Dr. Sandria Manly  . Arthritis   . History of ulcer disease 30 yrs ago  . IBS (irritable bowel syndrome)   . Incontinence in female    . Obstructive sleep apnea     a. compliant with CPAP     Surgical History:  Past Surgical History  Procedure Laterality Date  . Cholecystectomy    . Hysterectomy    . Nasal sinus surgery    . Left eye surgery    . Bilateral foot surgery    . Leg surgery  2006    due to fx leg   . Back surgery    . Total knee arthroplasty Left 04/11/2014    Procedure: LEFT TOTAL KNEE ARTHROPLASTY; Surgeon: Shelda Pal, MD; Location: [facility] ORS; Service: Orthopedics; Laterality: Left;     Prescriptions prior to admission  Medication Sig Dispense Refill Last Dose  . amLODipine (NORVASC) 5 MG tablet Take 5 mg by mouth daily.   12/15/2014 at Unknown time  . atorvastatin (LIPITOR) 20 MG tablet Take 20 mg by mouth daily.   12/15/2014 at Unknown time  . baclofen (LIORESAL) 10 MG tablet Take 10 mg by mouth 2 (two) times daily as needed for muscle spasms.   unknown  . buPROPion (WELLBUTRIN XL) 300 MG 24 hr tablet Take 1 tablet by mouth daily.    12/16/2014 at Unknown time  . Cholecalciferol (VITAMIN D) 2000 UNITS tablet Take 4,000 Units by mouth 2 (two) times daily.    12/16/2014 at Unknown time  . clonazePAM (KLONOPIN) 1 MG tablet Take 1 mg by mouth 3 (three) times daily.   12/16/2014 at Unknown time  . Cyanocobalamin (VITAMIN B 12 PO) Take 20,000-25,000 mcg by mouth every morning.   12/16/2014 at Unknown time  . diclofenac sodium (VOLTAREN) 1 % GEL Apply 2  g topically every 8 (eight) hours as needed (pain).   unknown  . diphenoxylate-atropine (LOMOTIL) 2.5-0.025 MG per tablet Take 2 tablets by mouth 4 (four) times daily as needed for diarrhea or loose stools.    unknown at Unknown time  . FLUoxetine (PROZAC) 40 MG capsule Take 40 mg by mouth every morning.    12/16/2014 at Unknown time  . gabapentin (NEURONTIN) 100 MG capsule Take 100-200 mg by mouth every 8 (eight) hours.    12/16/2014 at  Unknown time  . levothyroxine (SYNTHROID, LEVOTHROID) 200 MCG tablet Take 200 mcg by mouth daily before breakfast.   12/16/2014 at Unknown time  . losartan (COZAAR) 100 MG tablet Take 1 tablet by mouth every morning.    12/16/2014 at Unknown time  . omeprazole (PRILOSEC) 20 MG capsule Take 20 mg by mouth daily as needed (pain).    12/16/2014 at Unknown time  . pantoprazole (PROTONIX) 40 MG tablet Take 40 mg by mouth 2 (two) times daily.   12/16/2014 at Unknown time  . pravastatin (PRAVACHOL) 40 MG tablet Take 40 mg by mouth at bedtime.   12/15/2014 at Unknown time  . Umeclidinium-Vilanterol (ANORO ELLIPTA) 62.5-25 MCG/INH AEPB Inhale into the lungs daily.   12/16/2014 at Unknown time  . levofloxacin (LEVAQUIN) 500 MG tablet Take 1 tablet (500 mg total) by mouth daily. (Patient not taking: Reported on 11/27/2014) 4 tablet 0 Not Taking at Unknown time    Inpatient Medications:  . antiseptic oral rinse 7 mL Mouth Rinse BID  . aspirin EC 325 mg Oral Daily  . atorvastatin 20 mg Oral q1800  . buPROPion 300 mg Oral Daily  . celecoxib 200 mg Oral Daily  . cholecalciferol 4,000 Units Oral BID  . clonazePAM 1 mg Oral QHS  . enoxaparin (LOVENOX) injection 40 mg Subcutaneous Q24H  . FLUoxetine 40 mg Oral Daily  . gabapentin 100 mg Oral 3 times per day  . levothyroxine 200 mcg Oral QAC breakfast  . living well with diabetes book  Does not apply Once  . losartan 100 mg Oral Daily  . metFORMIN 500 mg Oral BID WC  . nicotine 7 mg Transdermal Daily  . nitroGLYCERIN 0.5 inch Topical 4 times per day  . pantoprazole 40 mg Oral Daily  . sodium chloride 3 mL Intravenous Q12H  . sodium chloride 3 mL Intravenous Q12H  . tiotropium 18 mcg Inhalation Daily    Allergies:  Allergies  Allergen Reactions  . Codeine Other (See Comments)    Real bad  chest pains  . Lisinopril Cough  . Demerol Nausea Only  . Penicillins Rash and Other (See Comments)    Pt not sure "chestpains"    History   Social History  . Marital Status: Widowed    Spouse Name: N/A  . Number of Children: N/A  . Years of Education: N/A   Occupational History  . Retired Advertising account planner    Social History Main Topics  . Smoking status: Current Every Day Smoker -- 0.50 packs/day for 30 years    Types: Cigarettes  . Smokeless tobacco: Never Used  . Alcohol Use: Yes     Comment: Rare alcohol use  . Drug Use: No  . Sexual Activity: Not on file   Other Topics Concern  . Not on file   Social History Narrative     Family History  Problem Relation Age of Onset  . Cancer Father   . Emphysema Father      Physical Exam: Filed Vitals:  12/20/14 0245 12/20/14 0650 12/20/14 0933 12/20/14 1404  BP: 137/64 150/87 133/75 111/82  Pulse: 69 70 72 80  Temp: 97.8 F (36.6 C) 97.9 F (36.6 C) 97.6 F (36.4 C) 98.2 F (36.8 C)  TempSrc: Oral Oral Oral Oral  Resp: 18 20 20 20   Height:      Weight:      SpO2: 97% 95% 97% 98%    GEN- The patient is obese and lethargic appearing, alert and oriented x 3 today.  Head- normocephalic, atraumatic Eyes- Sclera clear, conjunctiva pink Ears- hearing intact Oropharynx- clear Neck- supple, Lungs- Clear to ausculation bilaterally, normal work of breathing Heart- Regular rate and rhythm, no murmurs, rubs or gallops  GI- soft, NT, ND, + BS Extremities- no clubbing, cyanosis, 1+ edema MS- no significant deformity or atrophy Skin- no rash or lesion Psych- euthymic mood, full affect   Labs:   Recent Labs    Lab Results  Component Value Date   WBC 11.1* 12/17/2014   HGB 14.8 12/17/2014   HCT 44.1 12/17/2014   MCV 90.2 12/17/2014   PLT 313 12/17/2014        Last Labs      Recent Labs Lab 12/16/14 1546 12/17/14 0244  NA 139 140  K 4.8 3.9  CL 103 105  CO2 27 26  BUN 24* 21*  CREATININE 1.01* 0.91  CALCIUM 9.2 9.1  PROT 7.4 --   BILITOT 1.8* --   ALKPHOS 71 --   ALT 81* --   AST 123* --   GLUCOSE 102* 121*      Radiology/Studies:  Mr Brain Wo Contrast 12/18/2014 CLINICAL DATA: Recent fall at home without head injury. No reported loss of consciousness. EXAM: MRI HEAD WITHOUT CONTRAST MRA HEAD WITHOUT CONTRAST TECHNIQUE: Multiplanar, multiecho pulse sequences of the brain and surrounding structures were obtained without intravenous contrast. Angiographic images of the head were obtained using MRA technique without contrast. COMPARISON: Head CT 12/16/2014 and MRI 09/18/2010 FINDINGS: MRI HEAD FINDINGS The examination had to be discontinued prior to completion due to patient's inability to tolerate being in the scanner any longer and inability to remain motionless. Patient declined further imaging and medication for anxiolysis. Axial and coronal diffusion, sagittal T1, and axial T2 weighted images were obtained. Anterior right temporal lobe is obscured on diffusion-weighted imaging due to susceptibility artifact. There are multiple patchy areas of acute infarction in the right MCA territory involving cortex and white matter of the posterior frontal lobe, parietal lobe, posterior temporal lobe, and insula/external capsule. There is associated mild cytotoxic edema without mass effect. No parenchymal hematoma is seen. There is no evidence of mass, midline shift, or extra-axial fluid collection. Ventricles and sulci are within normal limits for age. Patchy and confluent T2 hyperintensities in the subcortical and deep cerebral white matter and in the pons are nonspecific but compatible with rather extensive chronic small vessel ischemic disease. Chronic ischemic changes are also noted in the  thalami. Chronic lacunar infarcts are present in the centrum semiovale bilaterally and in the anterior limb of the left internal capsule. Prior bilateral cataract extraction is noted. Small left sphenoid sinus mucous retention cyst is noted. Mastoid air cells are clear. Major intracranial vascular flow voids are preserved. MRA HEAD FINDINGS Images are mildly to moderately motion degraded. The visualized distal vertebral arteries are patent and codominant. PICA and SCA origins are patent. Basilar artery is patent and mildly tortuous without evidence of significant stenosis. Posterior communicating arteries are not clearly identified. P1 and  proximal P2 segments are patent bilaterally without evidence of significant stenosis. More distal PCAs are not well evaluated. Intracranial internal carotid arteries are patent without evidence of significant stenosis. ACAs are patent without evidence of proximal stenosis. M1 segments are patent without stenosis. M2 trunks are patent bilaterally. Evaluation of MCA branch vessels is limited due to degraded image quality, with suggestion of a slightly diminished number of more distal MCA branches on the left compared to the right. No intracranial aneurysm is identified. IMPRESSION: 1. Motion degraded, incomplete examination as above. 2. Patchy right MCA territory acute infarcts. No evidence of hemorrhage. 3. Advanced chronic small vessel ischemic disease. 4. No evidence of major intracranial arterial occlusion or significant proximal stenosis. Suboptimal branch vessel evaluation. Electronically Signed By: Sebastian Ache On: 12/18/2014 10:00   Dg Chest Port 1 View 12/17/2014 CLINICAL DATA: Fall, weakness EXAM: PORTABLE CHEST - 1 VIEW COMPARISON: CT chest dated 11/27/2014 FINDINGS: Lungs are essentially clear. No pleural effusion or pneumothorax. Cardiomegaly. IMPRESSION: No evidence of acute cardiopulmonary disease. Electronically Signed By: Charline Bills  M.D. On: 12/17/2014 00:12    12-lead ECG sinus rhythm, RBBB otherwise normal intervals All prior EKG's in EPIC reviewed with no documented atrial fibrillation  Telemetry sinus rhythm  Assessment and Plan:  1. Cryptogenic stroke The patient presents with cryptogenic stroke. The patient has a TEE planned for this AM. I spoke at length with the patient about monitoring for afib with an implantable loop recorder. Risks, benefits, and alteratives to implantable loop recorder were discussed with the patient today. At this time, the patient is very clear in their decision to proceed with implantable loop recorder.   Wound care was reviewed with the patient (keep incision clean and dry for 3 days). Wound check scheduled for 01/02/15 at 4PM  2. Sleep apnea Compliance with CPAP encouraged  3. Dizziness/pre-syncope Likely vasovagal by history LINQ will allow for evaluation of arrhythmias during episodes  Please call with questions.   Gypsy Balsam, NP 12/20/2014 5:06 PM       12/21/2014 8:41 AM  [name]  has presented today for surgery, with the diagnosis of STROKE  The various methods of treatment have been discussed with the patient and family. After consideration of risks, benefits and other options for treatment, the patient has consented to  Procedure(s): TRANSESOPHAGEAL ECHOCARDIOGRAM (TEE) (N/A) as a surgical intervention .  The patient's history has been reviewed, patient examined, no change in status, stable for surgery.  I have reviewed the patient's chart and labs.  Questions were answered to the patient's satisfaction.     Cuong Moorman

## 2014-12-21 NOTE — Progress Notes (Signed)
Physical Therapy Treatment Patient Details Name: [name] [name] MRN: [id number] DOB: 1941-01-01 Today's Date: 12/21/2014    History of Present Illness [name] [name] is a 74 y.o. female with a history of HTN, Aortic Aneurysm, Dyslipidemia, and Hypothyroid who presented to the ED 12/16/14  with complaints of sharp intermittent Left sided chest pain rated at a 5/10 associated with SOB Nausea and No Vomiting or Diaphoresis today. She also had a fell today Without injury. She was seen in the ED earlier and discharged but returned after suffering a syncopal episode and was referred for medical admission. MRI revealed Right MCA infarcts.    PT Comments    Patient making progress towards physical therapy goals. Eager to work with therapy and get out of bed today. Min assist for ambulating up to 25 feet today with heavy reliance on RW for support. Tolerated therapeutic exercises well. Patient will continue to benefit from skilled physical therapy services to further improve independence with functional mobility.   Follow Up Recommendations  CIR (If denied by CIR - recommend SNF)     Equipment Recommendations  None recommended by PT    Recommendations for Other Services       Precautions / Restrictions Precautions Precautions: Fall Restrictions Weight Bearing Restrictions: No    Mobility  Bed Mobility Overal bed mobility: Needs Assistance Bed Mobility: Supine to Sit     Supine to sit: Min assist     General bed mobility comments: Min assist for truncal support. Cues for use of rail as needed. Scoots to EOB without assist.  Transfers Overall transfer level: Needs assistance Equipment used: Rolling walker (2 wheeled) Transfers: Sit to/from Stand Sit to Stand: Min assist         General transfer comment: Min assist for boost to stand. VC for hand placement. Slow to rise, leaning heavily over RW initially. Cues for upright stance and glute  activation  Ambulation/Gait Ambulation/Gait assistance: Min assist Ambulation Distance (Feet): 25 Feet Assistive device: Rolling walker (2 wheeled) Gait Pattern/deviations: Step-to pattern;Decreased step length - right;Decreased stance time - left;Decreased stride length;Decreased weight shift to left;Decreased dorsiflexion - left;Trunk flexed Gait velocity: slow   General Gait Details: Min assist for balance and RW control. LUE staying on RW grip today without slipping. VC for LLE clearance during swing phase of gait. No overt buckling noted although seems to have some instability in this Lt knee. Very fatigued after this distance.   Stairs            Wheelchair Mobility    Modified Rankin (Stroke Patients Only) Modified Rankin (Stroke Patients Only) Pre-Morbid Rankin Score: Moderate disability Modified Rankin: Moderately severe disability     Balance                                    Cognition Arousal/Alertness: Awake/alert Behavior During Therapy: WFL for tasks assessed/performed Overall Cognitive Status: Within Functional Limits for tasks assessed                      Exercises General Exercises - Lower Extremity Ankle Circles/Pumps: AROM;Both;10 reps;Seated Gluteal Sets: Strengthening;Both;10 reps;Seated Long Arc Quad: Strengthening;Both;10 reps;Seated Hip Flexion/Marching: Strengthening;Both;10 reps;Seated    General Comments        Pertinent Vitals/Pain Pain Assessment: No/denies pain    Home Living  Prior Function            PT Goals (current goals can now be found in the care plan section) Acute Rehab PT Goals PT Goal Formulation: With patient Time For Goal Achievement: 12/31/14 Potential to Achieve Goals: Fair Progress towards PT goals: Progressing toward goals    Frequency  Min 3X/week    PT Plan Current plan remains appropriate    Co-evaluation             End of Session  Equipment Utilized During Treatment: Gait belt Activity Tolerance: Patient tolerated treatment well Patient left: in chair;with call bell/phone within reach;with chair alarm set;with family/visitor present     Time: 1275-1700 PT Time Calculation (min) (ACUTE ONLY): 21 min  Charges:  $Gait Training: 8-22 mins                    G Codes:      Berton Mount 12/31/14, 6:14 PM [facility], [place] [phone]

## 2014-12-21 NOTE — Progress Notes (Addendum)
[facility] PROGRESS NOTE  [name] [name] UJW:[id number] DOB: Apr 17, 1941 DOA: 12/16/2014  PCP: Ezequiel Kayser, MD  Brief HPI: 74 year old Caucasian female with a past medical history of hypertension, aortic aneurysm, hypertension, hypothyroidism who presented after a syncopal episode. She also has had multiple falls. She was hospitalized for further management.  Past medical history:  Past Medical History  Diagnosis Date  . HTN (hypertension)   . Ascending aortic aneurysm   . Dyslipidemia   . Tobacco dependence   . Hypothyroidism   . Depression   . Neurogenic bladder   . Obesity   . Dog bite(E906.0)     RLL with systemic inflammatory response  . Chronic low back pain   . Anxiety   . MS (multiple sclerosis)     Followed by Dr. Sandria Manly  . Arthritis   . History of ulcer disease 30 yrs ago  . IBS (irritable bowel syndrome)   . Incontinence in female   . Obstructive sleep apnea     a. compliant with CPAP    Consultants: Neurology  Procedures:  2-D echocardiogram Study Conclusions - Left ventricle: The study is technically very limited. However,LV and RV function seem OK. The cavity size was normal. Wallthickness was increased in a pattern of mild LVH. The estimatedejection fraction was 55%. Images were inadequate for LV wallmotion assessment. - Aortic valve: Poorly visualized. The valve appears to be grosslynormal. - Mitral valve: Poorly visualized. The valve appears to be grosslynormal. - Right ventricle: Poorly visualized. The cavity size was normal.Systolic function was normal. - Impressions: No cardiac source of embolism was identified, butcannot be ruled out on the basis of this examination. Impressions: - No cardiac source of embolism was identified, but cannot be ruledout on the basis of this examination.  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 It appears to be associated with an atherosclerotic plaque. Otherwise normal study (normal LV EF, small left atrium without thrombus, normal valves, no PFO or ASD). Incidental note of severe lipomatous hypertrophy of the atrial septum.  Antibiotics: None  Subjective: Patient seen after her TEE. She was slightly lethargic. Denies any complaints. Concerned about the findings on her echocardiogram. Denies any weakness in any one side of her body.  Objective: Vital Signs  Filed Vitals:   12/21/14 0940 12/21/14 0941 12/21/14 0943 12/21/14 1100  BP:  139/83  115/59  Pulse:  68  83  Temp:    98.1 F (36.7 C)  TempSrc:    Oral  Resp:  11  17  Height:      Weight:      SpO2: 94% 94% 95%     Intake/Output Summary (Last 24 hours) at 12/21/14 1155 Last data filed at 12/20/14 2133  Gross per 24 hour  Intake    323 ml  Output      0 ml  Net    323 ml   Filed Weights   12/17/14 0055  Weight: 107.1 kg (236 lb 1.8 oz)    General appearance: alert, cooperative, appears stated age and no distress Head: Normocephalic, without obvious abnormality, atraumatic Resp: clear to auscultation bilaterally Cardio: regular rate and rhythm, S1, S2 normal, no murmur, click, rub or gallop GI: soft, non-tender; bowel sounds normal; no masses,  no organomegaly Extremities: extremities normal, atraumatic, no cyanosis or edema Neurologic: No focal deficits.  Lab Results:  Basic Metabolic Panel:  Recent Labs Lab 12/16/14 1546 12/17/14 0244  NA 139 140  K 4.8 3.9  CL 103 105  CO2 27 26  GLUCOSE 102* 121*  BUN 24* 21*  CREATININE 1.01* 0.91  CALCIUM 9.2 9.1   Liver Function Tests:  Recent Labs Lab 12/16/14 1546  AST 123*  ALT 81*  ALKPHOS 71  BILITOT 1.8*  PROT 7.4  ALBUMIN 3.9    Recent Labs Lab 12/16/14 1546  LIPASE 14*   CBC:  Recent Labs Lab 12/16/14 1622 12/17/14 0244  WBC 13.9* 11.1*  NEUTROABS 9.8*  --   HGB 15.1* 14.8  HCT 46.3* 44.1  MCV  89.7 90.2  PLT 322 313   Cardiac Enzymes:  Recent Labs Lab 12/16/14 1546 12/17/14 0244 12/17/14 0825 12/17/14 1433  TROPONINI <0.03 <0.03 <0.03 <0.03   BNP (last 3 results)  Recent Labs  11/27/14 1827  BNP 38.8    CBG:  Recent Labs Lab 12/16/14 2107  GLUCAP 135*      Studies/Results: No results found.  Medications:  Scheduled: . antiseptic oral rinse  7 mL Mouth Rinse BID  . [START ON 12/22/2014] aspirin EC  81 mg Oral Daily  . atorvastatin  20 mg Oral q1800  . buPROPion  300 mg Oral Daily  . celecoxib  200 mg Oral Daily  . cholecalciferol  4,000 Units Oral BID  . clonazePAM  1 mg Oral QHS  . clopidogrel  75 mg Oral Daily  . enoxaparin (LOVENOX) injection  40 mg Subcutaneous Q24H  . FLUoxetine  40 mg Oral Daily  . gabapentin  100 mg Oral 3 times per day  . levothyroxine  200 mcg Oral QAC breakfast  . living well with diabetes book   Does not apply Once  . losartan  100 mg Oral Daily  . metFORMIN  500 mg Oral BID WC  . nicotine  7 mg Transdermal Daily  . nitroGLYCERIN  0.5 inch Topical 4 times per day  . pantoprazole  40 mg Oral Daily  . sodium chloride  3 mL Intravenous Q12H  . sodium chloride  3 mL Intravenous Q12H  . tiotropium  18 mcg Inhalation Daily   Continuous: . sodium chloride 75 mL/hr at 12/18/14 0949  . sodium chloride 20 mL/hr at 12/21/14 1158   ZOX:WRUEAV chloride, acetaminophen **OR** acetaminophen, alum & mag hydroxide-simeth, bisacodyl, ondansetron **OR** ondansetron (ZOFRAN) IV, oxyCODONE, RESOURCE THICKENUP CLEAR, sodium chloride  Assessment/Plan:  Principal Problem:   Acute CVA (cerebrovascular accident) Active Problems:   HTN (hypertension)   Tobacco dependence   Hypothyroidism   Chest pain   Syncope   Fall   Faintness   Elbow pain   Pain in the chest   Stroke   Acute Stroke Patient being followed by neurology. Stroke workup in progress. 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 Neoplasm cannot be ruled out. Neurology is trying to get a cardiac MRI. Continue aspirin. Discussed with neurology. They recommended aspirin and Plavix for 3 months, followed by Plavix alone. Continue statin. HbA1c 6.6. PT and OT consulted and recommended CIR. Seen by CIR and they recommended SNF. Patient reluctant to go to SNF.   Essential Hypertension Fair Controlled on losartan 100 daily, Norvasc 5 mg.   DM 2-new onset Started on metformin 500 bid.   Hypothyroidism Continue Synthroid.   Chronic pain syndrome: Continue home medications.   History of bipolar disorder  Continue Wellbutrin XL  300 q 24 Prozac 40 daily   DVT Prophylaxis: Lovenox    Code Status: DO NOT RESUSCITATE  Family Communication: Discussed with the patient  Disposition Plan: Await workup to be completed. Patient reluctant to go to SNF. Will need ongoing discussion.   LOS: 3 days   [facility]  [facility] Pager [phone] 12/21/2014, 11:55 AM  If 7PM-7AM, please contact night-coverage at [email or web address], password [email or web address]

## 2014-12-21 NOTE — Progress Notes (Signed)
STROKE TEAM PROGRESS NOTE   SUBJECTIVE (INTERVAL HISTORY) No family is at the bedside. Patient just back from TEE. Possible thrombus from plaque, ? Cause of stroke.   OBJECTIVE Temp:  [97.6 F (36.4 C)-98.4 F (36.9 C)] 98.1 F (36.7 C) (06/29 1100) Pulse Rate:  [66-87] 83 (06/29 1100) Cardiac Rhythm:  [-] Normal sinus rhythm (06/29 0800) Resp:  [11-21] 17 (06/29 1100) BP: (104-186)/(59-115) 115/59 mmHg (06/29 1100) SpO2:  [92 %-100 %] 95 % (06/29 0943)   Recent Labs Lab 12/16/14 2107  GLUCAP 135*    Recent Labs Lab 12/16/14 1546 12/17/14 0244  NA 139 140  K 4.8 3.9  CL 103 105  CO2 27 26  GLUCOSE 102* 121*  BUN 24* 21*  CREATININE 1.01* 0.91  CALCIUM 9.2 9.1    Recent Labs Lab 12/16/14 1546  AST 123*  ALT 81*  ALKPHOS 71  BILITOT 1.8*  PROT 7.4  ALBUMIN 3.9    Recent Labs Lab 12/16/14 1622 12/17/14 0244  WBC 13.9* 11.1*  NEUTROABS 9.8*  --   HGB 15.1* 14.8  HCT 46.3* 44.1  MCV 89.7 90.2  PLT 322 313    Recent Labs Lab 12/16/14 1546 12/17/14 0244 12/17/14 0825 12/17/14 1433  TROPONINI <0.03 <0.03 <0.03 <0.03   No results for input(s): LABPROT, INR in the last 72 hours. No results for input(s): COLORURINE, LABSPEC, PHURINE, GLUCOSEU, HGBUR, BILIRUBINUR, KETONESUR, PROTEINUR, UROBILINOGEN, NITRITE, LEUKOCYTESUR in the last 72 hours.  Invalid input(s): APPERANCEUR     Component Value Date/Time   CHOL 163 12/19/2014 0340   TRIG 76 12/19/2014 0340   HDL 52 12/19/2014 0340   CHOLHDL 3.1 12/19/2014 0340   VLDL 15 12/19/2014 0340   LDLCALC 96 12/19/2014 0340   Lab Results  Component Value Date   HGBA1C 6.5* 12/20/2014      Component Value Date/Time   LABOPIA POSITIVE* 11/27/2014 1835   COCAINSCRNUR NONE DETECTED 11/27/2014 1835   LABBENZ NONE DETECTED 11/27/2014 1835   AMPHETMU NONE DETECTED 11/27/2014 1835   THCU NONE DETECTED 11/27/2014 1835   LABBARB NONE DETECTED 11/27/2014 1835    No results for input(s): ETH in the last 168  hours.  Dg Chest 2 View  11/27/2014   CLINICAL DATA:  Mid chest pain and left arm pain.  EXAM: CHEST  2 VIEW  COMPARISON:  05/17/2014 and 04/05/2014  FINDINGS: The heart size and pulmonary vascularity are normal and the lungs are clear. Tortuosity and calcification of thoracic aorta. No osseous abnormality.  IMPRESSION: No acute abnormality.  No change since the prior exams.   Electronically Signed   By: Francene Boyers M.D.   On: 11/27/2014 19:19   Dg Forearm Left  12/17/2014   CLINICAL DATA:  Larey Seat yesterday.  Pain in the left elbow.  EXAM: LEFT FOREARM - 2 VIEW  COMPARISON:  None.  FINDINGS: Radiopaque density is identified along the anteromedial aspect of the forearm and may represent foreign body or object external to the patient. There is no evidence for acute fracture or dislocation. No soft tissue gas.  IMPRESSION: Possible foreign body in the soft tissues of the forearm.  No fracture.  If there is focal pain in the elbow consider dedicated views of the elbow.   Electronically Signed   By: Norva Pavlov M.D.   On: 12/17/2014 14:45   Ct Head Wo Contrast  12/16/2014   CLINICAL DATA:  Status post fall, generalized weakness  EXAM: CT HEAD WITHOUT CONTRAST  TECHNIQUE: Contiguous axial images were  obtained from the base of the skull through the vertex without intravenous contrast.  COMPARISON:  05/17/2014  FINDINGS: No evidence of parenchymal hemorrhage or extra-axial fluid collection. No mass lesion, mass effect, or midline shift.  No CT evidence of acute infarction.  Subcortical white matter and periventricular small vessel ischemic changes. Mild intracranial atherosclerosis.  Cerebral volume is within normal limits.  No ventriculomegaly.  The visualized paranasal sinuses are essentially clear. The mastoid air cells are unopacified.  No evidence of calvarial fracture.  IMPRESSION: No evidence of acute intracranial abnormality.  Small vessel ischemic changes.   Electronically Signed   By: Charline Bills M.D.   On: 12/16/2014 23:15   Ct Chest W Contrast  11/27/2014   CLINICAL DATA:  Mid chest pain, shortness of breath, nausea and abdominal pain. Symptoms for 1 day.  EXAM: CT CHEST, ABDOMEN, AND PELVIS WITH CONTRAST  TECHNIQUE: Multidetector CT imaging of the chest, abdomen and pelvis was performed following the standard protocol during bolus administration of intravenous contrast.  CONTRAST:  OMNIPAQUE IOHEXOL 300 MG/ML  SOLN  COMPARISON:  Chest radiographs earlier this day. CT 07/17/2012  FINDINGS: CT CHEST FINDINGS  There is prominence of the ascending aorta, maximal dimension, this is unchanged from prior exam. Transverse and descending thoracic aorta are normal in caliber. There is moderate atherosclerotic calcification of is unchanged. No dissection or para-aortic stranding. The heart size is normal. There are coronary artery calcifications. No filling defects in the central pulmonary arteries. There is no mediastinal or hilar adenopathy. No pleural or pericardial effusion. Lipomatous hypertrophy at the intra-atrial septum is noted.  Trace hypoventilatory change at the lung bases. No consolidation to suggest pneumonia. No pulmonary nodule or mass. The right lower lobe 4 mm nodule on prior exam is not seen.  There is an 8 mm nodule in the upper right breast. Retroareolar nodular density is unchanged from prior CT. No axillary adenopathy.  There are no acute or suspicious osseous abnormalities.  CT ABDOMEN AND PELVIS FINDINGS  Postcholecystectomy. There is biliary prominence, the common bile duct measures 2.1 cm in greatest dimension. There is intrahepatic biliary ductal dilatation, left greater than right. No calcified choledocholithiasis. There is no focal hepatic lesion. 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 Mild pancreatic atrophy without ductal dilatation or peripancreatic inflammatory change. 5 mm hypodensity in the upper  spleen, unchanged from prior. The adrenal glands are normal. Kidneys demonstrate symmetric enhancement and excretion without hydronephrosis or perinephric stranding. Simple cyst in the lower right kidney is again seen.  The stomach is physiologically distended. There are no dilated or thickened bowel loops. The appendix is not definitively identified, no inflammatory change in the right lower quadrant to suggest appendicitis. There is a moderate volume of colonic stool without colonic wall thickening. Mild diverticulosis of the distal colon without diverticulitis. No free air, free fluid, or intra-abdominal fluid collection.  No retroperitoneal adenopathy. Abdominal aorta is normal in caliber. There is moderate diffuse atherosclerosis of the abdominal aorta and its branches. No aneurysm.  Within the pelvis the urinary bladder is near completely decompressed, question of bladder wall thickening. Uterus is surgically absent. There is no adnexal mass. There is no pelvic free fluid.  There are no acute or suspicious osseous abnormalities. There is postsurgical change in the lower lumbar spine.  IMPRESSION: 1. No acute intrathoracic abnormality. 2. Progressive dilatation of the extrahepatic biliary tree postcholecystectomy. No calcified choledocholithiasis. Correlation with LFTs  recommended. If there is clinical concern for biliary obstruction, MRCP could be considered. 3. Question of bladder wall thickening versus nondistention. Correlation with urinalysis recommended. 4. There is unchanged prominence of the ascending thoracic aorta, 3.9 cm. This is unchanged over the course of 2 years. 5. Right breast 8 mm nodule centrally. Recommend up-to-date mammography. 6. Diverticulosis without diverticulitis. Unchanged common hepatic artery aneurysm.   Electronically Signed   By: Rubye Oaks M.D.   On: 11/27/2014 21:49   Mr Maxine Glenn Head Wo Contrast  12/18/2014   CLINICAL DATA:  Recent fall at home without head injury. No  reported loss of consciousness.  EXAM: MRI HEAD WITHOUT CONTRAST  MRA HEAD WITHOUT CONTRAST  TECHNIQUE: Multiplanar, multiecho pulse sequences of the brain and surrounding structures were obtained without intravenous contrast. Angiographic images of the head were obtained using MRA technique without contrast.  COMPARISON:  Head CT 12/16/2014 and MRI 09/18/2010  FINDINGS: MRI HEAD FINDINGS  The examination had to be discontinued prior to completion due to patient's inability to tolerate being in the scanner any longer and inability to remain motionless. Patient declined further imaging and medication for anxiolysis. Axial and coronal diffusion, sagittal T1, and axial T2 weighted images were obtained.  Anterior right temporal lobe is obscured on diffusion-weighted imaging due to susceptibility artifact. There are multiple patchy areas of acute infarction in the right MCA territory involving cortex and white matter of the posterior frontal lobe, parietal lobe, posterior temporal lobe, and insula/external capsule. There is associated mild cytotoxic edema without mass effect. No parenchymal hematoma is seen.  There is no evidence of mass, midline shift, or extra-axial fluid collection. Ventricles and sulci are within normal limits for age. Patchy and confluent T2 hyperintensities in the subcortical and deep cerebral white matter and in the pons are nonspecific but compatible with rather extensive chronic small vessel ischemic disease. Chronic ischemic changes are also noted in the thalami. Chronic lacunar infarcts are present in the centrum semiovale bilaterally and in the anterior limb of the left internal capsule.  Prior bilateral cataract extraction is noted. Small left sphenoid sinus mucous retention cyst is noted. Mastoid air cells are clear. Major intracranial vascular flow voids are preserved.  MRA HEAD FINDINGS  Images are mildly to moderately motion degraded.  The visualized distal vertebral arteries are patent  and codominant. PICA and SCA origins are patent. Basilar artery is patent and mildly tortuous without evidence of significant stenosis. Posterior communicating arteries are not clearly identified. P1 and proximal P2 segments are patent bilaterally without evidence of significant stenosis. More distal PCAs are not well evaluated.  Intracranial internal carotid arteries are patent without evidence of significant stenosis. ACAs are patent without evidence of proximal stenosis. M1 segments are patent without stenosis. M2 trunks are patent bilaterally. Evaluation of MCA branch vessels is limited due to degraded image quality, with suggestion of a slightly diminished number of more distal MCA branches on the left compared to the right. No intracranial aneurysm is identified.  IMPRESSION: 1. Motion degraded, incomplete examination as above. 2. Patchy right MCA territory acute infarcts. No evidence of hemorrhage. 3. Advanced chronic small vessel ischemic disease. 4. No evidence of major intracranial arterial occlusion or significant proximal stenosis. Suboptimal branch vessel evaluation.   Electronically Signed   By: Sebastian Ache   On: 12/18/2014 10:00   Mr Brain Wo Contrast  12/18/2014   CLINICAL DATA:  Recent fall at home without head injury. No reported loss of consciousness.  EXAM: MRI HEAD WITHOUT  CONTRAST  MRA HEAD WITHOUT CONTRAST  TECHNIQUE: Multiplanar, multiecho pulse sequences of the brain and surrounding structures were obtained without intravenous contrast. Angiographic images of the head were obtained using MRA technique without contrast.  COMPARISON:  Head CT 12/16/2014 and MRI 09/18/2010  FINDINGS: MRI HEAD FINDINGS  The examination had to be discontinued prior to completion due to patient's inability to tolerate being in the scanner any longer and inability to remain motionless. Patient declined further imaging and medication for anxiolysis. Axial and coronal diffusion, sagittal T1, and axial T2 weighted  images were obtained.  Anterior right temporal lobe is obscured on diffusion-weighted imaging due to susceptibility artifact. There are multiple patchy areas of acute infarction in the right MCA territory involving cortex and white matter of the posterior frontal lobe, parietal lobe, posterior temporal lobe, and insula/external capsule. There is associated mild cytotoxic edema without mass effect. No parenchymal hematoma is seen.  There is no evidence of mass, midline shift, or extra-axial fluid collection. Ventricles and sulci are within normal limits for age. Patchy and confluent T2 hyperintensities in the subcortical and deep cerebral white matter and in the pons are nonspecific but compatible with rather extensive chronic small vessel ischemic disease. Chronic ischemic changes are also noted in the thalami. Chronic lacunar infarcts are present in the centrum semiovale bilaterally and in the anterior limb of the left internal capsule.  Prior bilateral cataract extraction is noted. Small left sphenoid sinus mucous retention cyst is noted. Mastoid air cells are clear. Major intracranial vascular flow voids are preserved.  MRA HEAD FINDINGS  Images are mildly to moderately motion degraded.  The visualized distal vertebral arteries are patent and codominant. PICA and SCA origins are patent. Basilar artery is patent and mildly tortuous without evidence of significant stenosis. Posterior communicating arteries are not clearly identified. P1 and proximal P2 segments are patent bilaterally without evidence of significant stenosis. More distal PCAs are not well evaluated.  Intracranial internal carotid arteries are patent without evidence of significant stenosis. ACAs are patent without evidence of proximal stenosis. M1 segments are patent without stenosis. M2 trunks are patent bilaterally. Evaluation of MCA branch vessels is limited due to degraded image quality, with suggestion of a slightly diminished number of more  distal MCA branches on the left compared to the right. No intracranial aneurysm is identified.  IMPRESSION: 1. Motion degraded, incomplete examination as above. 2. Patchy right MCA territory acute infarcts. No evidence of hemorrhage. 3. Advanced chronic small vessel ischemic disease. 4. No evidence of major intracranial arterial occlusion or significant proximal stenosis. Suboptimal branch vessel evaluation.   Electronically Signed   By: Sebastian Ache   On: 12/18/2014 10:00   Ct Abdomen Pelvis W Contrast  11/27/2014   CLINICAL DATA:  Mid chest pain, shortness of breath, nausea and abdominal pain. Symptoms for 1 day.  EXAM: CT CHEST, ABDOMEN, AND PELVIS WITH CONTRAST  TECHNIQUE: Multidetector CT imaging of the chest, abdomen and pelvis was performed following the standard protocol during bolus administration of intravenous contrast.  CONTRAST:  OMNIPAQUE IOHEXOL 300 MG/ML  SOLN  COMPARISON:  Chest radiographs earlier this day. CT 07/17/2012  FINDINGS: CT CHEST FINDINGS  There is prominence of the ascending aorta, maximal dimension, this is unchanged from prior exam. Transverse and descending thoracic aorta are normal in caliber. There is moderate atherosclerotic calcification of is unchanged. No dissection or para-aortic stranding. The heart size is normal. There are coronary artery calcifications. No filling defects in the central pulmonary arteries. There  is no mediastinal or hilar adenopathy. No pleural or pericardial effusion. Lipomatous hypertrophy at the intra-atrial septum is noted.  Trace hypoventilatory change at the lung bases. No consolidation to suggest pneumonia. No pulmonary nodule or mass. The right lower lobe 4 mm nodule on prior exam is not seen.  There is an 8 mm nodule in the upper right breast. Retroareolar nodular density is unchanged from prior CT. No axillary adenopathy.  There are no acute or suspicious osseous abnormalities.  CT ABDOMEN AND PELVIS FINDINGS  Postcholecystectomy. There  is biliary prominence, the common bile duct measures 2.1 cm in greatest dimension. There is intrahepatic biliary ductal dilatation, left greater than right. No calcified choledocholithiasis. There is no focal hepatic lesion. 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 Mild pancreatic atrophy without ductal dilatation or peripancreatic inflammatory change. 5 mm hypodensity in the upper spleen, unchanged from prior. The adrenal glands are normal. Kidneys demonstrate symmetric enhancement and excretion without hydronephrosis or perinephric stranding. Simple cyst in the lower right kidney is again seen.  The stomach is physiologically distended. There are no dilated or thickened bowel loops. The appendix is not definitively identified, no inflammatory change in the right lower quadrant to suggest appendicitis. There is a moderate volume of colonic stool without colonic wall thickening. Mild diverticulosis of the distal colon without diverticulitis. No free air, free fluid, or intra-abdominal fluid collection.  No retroperitoneal adenopathy. Abdominal aorta is normal in caliber. There is moderate diffuse atherosclerosis of the abdominal aorta and its branches. No aneurysm.  Within the pelvis the urinary bladder is near completely decompressed, question of bladder wall thickening. Uterus is surgically absent. There is no adnexal mass. There is no pelvic free fluid.  There are no acute or suspicious osseous abnormalities. There is postsurgical change in the lower lumbar spine.  IMPRESSION: 1. No acute intrathoracic abnormality. 2. Progressive dilatation of the extrahepatic biliary tree postcholecystectomy. No calcified choledocholithiasis. Correlation with LFTs recommended. If there is clinical concern for biliary obstruction, MRCP could be considered. 3. Question of bladder wall thickening versus nondistention. Correlation with urinalysis recommended. 4. There  is unchanged prominence of the ascending thoracic aorta, 3.9 cm. This is unchanged over the course of 2 years. 5. Right breast 8 mm nodule centrally. Recommend up-to-date mammography. 6. Diverticulosis without diverticulitis. Unchanged common hepatic artery aneurysm.   Electronically Signed   By: Rubye Oaks M.D.   On: 11/27/2014 21:49   Ct Elbow Left W/o Cm  12/17/2014   CLINICAL DATA:  Fall, left elbow pain  EXAM: CT OF THE LEFT ELBOW WITHOUT CONTRAST  TECHNIQUE: Multidetector CT imaging was performed according to the standard protocol. Multiplanar CT image reconstructions were also generated.  COMPARISON:  Forearm radiograph performed earlier today  FINDINGS: No fracture or dislocation.  No joint effusion.  IMPRESSION: Negative. No that the area of possible foreign body seen on radiograph is not included on this examination.   Electronically Signed   By: Esperanza Heir M.D.   On: 12/17/2014 21:29   Dg Chest Port 1 View  12/17/2014   CLINICAL DATA:  Fall, weakness  EXAM: PORTABLE CHEST - 1 VIEW  COMPARISON:  CT chest dated 11/27/2014  FINDINGS: Lungs are essentially clear.  No pleural effusion or pneumothorax.  Cardiomegaly.  IMPRESSION: No evidence of acute cardiopulmonary disease.   Electronically Signed   By: Charline Bills M.D.   On: 12/17/2014 00:12    Carotid Doppler  There  is 1-39% bilateral ICA stenosis. Vertebral artery flow is antegrade.    TEE 10 x10 mm high mobile echodense mass is seen attached to the aortic wall at the level of the sinotubular junction, directly above the ostium of the right coronary artery. It appears to be associated with an atherosclerotic plaque Otherwise normal study (normal LV EF, small left atrium without thrombus, normal valves, no PFO or ASD). Incidental note of severe lipomatous hypertrophy of the atrial septum.   PHYSICAL EXAM Pleasant elderly lady not in distress. . Afebrile. Head is nontraumatic. Neck is supple without bruit.    Cardiac exam no  murmur or gallop. Lungs are clear to auscultation. Distal pulses are well felt. Neurological Exam :  Awake alert oriented x 3 normal speech and language. Mild left lower face asymmetry. Tongue midline. No drift. Mild diminished fine finger movements on left. Orbits right over left upper extremity. Mild left grip weak.. Normal sensation . Normal coordination.   ASSESSMENT/PLAN [name] is a 74 y.o. female with history of HTN, Aortic Aneurysm, Dyslipidemia, and Hypothyroid presenting with intermittent Left sided chest pain and recurrent syncopal episodes. She did not receive IV t-PA due to delay in arrival.   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 small vessel disease   MRA  No large vessel stenosis  Carotid Doppler  No significant stenosis   2D Echo No source of embolus   TEE  aortic root ulcerated plaque  vs tumor  Cardiac MRI to eval for ulcer vs tumor- Dr. Pearlean Brownie discussed with Dr. Delton See. Has been ordered. Dr. Pearlean Brownie also discussed with Dr. Rito Ehrlich  LDL 96  HgbA1c 6.6  Lovenox 40 mg sq daily for VTE prophylaxis DIET DYS 3 Room service appropriate?: Yes; Fluid consistency:: Nectar Thick; Fluid restriction:: Other (see comments)  no antithrombotic prior to admission, now on aspirin 325 mg orally every day. Given large vessel intracranial atherosclerosis, patient should be treated with aspirin 81 mg and clopidogrel 75 mg orally every day x 3 months for secondary stroke prevention. After 3 months, change to plavix alone. Long-term dual antiplatelets are contraindicated due to risk for intracerebral hemorrhage. Order updated.  Patient counseled to be compliant with her antithrombotimc medications  Ongoing aggressive stroke risk factor management  Therapy recommendations:  CIR. CIR Consult recommends SNF for slower paced rehab. Rehab signed off  Disposition:  pending    Hypertension  Medicine managing  Permissive hypertension (OK if < 220/120) but gradually normalize in 5-7 days  Hyperlipidemia  Home meds:  lipitor 20,  resumed in hospital  LDL 96, goal < 70  Continue statin at discharge  Diabetes, type II, new onset, HgbA1c 6.6, at goal < 7.0   Other Stroke Risk Factors  Advanced age  Cigarette smoker, advised to stop smoking  ETOH use  Obesity, Body mass index is 39.29 kg/(m^2).   Obstructive sleep apnea, not using CPAP at home. Brought to hospital for her use.  Other Active Problems  Multiple Sclerosis (Dr. Sandria Manly in past)  Multiple falls  Hypothyroid   Chronic pain syndrome  Bipolar-neuropathy  Acute bronchitis  Risk aspiration  Hospital day # 3  [facility]oder[facility] See Amion for Pager information 12/21/2014 11:37 AM   I have personally examined this patient, reviewed notes, independently viewed imaging studies, participated in medical decision making and plan of care. I have made any additions or clarifications directly  to the above note. Agree with note above.  TEE shows aortic root ulcerated plaque with thrombus versus tumor and hence we'll check MRI scan of the heart to evaluate this further.  Delia Heady, MD Medical Director [facility] Pager: [phone] 12/21/2014 2:51 PM   To contact Stroke Continuity provider, please refer to [email or web address]. After hours, contact General Neurology ]

## 2014-12-22 ENCOUNTER — Encounter (HOSPITAL_COMMUNITY): Payer: Self-pay | Admitting: Cardiovascular Disease

## 2014-12-22 ENCOUNTER — Emergency Department (HOSPITAL_COMMUNITY)
Admission: EM | Admit: 2014-12-22 | Discharge: 2014-12-23 | Disposition: A | Discharge status: Home / Self Care | Payer: Medicare Other | Attending: Emergency Medicine | Admitting: Emergency Medicine

## 2014-12-22 DIAGNOSIS — F419 Anxiety disorder, unspecified: Secondary | ICD-10-CM | POA: Insufficient documentation

## 2014-12-22 DIAGNOSIS — Z7982 Long term (current) use of aspirin: Secondary | ICD-10-CM | POA: Insufficient documentation

## 2014-12-22 DIAGNOSIS — Z872 Personal history of diseases of the skin and subcutaneous tissue: Secondary | ICD-10-CM | POA: Insufficient documentation

## 2014-12-22 DIAGNOSIS — G8929 Other chronic pain: Secondary | ICD-10-CM | POA: Insufficient documentation

## 2014-12-22 DIAGNOSIS — G4733 Obstructive sleep apnea (adult) (pediatric): Secondary | ICD-10-CM | POA: Insufficient documentation

## 2014-12-22 DIAGNOSIS — I10 Essential (primary) hypertension: Secondary | ICD-10-CM | POA: Insufficient documentation

## 2014-12-22 DIAGNOSIS — E039 Hypothyroidism, unspecified: Secondary | ICD-10-CM | POA: Insufficient documentation

## 2014-12-22 DIAGNOSIS — F329 Major depressive disorder, single episode, unspecified: Secondary | ICD-10-CM | POA: Insufficient documentation

## 2014-12-22 DIAGNOSIS — Z8673 Personal history of transient ischemic attack (TIA), and cerebral infarction without residual deficits: Secondary | ICD-10-CM | POA: Insufficient documentation

## 2014-12-22 DIAGNOSIS — Z79899 Other long term (current) drug therapy: Secondary | ICD-10-CM | POA: Insufficient documentation

## 2014-12-22 DIAGNOSIS — Z720 Tobacco use: Secondary | ICD-10-CM | POA: Insufficient documentation

## 2014-12-22 DIAGNOSIS — Z87448 Personal history of other diseases of urinary system: Secondary | ICD-10-CM | POA: Insufficient documentation

## 2014-12-22 DIAGNOSIS — Z8719 Personal history of other diseases of the digestive system: Secondary | ICD-10-CM | POA: Insufficient documentation

## 2014-12-22 DIAGNOSIS — Z008 Encounter for other general examination: Secondary | ICD-10-CM | POA: Insufficient documentation

## 2014-12-22 DIAGNOSIS — Z880 Allergy status to penicillin: Secondary | ICD-10-CM | POA: Insufficient documentation

## 2014-12-22 DIAGNOSIS — E785 Hyperlipidemia, unspecified: Secondary | ICD-10-CM | POA: Insufficient documentation

## 2014-12-22 DIAGNOSIS — Z87828 Personal history of other (healed) physical injury and trauma: Secondary | ICD-10-CM | POA: Insufficient documentation

## 2014-12-22 DIAGNOSIS — E669 Obesity, unspecified: Secondary | ICD-10-CM | POA: Insufficient documentation

## 2014-12-22 DIAGNOSIS — M1990 Unspecified osteoarthritis, unspecified site: Secondary | ICD-10-CM | POA: Insufficient documentation

## 2014-12-22 LAB — URINALYSIS, ROUTINE W REFLEX MICROSCOPIC
Bilirubin Urine: NEGATIVE
Glucose, UA: NEGATIVE mg/dL
KETONES UR: NEGATIVE mg/dL
Nitrite: NEGATIVE
Protein, ur: NEGATIVE mg/dL
Specific Gravity, Urine: 1.022 (ref 1.005–1.030)
UROBILINOGEN UA: 0.2 mg/dL (ref 0.0–1.0)
pH: 5.5 (ref 5.0–8.0)

## 2014-12-22 LAB — CBC WITH DIFFERENTIAL/PLATELET
BASOS ABS: 0.1 10*3/uL (ref 0.0–0.1)
Basophils Relative: 0 % (ref 0–1)
Eosinophils Absolute: 0.1 10*3/uL (ref 0.0–0.7)
Eosinophils Relative: 1 % (ref 0–5)
HCT: 45.9 % (ref 36.0–46.0)
HEMOGLOBIN: 15.7 g/dL — AB (ref 12.0–15.0)
Lymphocytes Relative: 20 % (ref 12–46)
Lymphs Abs: 2.8 10*3/uL (ref 0.7–4.0)
MCH: 30.3 pg (ref 26.0–34.0)
MCHC: 34.2 g/dL (ref 30.0–36.0)
MCV: 88.6 fL (ref 78.0–100.0)
Monocytes Absolute: 1.2 10*3/uL — ABNORMAL HIGH (ref 0.1–1.0)
Monocytes Relative: 8 % (ref 3–12)
Neutro Abs: 9.7 10*3/uL — ABNORMAL HIGH (ref 1.7–7.7)
Neutrophils Relative %: 71 % (ref 43–77)
Platelets: 319 10*3/uL (ref 150–400)
RBC: 5.18 MIL/uL — AB (ref 3.87–5.11)
RDW: 14.1 % (ref 11.5–15.5)
WBC: 13.8 10*3/uL — ABNORMAL HIGH (ref 4.0–10.5)

## 2014-12-22 LAB — ACETAMINOPHEN LEVEL

## 2014-12-22 LAB — URINE MICROSCOPIC-ADD ON

## 2014-12-22 LAB — COMPREHENSIVE METABOLIC PANEL
ALBUMIN: 3.5 g/dL (ref 3.5–5.0)
ALT: 147 U/L — ABNORMAL HIGH (ref 14–54)
AST: 28 U/L (ref 15–41)
Alkaline Phosphatase: 84 U/L (ref 38–126)
Anion gap: 11 (ref 5–15)
BUN: 20 mg/dL (ref 6–20)
CHLORIDE: 102 mmol/L (ref 101–111)
CO2: 24 mmol/L (ref 22–32)
Calcium: 9.7 mg/dL (ref 8.9–10.3)
Creatinine, Ser: 1.04 mg/dL — ABNORMAL HIGH (ref 0.44–1.00)
GFR calc non Af Amer: 53 mL/min — ABNORMAL LOW (ref >60)
GLUCOSE: 136 mg/dL — AB (ref 65–99)
POTASSIUM: 3.6 mmol/L (ref 3.5–5.1)
SODIUM: 137 mmol/L (ref 135–145)
Total Bilirubin: 0.5 mg/dL (ref 0.3–1.2)
Total Protein: 7.5 g/dL (ref 6.5–8.1)

## 2014-12-22 LAB — SALICYLATE LEVEL

## 2014-12-22 MED ORDER — ACETAMINOPHEN 325 MG PO TABS: 650.0000 mg | ORAL_TABLET | Freq: Four times a day (QID) | ORAL | Status: DC | PRN

## 2014-12-22 MED ORDER — LEVOTHYROXINE SODIUM 200 MCG PO TABS
200.0000 ug | ORAL_TABLET | Freq: Every day | ORAL | Status: DC
  Filled 2014-12-22 (×2): qty 1

## 2014-12-22 MED ORDER — CLONAZEPAM 0.5 MG PO TABS
1.0000 mg | ORAL_TABLET | Freq: Every day | ORAL | Status: DC
  Administered 2014-12-23: 1 mg via ORAL
  Filled 2014-12-22: qty 2

## 2014-12-22 MED ORDER — PANTOPRAZOLE SODIUM 40 MG PO TBEC
40.0000 mg | DELAYED_RELEASE_TABLET | Freq: Two times a day (BID) | ORAL | Status: DC
  Administered 2014-12-23: 40 mg via ORAL
  Filled 2014-12-22: qty 1

## 2014-12-22 MED ORDER — DIPHENOXYLATE-ATROPINE 2.5-0.025 MG PO TABS: 2.0000 | ORAL_TABLET | Freq: Four times a day (QID) | ORAL | Status: DC | PRN

## 2014-12-22 MED ORDER — CLOPIDOGREL BISULFATE 75 MG PO TABS: 75.0000 mg | ORAL_TABLET | Freq: Every day | ORAL | Status: DC

## 2014-12-22 MED ORDER — BUPROPION HCL ER (XL) 300 MG PO TB24
300.0000 mg | ORAL_TABLET | Freq: Every day | ORAL | Status: DC
  Filled 2014-12-22: qty 1

## 2014-12-22 MED ORDER — LOSARTAN POTASSIUM 50 MG PO TABS
100.0000 mg | ORAL_TABLET | Freq: Every day | ORAL | Status: DC
  Filled 2014-12-22: qty 2

## 2014-12-22 MED ORDER — OXYCODONE HCL 5 MG PO TABS
2.5000 mg | ORAL_TABLET | ORAL | Status: DC | PRN
  Administered 2014-12-23: 2.5 mg via ORAL
  Filled 2014-12-22: qty 1

## 2014-12-22 MED ORDER — CELECOXIB 200 MG PO CAPS
200.0000 mg | ORAL_CAPSULE | Freq: Every day | ORAL | Status: DC
  Filled 2014-12-22: qty 1

## 2014-12-22 MED ORDER — VITAMIN D 1000 UNITS PO TABS
4000.0000 [IU] | ORAL_TABLET | Freq: Two times a day (BID) | ORAL | Status: DC
  Administered 2014-12-23: 4000 [IU] via ORAL
  Filled 2014-12-22: qty 4

## 2014-12-22 MED ORDER — ATORVASTATIN CALCIUM 20 MG PO TABS
20.0000 mg | ORAL_TABLET | Freq: Every day | ORAL | Status: DC
  Filled 2014-12-22: qty 1

## 2014-12-22 MED ORDER — ONDANSETRON HCL 4 MG PO TABS: 4.0000 mg | ORAL_TABLET | Freq: Three times a day (TID) | ORAL | Status: DC | PRN

## 2014-12-22 MED ORDER — AMLODIPINE BESYLATE 5 MG PO TABS: 5.0000 mg | ORAL_TABLET | Freq: Every day | ORAL | Status: DC

## 2014-12-22 MED ORDER — GABAPENTIN 100 MG PO CAPS
100.0000 mg | ORAL_CAPSULE | Freq: Three times a day (TID) | ORAL | Status: DC
  Administered 2014-12-23: 100 mg via ORAL
  Filled 2014-12-22 (×5): qty 1

## 2014-12-22 MED ORDER — DICLOFENAC SODIUM 1 % TD GEL: 2.0000 g | Freq: Three times a day (TID) | TRANSDERMAL | Status: DC | PRN

## 2014-12-22 MED ORDER — ASPIRIN EC 81 MG PO TBEC
81.0000 mg | DELAYED_RELEASE_TABLET | Freq: Every day | ORAL | Status: DC
Start: 2014-12-23 — End: 2014-12-23

## 2014-12-22 MED ORDER — UMECLIDINIUM-VILANTEROL 62.5-25 MCG/INH IN AEPB: 1.0000 "application " | INHALATION_SPRAY | Freq: Every day | RESPIRATORY_TRACT | Status: DC

## 2014-12-22 MED ORDER — CLOPIDOGREL BISULFATE 75 MG PO TABS: 75.0000 mg | ORAL_TABLET | Freq: Every day | ORAL | Status: AC

## 2014-12-22 MED ORDER — METFORMIN HCL 500 MG PO TABS: 500.0000 mg | ORAL_TABLET | Freq: Two times a day (BID) | ORAL | Status: AC

## 2014-12-22 MED ORDER — ACETAMINOPHEN 325 MG PO TABS: 650.0000 mg | ORAL_TABLET | Freq: Four times a day (QID) | ORAL | Status: AC | PRN

## 2014-12-22 MED ORDER — BACLOFEN 10 MG PO TABS
10.0000 mg | ORAL_TABLET | Freq: Two times a day (BID) | ORAL | Status: DC | PRN
  Filled 2014-12-22: qty 1

## 2014-12-22 MED ORDER — VITAMIN B-12 1000 MCG PO TABS
1000.0000 ug | ORAL_TABLET | Freq: Every day | ORAL | Status: DC
  Filled 2014-12-22: qty 1

## 2014-12-22 MED ORDER — CELECOXIB 200 MG PO CAPS
200.0000 mg | ORAL_CAPSULE | Freq: Every day | ORAL | Status: DC
Start: 2014-12-22 — End: 2015-01-23

## 2014-12-22 MED ORDER — OXYCODONE HCL 5 MG PO TABS: 2.5000 mg | ORAL_TABLET | ORAL | Status: DC | PRN

## 2014-12-22 MED ORDER — ALUM & MAG HYDROXIDE-SIMETH 200-200-20 MG/5ML PO SUSP: 30.0000 mL | ORAL | Status: DC | PRN

## 2014-12-22 MED ORDER — NICOTINE 7 MG/24HR TD PT24: 7.0000 mg | MEDICATED_PATCH | Freq: Every day | TRANSDERMAL | Status: DC

## 2014-12-22 MED ORDER — FLUOXETINE HCL 20 MG PO CAPS
40.0000 mg | ORAL_CAPSULE | Freq: Every day | ORAL | Status: DC
  Filled 2014-12-22: qty 2

## 2014-12-22 MED ORDER — METFORMIN HCL 500 MG PO TABS: 500.0000 mg | ORAL_TABLET | Freq: Two times a day (BID) | ORAL | Status: DC

## 2014-12-22 MED ORDER — GABAPENTIN 100 MG PO CAPS: 100.0000 mg | ORAL_CAPSULE | Freq: Three times a day (TID) | ORAL | Status: AC

## 2014-12-22 MED ORDER — NICOTINE 7 MG/24HR TD PT24
7.0000 mg | MEDICATED_PATCH | Freq: Every day | TRANSDERMAL | Status: DC
  Filled 2014-12-22: qty 1

## 2014-12-22 MED ORDER — ASPIRIN 81 MG PO TBEC: 81.0000 mg | DELAYED_RELEASE_TABLET | Freq: Every day | ORAL | Status: AC

## 2014-12-22 MED ORDER — CLONAZEPAM 1 MG PO TABS: 1.0000 mg | ORAL_TABLET | Freq: Every day | ORAL | Status: DC

## 2014-12-22 NOTE — Progress Notes (Signed)
PT Cancellation Note  Patient Details Name: Kenidi Elenbaas MRN: [id number] DOB: 04/11/1941   Cancelled Treatment:    Reason Eval/Treat Not Completed: Patient declined, no reason specified Encouraged patient to work with therapy today however she declines and is upset about discharge to SNF. Daughter present and discussing reason for need of continued care at d/c to restore patients functional independence prior to returning home. Will continue to follow and work with patient as she is willing.  Berton Mount 12/22/2014, 4:17 PM Sunday [place], [place] [phone]

## 2014-12-22 NOTE — ED Provider Notes (Signed)
CSN: [id number]     Arrival date & time 12/22/14  2112 History   First MD Initiated Contact with Patient 12/22/14 2114     Chief Complaint  Patient presents with  . Psychiatric Evaluation     (Consider location/radiation/quality/duration/timing/severity/associated sxs/prior Treatment) HPI   Blood pressure 122/78, pulse 82, temperature 98.5 F (36.9 C), temperature source Oral, resp. rate 16, SpO2 95 %.  [name] [name] is a 74 y.o. female sent from rehabilitation facility for suicidal verbalizations. Patient recently had a stroke, this was the first day at her rehabilitation facility when she was informed that she would have to have thinned fluid diet she said "oh just kill me." Patient said she went to get up and get some water and the staff there thought that she was running away. She states that she has no suicidal intentions, she has no prior history of suicide attempt. She states that the situation was "a molehill made into a mountain." She denies homicidal ideation, auditory or visual hallucinations, drug or alcohol abuse, chest pain, shortness of breath, worsening weakness or ataxia.   Past Medical History  Diagnosis Date  . HTN (hypertension)   . Ascending aortic aneurysm   . Dyslipidemia   . Tobacco dependence   . Hypothyroidism   . Depression   . Neurogenic bladder   . Obesity   . Dog bite(E906.0)     RLL with systemic inflammatory response  . Chronic low back pain   . Anxiety   . MS (multiple sclerosis)     Followed by Dr. Sandria Manly  . Arthritis   . History of ulcer disease 30 yrs ago  . IBS (irritable bowel syndrome)   . Incontinence in female   . Obstructive sleep apnea     a. compliant with CPAP   Past Surgical History  Procedure Laterality Date  . Cholecystectomy    . Hysterectomy    . Nasal sinus surgery    . Left eye surgery    . Bilateral foot surgery    . Leg surgery  2006    due to fx leg   . Back surgery    . Total knee arthroplasty Left 04/11/2014     Procedure: LEFT TOTAL KNEE ARTHROPLASTY;  Surgeon: Shelda Pal, MD;  Location: [facility];  Service: Orthopedics;  Laterality: Left;  . Tee without cardioversion N/A 12/21/2014    Procedure: TRANSESOPHAGEAL ECHOCARDIOGRAM (TEE);  Surgeon: Thurmon Fair, MD;  Location: [facility] ENDOSCOPY;  Service: Cardiovascular;  Laterality: N/A;   Family History  Problem Relation Age of Onset  . Cancer Father   . Emphysema Father    History  Substance Use Topics  . Smoking status: Current Every Day Smoker -- 0.50 packs/day for 30 years    Types: Cigarettes  . Smokeless tobacco: Never Used  . Alcohol Use: Yes     Comment: Rare alcohol use   OB History    No data available     Review of Systems  10 systems reviewed and found to be negative, except as noted in the HPI.  Allergies  Codeine; Lisinopril; Demerol; and Penicillins  Home Medications   Prior to Admission medications   Medication Sig Start Date End Date Taking? Authorizing Provider  acetaminophen (TYLENOL) 325 MG tablet Take 2 tablets (650 mg total) by mouth every 6 (six) hours as needed for mild pain (or Fever >/= 101). 12/22/14  Yes Osvaldo Shipper, MD  amLODipine (NORVASC) 5 MG tablet Take 5 mg by mouth daily.  Yes Historical Provider, MD  aspirin EC 81 MG EC tablet Take 1 tablet (81 mg total) by mouth daily. For 3 months. And then Plavix alone. 12/22/14  Yes Osvaldo Shipper, MD  atorvastatin (LIPITOR) 20 MG tablet Take 20 mg by mouth daily.   Yes Historical Provider, MD  baclofen (LIORESAL) 10 MG tablet Take 10 mg by mouth 2 (two) times daily as needed for muscle spasms.   Yes Historical Provider, MD  buPROPion (WELLBUTRIN XL) 300 MG 24 hr tablet Take 1 tablet by mouth daily.  01/17/14  Yes Historical Provider, MD  celecoxib (CELEBREX) 200 MG capsule Take 1 capsule (200 mg total) by mouth daily. 12/22/14  Yes Osvaldo Shipper, MD  Cholecalciferol (VITAMIN D) 2000 UNITS tablet Take 4,000 Units by mouth 2 (two) times daily.    Yes Historical  Provider, MD  clonazePAM (KLONOPIN) 1 MG tablet Take 1 tablet (1 mg total) by mouth at bedtime. 12/22/14  Yes Osvaldo Shipper, MD  clopidogrel (PLAVIX) 75 MG tablet Take 1 tablet (75 mg total) by mouth daily. 12/22/14  Yes Osvaldo Shipper, MD  Cyanocobalamin (VITAMIN B 12 PO) Take 20,000-25,000 mcg by mouth every morning.   Yes Historical Provider, MD  diclofenac sodium (VOLTAREN) 1 % GEL Apply 2 g topically every 8 (eight) hours as needed (pain).   Yes Historical Provider, MD  diphenoxylate-atropine (LOMOTIL) 2.5-0.025 MG per tablet Take 2 tablets by mouth 4 (four) times daily as needed for diarrhea or loose stools.    Yes Historical Provider, MD  FLUoxetine (PROZAC) 40 MG capsule Take 40 mg by mouth every morning.    Yes Historical Provider, MD  gabapentin (NEURONTIN) 100 MG capsule Take 1 capsule (100 mg total) by mouth every 8 (eight) hours. 12/22/14  Yes Osvaldo Shipper, MD  levothyroxine (SYNTHROID, LEVOTHROID) 200 MCG tablet Take 200 mcg by mouth daily before breakfast.   Yes Historical Provider, MD  losartan (COZAAR) 100 MG tablet Take 1 tablet by mouth every morning.  10/15/11  Yes Historical Provider, MD  metFORMIN (GLUCOPHAGE) 500 MG tablet Take 1 tablet (500 mg total) by mouth 2 (two) times daily with a meal. 12/22/14  Yes Osvaldo Shipper, MD  nicotine (NICODERM CQ - DOSED IN MG/24 HR) 7 mg/24hr patch Place 1 patch (7 mg total) onto the skin daily. 12/22/14  Yes Osvaldo Shipper, MD  oxyCODONE (OXY IR/ROXICODONE) 5 MG immediate release tablet Take 0.5 tablets (2.5 mg total) by mouth every 4 (four) hours as needed for moderate pain. 12/22/14  Yes Osvaldo Shipper, MD  pantoprazole (PROTONIX) 40 MG tablet Take 40 mg by mouth 2 (two) times daily.   Yes Historical Provider, MD  Umeclidinium-Vilanterol (ANORO ELLIPTA) 62.5-25 MCG/INH AEPB Inhale 1 application into the lungs daily.     Historical Provider, MD   BP 122/78 mmHg  Pulse 82  Temp(Src) 98.5 F (36.9 C) (Oral)  Resp 16  SpO2 95% Physical Exam   Constitutional: She is oriented to person, place, and time. She appears well-developed and well-nourished. No distress.  HENT:  Head: Normocephalic and atraumatic.  Mouth/Throat: Oropharynx is clear and moist.  Eyes: Conjunctivae and EOM are normal. Pupils are equal, round, and reactive to light.  Neck: Normal range of motion.  Cardiovascular: Normal rate, regular rhythm and intact distal pulses.   Pulmonary/Chest: Effort normal and breath sounds normal. No stridor.  Abdominal: Soft. There is no tenderness.  Musculoskeletal: Normal range of motion.  Neurological: She is alert and oriented to person, place, and time.  Skin: She is  not diaphoretic.  Psychiatric: She has a normal mood and affect. Thought content is not paranoid. She expresses no homicidal and no suicidal ideation.  Nursing note and vitals reviewed.   ED Course  Procedures (including critical care time) Labs Review Labs Reviewed  URINALYSIS, ROUTINE W REFLEX MICROSCOPIC (NOT AT [facility]) - Abnormal; Notable for the following:    APPearance CLOUDY (*)    Hgb urine dipstick TRACE (*)    Leukocytes, UA SMALL (*)    All other components within normal limits  CBC WITH DIFFERENTIAL/PLATELET - Abnormal; Notable for the following:    WBC 13.8 (*)    RBC 5.18 (*)    Hemoglobin 15.7 (*)    Neutro Abs 9.7 (*)    Monocytes Absolute 1.2 (*)    All other components within normal limits  COMPREHENSIVE METABOLIC PANEL - Abnormal; Notable for the following:    Glucose, Bld 136 (*)    Creatinine, Ser 1.04 (*)    ALT 147 (*)    GFR calc non Af Amer 53 (*)    All other components within normal limits  ACETAMINOPHEN LEVEL - Abnormal; Notable for the following:    Acetaminophen (Tylenol), Serum <10 (*)    All other components within normal limits  URINE MICROSCOPIC-ADD ON - Abnormal; Notable for the following:    Squamous Epithelial / LPF MANY (*)    Crystals URIC ACID CRYSTALS (*)    All other components within normal limits   SALICYLATE LEVEL    Imaging Review Mr Card Morphology Wo/w Cm  12/21/2014   CLINICAL DATA:  74 year old female post stroke with an incidental finding of a mass in the ascending aorta.  EXAM: CARDIAC MRI  TECHNIQUE: The patient was scanned on a 1.5 Tesla GE magnet. A dedicated cardiac coil was used. Functional imaging was done using Fiesta sequences. 2,3, and 4 chamber views were done to assess for RWMA's. Modified Simpson's rule using a short axis stack was used to calculate an ejection fraction on a dedicated work [employer]. The patient received 24 cc of Multihance. After 10 minutes inversion recovery sequences were used to assess for infiltration and scar tissue.  CONTRAST:  24 cc  of Multihance  FINDINGS: 1. There is a small mass attached to the anterior wall of the ascending aorta measuring 9 x 8 mm. The mass is located just 4 mm above RCA ostium and 24 mm from the aortic annulus.  There is no late gadolinium enhancement in the mass with regular and long TI times. There are no signs of invasion into the wall of the ascending aorta. No other masses are seen.  2. 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  Aortic root has normal size measuring 34 x 33 x 33 mm.  Aortic arch, descending thoracic aorta and pulmonary artery have normal size.  IMPRESSION: 1. A small mass attached to the anterior wall of the ascending aorta measuring 9 x 8 mm is located just above RCA ostium with no signs of invasion into the vessel wall. No other masses were seen.  No late gadolinium enhancement in present in the mass.  Collectively, these findings are consistent with a small thrombus.  2. There is mild aneurysmal dilatation of the ascending aorta measuring 43 x 41 mm. A repeat scan (CTA or MRA) is recommended in 1 year.  Tobias Alexander   Electronically Signed   By: Tobias Alexander   On: 12/21/2014 19:01  EKG Interpretation None      MDM   Final diagnoses:  None     Filed Vitals:   12/22/14 2120  BP: 122/78  Pulse: 82  Temp: 98.5 F (36.9 C)  TempSrc: Oral  Resp: 16  SpO2: 95%    Medications  acetaminophen (TYLENOL) tablet 650 mg (not administered)  amLODipine (NORVASC) tablet 5 mg (not administered)  aspirin EC tablet 81 mg (not administered)  atorvastatin (LIPITOR) tablet 20 mg (not administered)  baclofen (LIORESAL) tablet 10 mg (not administered)  buPROPion (WELLBUTRIN XL) 24 hr tablet 300 mg (not administered)  celecoxib (CELEBREX) capsule 200 mg (not administered)  Vitamin D 4,000 Units (not administered)  clonazePAM (KLONOPIN) tablet 1 mg (not administered)  clopidogrel (PLAVIX) tablet 75 mg (not administered)  Vitamin B 12 LOZG 20,000-25,000 mcg (not administered)  diclofenac sodium (VOLTAREN) 1 % transdermal gel 2 g (not administered)  diphenoxylate-atropine (LOMOTIL) 2.5-0.025 MG per tablet 2 tablet (not administered)  FLUoxetine (PROZAC) capsule 40 mg (not administered)  gabapentin (NEURONTIN) capsule 100 mg (not administered)  levothyroxine (SYNTHROID, LEVOTHROID) tablet 200 mcg (not administered)  losartan (COZAAR) tablet 100 mg (not administered)  metFORMIN (GLUCOPHAGE) tablet 500 mg (not administered)  nicotine (NICODERM CQ - dosed in mg/24 hr) patch 7 mg (not administered)  oxyCODONE (Oxy IR/ROXICODONE) immediate release tablet 2.5 mg (not administered)  pantoprazole (PROTONIX) EC tablet 40 mg (not administered)  Umeclidinium-Vilanterol 62.5-25 MCG/INH AEPB 1 application (not administered)  alum & mag hydroxide-simeth (MAALOX/MYLANTA) 200-200-20 MG/5ML suspension 30 mL (not administered)  ondansetron (ZOFRAN) tablet 4 mg (not administered)    [name] [name] is a pleasant 74 y.o. female evaluation of suicidal comments. Patient recently had a stroke, she went into rehabilitation and needs some vague suicidal comments. Patient denies any suicidal ideation.  Patient is medically cleared for psychiatric evaluation will be  transferred to the psych ED. TTS consulted, home meds and psych standard holding orders placed.     Wynetta Emery, PA-C 12/22/14 2337  Lorre Nick, MD 12/26/14 1046

## 2014-12-22 NOTE — Progress Notes (Signed)
Speech Language Pathology Treatment: Dysphagia  Patient Details Name: [name] [name] MRN: [id number] DOB: 1940/06/26 Today's Date: [name] Time: 4098-1191 SLP Time Calculation (min) (ACUTE ONLY): 22 min  Assessment / Plan / Recommendation Clinical Impression  Pt with persisting mild dysphagia, limited awareness of deficits s/p right CVA.  Requires mod verbal cues to intermittently check left oral cavity for pocketing/pill retention.  Observed consuming meds with no s/s of difficulty when taken whole in puree. Nectar liquids consumed safely, but trials of thin liquids continue to elicit consistent s/s of aspiration.  Recommend continued Dysphagia 3, nectar-thick liquids for now.  Reviewed precautions with pt.    HPI Other Pertinent Information: [name] [name] is a 74 y.o. female with a history of HTN, Aortic Aneurysm, Dyslipidemia, and Hypothyroid who presented to the ED 12/16/14 with complaints of sharp intermittent left sided chest pain. She also had a fall without injury. She was seen in the ED and discharged but returned after suffering a syncopal episode and was referred for medical admission. MRI revealed Right MCA infarcts.   Pertinent Vitals Pain Assessment: No/denies pain  SLP Plan  Continue with current plan of care    Recommendations Diet recommendations: Dysphagia 3 (mechanical soft);Nectar-thick liquid Liquids provided via: Cup Medication Administration: Whole meds with puree Supervision: Full supervision/cueing for compensatory strategies Compensations: Slow rate;Small sips/bites;Check for pocketing;Check for anterior loss Postural Changes and/or Swallow Maneuvers: Seated upright 90 degrees;Upright 30-60 min after meal              Follow up Recommendations: Skilled Nursing facility Plan: Continue with current plan of care   [name] [name] L. [name] [name], [place] CCC/SLP Pager [phone]      [name] [name] [name] [name], 11:32 AM

## 2014-12-22 NOTE — Clinical Social Work Placement (Signed)
   CLINICAL SOCIAL WORK PLACEMENT  NOTE  Date:  12/22/2014  Patient Details  Name: [name] [name] MRN: [id number] Date of Birth: 05-03-1941  Clinical Social Work is seeking post-discharge placement for this patient at the Skilled  Nursing Facility level of care (*CSW will initial, date and re-position this form in  chart as items are completed):  Yes   Patient/family provided with [facility] Clinical Social Work Department's list of facilities offering this level of care within the geographic area requested by the patient (or if unable, by the patient's family).  Yes   Patient/family informed of their freedom to choose among providers that offer the needed level of care, that participate in Medicare, Medicaid or managed care program needed by the patient, have an available bed and are willing to accept the patient.  Yes   Patient/family informed of [place]'s ownership interest in [facility] and [facility], as well as of the fact that they are under no obligation to receive care at these facilities.  PASRR submitted to EDS on       PASRR number received on       Existing PASRR number confirmed on 12/22/14     FL2 transmitted to all facilities in geographic area requested by pt/family on 12/22/14     FL2 transmitted to all facilities within larger geographic area on       Patient informed that his/her managed care company has contracts with or will negotiate with certain facilities, including the following:   (yes)     Yes   Patient/family informed of bed offers received.  Patient chooses bed at  [facility])     Physician recommends and patient chooses bed at      Patient to be transferred to  [facility]) on 12/22/14.  Patient to be transferred to facility by  [facility] )     Patient family notified on 12/22/14 of transfer.  Name of family member notified:   (Pt's dtr, Hollie Salk )     PHYSICIAN       Additional Comment:     _______________________________________________ Derenda Fennel, MSW, LCSWA [phone] 12/22/2014 1:30 PM

## 2014-12-22 NOTE — Clinical Social Work Note (Signed)
Clinical Social Worker facilitated patient discharge including contacting patient family and facility to confirm patient discharge plans.  Clinical information faxed to facility and family agreeable with plan.  CSW arranged ambulance transport via [facility] to [facility] .  RN to call report prior to discharge.  DC packet prepared and on chart for transport with number for report.   Clinical Social Worker will sign off for now as social work intervention is no longer needed. Please consult [place] again if new need arises.  Derenda Fennel, MSW, LCSWA [phone] 12/22/2014 1:30 PM

## 2014-12-22 NOTE — ED Notes (Signed)
Telepsych in room and talking with pt.

## 2014-12-22 NOTE — ED Notes (Signed)
EMS also reported that the pt is on a pureed diet, and that the staff at [facility] brought in the pt "whole food" and then had to take it away because of the diet restriction and the pt stated "oh well fine then I just want eat anything"

## 2014-12-22 NOTE — ED Notes (Signed)
Upon assessment in the room when the pt asked if she was suicidal she said "no I am not having any thoughts of harming myself, the staff at [facility] just got over excited" Pt is calm and states "I have no anxiety"

## 2014-12-22 NOTE — ED Notes (Signed)
Per [facility], pt form [name] [name], pt was d/c from here today for stroke and went to [facility] [name]. Per the staff the pt is refusing to take her meds "so i can die" pt has concerns about bills piling up at home while she's at home. Pt withdrawn with [facility]. Pt has DNR. Daughter was on scene and talking down to patient. Pt did not admit to being suicidal with [facility]. No IVC papers. Pt here for psychological eval. Pt has hx of suicidal threats, ptsd per the Daughter but there is no documentation of this. NO hx of depression or anxiety. Pt is stable and in NAD. VS 140 palpated BP, 88 pulse, 97% RA, 18 RR.

## 2014-12-22 NOTE — Progress Notes (Signed)
STROKE TEAM PROGRESS NOTE   SUBJECTIVE (INTERVAL HISTORY) No family is at the bedside. Patient had cardiac MRI which confirmed aortic plaque and thrombus.   OBJECTIVE Temp:  [98.2 F (36.8 C)-98.6 F (37 C)] 98.5 F (36.9 C) (06/30 1300) Pulse Rate:  [66-90] 81 (06/30 1300) Cardiac Rhythm:  [-]  Resp:  [16-19] 18 (06/30 1300) BP: (104-154)/(56-87) 145/87 mmHg (06/30 1300) SpO2:  [93 %-98 %] 97 % (06/30 1300)   Recent Labs Lab 12/16/14 2107  GLUCAP 135*    Recent Labs Lab 12/16/14 1546 12/17/14 0244  NA 139 140  K 4.8 3.9  CL 103 105  CO2 27 26  GLUCOSE 102* 121*  BUN 24* 21*  CREATININE 1.01* 0.91  CALCIUM 9.2 9.1    Recent Labs Lab 12/16/14 1546  AST 123*  ALT 81*  ALKPHOS 71  BILITOT 1.8*  PROT 7.4  ALBUMIN 3.9    Recent Labs Lab 12/16/14 1622 12/17/14 0244  WBC 13.9* 11.1*  NEUTROABS 9.8*  --   HGB 15.1* 14.8  HCT 46.3* 44.1  MCV 89.7 90.2  PLT 322 313    Recent Labs Lab 12/16/14 1546 12/17/14 0244 12/17/14 0825 12/17/14 1433  TROPONINI <0.03 <0.03 <0.03 <0.03   No results for input(s): LABPROT, INR in the last 72 hours. No results for input(s): COLORURINE, LABSPEC, PHURINE, GLUCOSEU, HGBUR, BILIRUBINUR, KETONESUR, PROTEINUR, UROBILINOGEN, NITRITE, LEUKOCYTESUR in the last 72 hours.  Invalid input(s): APPERANCEUR     Component Value Date/Time   CHOL 163 12/19/2014 0340   TRIG 76 12/19/2014 0340   HDL 52 12/19/2014 0340   CHOLHDL 3.1 12/19/2014 0340   VLDL 15 12/19/2014 0340   LDLCALC 96 12/19/2014 0340   Lab Results  Component Value Date   HGBA1C 6.5* 12/20/2014      Component Value Date/Time   LABOPIA POSITIVE* 11/27/2014 1835   COCAINSCRNUR NONE DETECTED 11/27/2014 1835   LABBENZ NONE DETECTED 11/27/2014 1835   AMPHETMU NONE DETECTED 11/27/2014 1835   THCU NONE DETECTED 11/27/2014 1835   LABBARB NONE DETECTED 11/27/2014 1835    No results for input(s): ETH in the last 168 hours.  Dg Chest 2 View  11/27/2014    CLINICAL DATA:  Mid chest pain and left arm pain.  EXAM: CHEST  2 VIEW  COMPARISON:  05/17/2014 and 04/05/2014  FINDINGS: The heart size and pulmonary vascularity are normal and the lungs are clear. Tortuosity and calcification of thoracic aorta. No osseous abnormality.  IMPRESSION: No acute abnormality.  No change since the prior exams.   Electronically Signed   By: Francene Boyers M.D.   On: 11/27/2014 19:19   Dg Forearm Left  12/17/2014   CLINICAL DATA:  Larey Seat yesterday.  Pain in the left elbow.  EXAM: LEFT FOREARM - 2 VIEW  COMPARISON:  None.  FINDINGS: Radiopaque density is identified along the anteromedial aspect of the forearm and may represent foreign body or object external to the patient. There is no evidence for acute fracture or dislocation. No soft tissue gas.  IMPRESSION: Possible foreign body in the soft tissues of the forearm.  No fracture.  If there is focal pain in the elbow consider dedicated views of the elbow.   Electronically Signed   By: Norva Pavlov M.D.   On: 12/17/2014 14:45   Ct Head Wo Contrast  12/16/2014   CLINICAL DATA:  Status post fall, generalized weakness  EXAM: CT HEAD WITHOUT CONTRAST  TECHNIQUE: Contiguous axial images were obtained from the base of the skull  through the vertex without intravenous contrast.  COMPARISON:  05/17/2014  FINDINGS: No evidence of parenchymal hemorrhage or extra-axial fluid collection. No mass lesion, mass effect, or midline shift.  No CT evidence of acute infarction.  Subcortical white matter and periventricular small vessel ischemic changes. Mild intracranial atherosclerosis.  Cerebral volume is within normal limits.  No ventriculomegaly.  The visualized paranasal sinuses are essentially clear. The mastoid air cells are unopacified.  No evidence of calvarial fracture.  IMPRESSION: No evidence of acute intracranial abnormality.  Small vessel ischemic changes.   Electronically Signed   By: Charline Bills M.D.   On: 12/16/2014 23:15   Ct  Chest W Contrast  11/27/2014   CLINICAL DATA:  Mid chest pain, shortness of breath, nausea and abdominal pain. Symptoms for 1 day.  EXAM: CT CHEST, ABDOMEN, AND PELVIS WITH CONTRAST  TECHNIQUE: Multidetector CT imaging of the chest, abdomen and pelvis was performed following the standard protocol during bolus administration of intravenous contrast.  CONTRAST:  OMNIPAQUE IOHEXOL 300 MG/ML  SOLN  COMPARISON:  Chest radiographs earlier this day. CT 07/17/2012  FINDINGS: CT CHEST FINDINGS  There is prominence of the ascending aorta, maximal dimension, this is unchanged from prior exam. Transverse and descending thoracic aorta are normal in caliber. There is moderate atherosclerotic calcification of is unchanged. No dissection or para-aortic stranding. The heart size is normal. There are coronary artery calcifications. No filling defects in the central pulmonary arteries. There is no mediastinal or hilar adenopathy. No pleural or pericardial effusion. Lipomatous hypertrophy at the intra-atrial septum is noted.  Trace hypoventilatory change at the lung bases. No consolidation to suggest pneumonia. No pulmonary nodule or mass. The right lower lobe 4 mm nodule on prior exam is not seen.  There is an 8 mm nodule in the upper right breast. Retroareolar nodular density is unchanged from prior CT. No axillary adenopathy.  There are no acute or suspicious osseous abnormalities.  CT ABDOMEN AND PELVIS FINDINGS  Postcholecystectomy. There is biliary prominence, the common bile duct measures 2.1 cm in greatest dimension. There is intrahepatic biliary ductal dilatation, left greater than right. No calcified choledocholithiasis. There is no focal hepatic lesion. 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 Mild pancreatic atrophy without ductal dilatation or peripancreatic inflammatory change. 5 mm hypodensity in the upper spleen, unchanged from prior. The adrenal  glands are normal. Kidneys demonstrate symmetric enhancement and excretion without hydronephrosis or perinephric stranding. Simple cyst in the lower right kidney is again seen.  The stomach is physiologically distended. There are no dilated or thickened bowel loops. The appendix is not definitively identified, no inflammatory change in the right lower quadrant to suggest appendicitis. There is a moderate volume of colonic stool without colonic wall thickening. Mild diverticulosis of the distal colon without diverticulitis. No free air, free fluid, or intra-abdominal fluid collection.  No retroperitoneal adenopathy. Abdominal aorta is normal in caliber. There is moderate diffuse atherosclerosis of the abdominal aorta and its branches. No aneurysm.  Within the pelvis the urinary bladder is near completely decompressed, question of bladder wall thickening. Uterus is surgically absent. There is no adnexal mass. There is no pelvic free fluid.  There are no acute or suspicious osseous abnormalities. There is postsurgical change in the lower lumbar spine.  IMPRESSION: 1. No acute intrathoracic abnormality. 2. Progressive dilatation of the extrahepatic biliary tree postcholecystectomy. No calcified choledocholithiasis. Correlation with LFTs recommended. If there is clinical concern for  biliary obstruction, MRCP could be considered. 3. Question of bladder wall thickening versus nondistention. Correlation with urinalysis recommended. 4. There is unchanged prominence of the ascending thoracic aorta, 3.9 cm. This is unchanged over the course of 2 years. 5. Right breast 8 mm nodule centrally. Recommend up-to-date mammography. 6. Diverticulosis without diverticulitis. Unchanged common hepatic artery aneurysm.   Electronically Signed   By: Rubye Oaks M.D.   On: 11/27/2014 21:49   Mr Maxine Glenn Head Wo Contrast  12/18/2014   CLINICAL DATA:  Recent fall at home without head injury. No reported loss of consciousness.  EXAM: MRI  HEAD WITHOUT CONTRAST  MRA HEAD WITHOUT CONTRAST  TECHNIQUE: Multiplanar, multiecho pulse sequences of the brain and surrounding structures were obtained without intravenous contrast. Angiographic images of the head were obtained using MRA technique without contrast.  COMPARISON:  Head CT 12/16/2014 and MRI 09/18/2010  FINDINGS: MRI HEAD FINDINGS  The examination had to be discontinued prior to completion due to patient's inability to tolerate being in the scanner any longer and inability to remain motionless. Patient declined further imaging and medication for anxiolysis. Axial and coronal diffusion, sagittal T1, and axial T2 weighted images were obtained.  Anterior right temporal lobe is obscured on diffusion-weighted imaging due to susceptibility artifact. There are multiple patchy areas of acute infarction in the right MCA territory involving cortex and white matter of the posterior frontal lobe, parietal lobe, posterior temporal lobe, and insula/external capsule. There is associated mild cytotoxic edema without mass effect. No parenchymal hematoma is seen.  There is no evidence of mass, midline shift, or extra-axial fluid collection. Ventricles and sulci are within normal limits for age. Patchy and confluent T2 hyperintensities in the subcortical and deep cerebral white matter and in the pons are nonspecific but compatible with rather extensive chronic small vessel ischemic disease. Chronic ischemic changes are also noted in the thalami. Chronic lacunar infarcts are present in the centrum semiovale bilaterally and in the anterior limb of the left internal capsule.  Prior bilateral cataract extraction is noted. Small left sphenoid sinus mucous retention cyst is noted. Mastoid air cells are clear. Major intracranial vascular flow voids are preserved.  MRA HEAD FINDINGS  Images are mildly to moderately motion degraded.  The visualized distal vertebral arteries are patent and codominant. PICA and SCA origins are  patent. Basilar artery is patent and mildly tortuous without evidence of significant stenosis. Posterior communicating arteries are not clearly identified. P1 and proximal P2 segments are patent bilaterally without evidence of significant stenosis. More distal PCAs are not well evaluated.  Intracranial internal carotid arteries are patent without evidence of significant stenosis. ACAs are patent without evidence of proximal stenosis. M1 segments are patent without stenosis. M2 trunks are patent bilaterally. Evaluation of MCA branch vessels is limited due to degraded image quality, with suggestion of a slightly diminished number of more distal MCA branches on the left compared to the right. No intracranial aneurysm is identified.  IMPRESSION: 1. Motion degraded, incomplete examination as above. 2. Patchy right MCA territory acute infarcts. No evidence of hemorrhage. 3. Advanced chronic small vessel ischemic disease. 4. No evidence of major intracranial arterial occlusion or significant proximal stenosis. Suboptimal branch vessel evaluation.   Electronically Signed   By: Sebastian Ache   On: 12/18/2014 10:00   Mr Brain Wo Contrast  12/18/2014   CLINICAL DATA:  Recent fall at home without head injury. No reported loss of consciousness.  EXAM: MRI HEAD WITHOUT CONTRAST  MRA HEAD WITHOUT CONTRAST  TECHNIQUE: Multiplanar, multiecho pulse sequences of the brain and surrounding structures were obtained without intravenous contrast. Angiographic images of the head were obtained using MRA technique without contrast.  COMPARISON:  Head CT 12/16/2014 and MRI 09/18/2010  FINDINGS: MRI HEAD FINDINGS  The examination had to be discontinued prior to completion due to patient's inability to tolerate being in the scanner any longer and inability to remain motionless. Patient declined further imaging and medication for anxiolysis. Axial and coronal diffusion, sagittal T1, and axial T2 weighted images were obtained.  Anterior right  temporal lobe is obscured on diffusion-weighted imaging due to susceptibility artifact. There are multiple patchy areas of acute infarction in the right MCA territory involving cortex and white matter of the posterior frontal lobe, parietal lobe, posterior temporal lobe, and insula/external capsule. There is associated mild cytotoxic edema without mass effect. No parenchymal hematoma is seen.  There is no evidence of mass, midline shift, or extra-axial fluid collection. Ventricles and sulci are within normal limits for age. Patchy and confluent T2 hyperintensities in the subcortical and deep cerebral white matter and in the pons are nonspecific but compatible with rather extensive chronic small vessel ischemic disease. Chronic ischemic changes are also noted in the thalami. Chronic lacunar infarcts are present in the centrum semiovale bilaterally and in the anterior limb of the left internal capsule.  Prior bilateral cataract extraction is noted. Small left sphenoid sinus mucous retention cyst is noted. Mastoid air cells are clear. Major intracranial vascular flow voids are preserved.  MRA HEAD FINDINGS  Images are mildly to moderately motion degraded.  The visualized distal vertebral arteries are patent and codominant. PICA and SCA origins are patent. Basilar artery is patent and mildly tortuous without evidence of significant stenosis. Posterior communicating arteries are not clearly identified. P1 and proximal P2 segments are patent bilaterally without evidence of significant stenosis. More distal PCAs are not well evaluated.  Intracranial internal carotid arteries are patent without evidence of significant stenosis. ACAs are patent without evidence of proximal stenosis. M1 segments are patent without stenosis. M2 trunks are patent bilaterally. Evaluation of MCA branch vessels is limited due to degraded image quality, with suggestion of a slightly diminished number of more distal MCA branches on the left compared  to the right. No intracranial aneurysm is identified.  IMPRESSION: 1. Motion degraded, incomplete examination as above. 2. Patchy right MCA territory acute infarcts. No evidence of hemorrhage. 3. Advanced chronic small vessel ischemic disease. 4. No evidence of major intracranial arterial occlusion or significant proximal stenosis. Suboptimal branch vessel evaluation.   Electronically Signed   By: Sebastian Ache   On: 12/18/2014 10:00   Ct Abdomen Pelvis W Contrast  11/27/2014   CLINICAL DATA:  Mid chest pain, shortness of breath, nausea and abdominal pain. Symptoms for 1 day.  EXAM: CT CHEST, ABDOMEN, AND PELVIS WITH CONTRAST  TECHNIQUE: Multidetector CT imaging of the chest, abdomen and pelvis was performed following the standard protocol during bolus administration of intravenous contrast.  CONTRAST:  OMNIPAQUE IOHEXOL 300 MG/ML  SOLN  COMPARISON:  Chest radiographs earlier this day. CT 07/17/2012  FINDINGS: CT CHEST FINDINGS  There is prominence of the ascending aorta, maximal dimension, this is unchanged from prior exam. Transverse and descending thoracic aorta are normal in caliber. There is moderate atherosclerotic calcification of is unchanged. No dissection or para-aortic stranding. The heart size is normal. There are coronary artery calcifications. No filling defects in the central pulmonary arteries. There is no mediastinal or hilar adenopathy. No  pleural or pericardial effusion. Lipomatous hypertrophy at the intra-atrial septum is noted.  Trace hypoventilatory change at the lung bases. No consolidation to suggest pneumonia. No pulmonary nodule or mass. The right lower lobe 4 mm nodule on prior exam is not seen.  There is an 8 mm nodule in the upper right breast. Retroareolar nodular density is unchanged from prior CT. No axillary adenopathy.  There are no acute or suspicious osseous abnormalities.  CT ABDOMEN AND PELVIS FINDINGS  Postcholecystectomy. There is biliary prominence, the common bile  duct measures 2.1 cm in greatest dimension. There is intrahepatic biliary ductal dilatation, left greater than right. No calcified choledocholithiasis. There is no focal hepatic lesion. 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 Mild pancreatic atrophy without ductal dilatation or peripancreatic inflammatory change. 5 mm hypodensity in the upper spleen, unchanged from prior. The adrenal glands are normal. Kidneys demonstrate symmetric enhancement and excretion without hydronephrosis or perinephric stranding. Simple cyst in the lower right kidney is again seen.  The stomach is physiologically distended. There are no dilated or thickened bowel loops. The appendix is not definitively identified, no inflammatory change in the right lower quadrant to suggest appendicitis. There is a moderate volume of colonic stool without colonic wall thickening. Mild diverticulosis of the distal colon without diverticulitis. No free air, free fluid, or intra-abdominal fluid collection.  No retroperitoneal adenopathy. Abdominal aorta is normal in caliber. There is moderate diffuse atherosclerosis of the abdominal aorta and its branches. No aneurysm.  Within the pelvis the urinary bladder is near completely decompressed, question of bladder wall thickening. Uterus is surgically absent. There is no adnexal mass. There is no pelvic free fluid.  There are no acute or suspicious osseous abnormalities. There is postsurgical change in the lower lumbar spine.  IMPRESSION: 1. No acute intrathoracic abnormality. 2. Progressive dilatation of the extrahepatic biliary tree postcholecystectomy. No calcified choledocholithiasis. Correlation with LFTs recommended. If there is clinical concern for biliary obstruction, MRCP could be considered. 3. Question of bladder wall thickening versus nondistention. Correlation with urinalysis recommended. 4. There is unchanged prominence of the ascending  thoracic aorta, 3.9 cm. This is unchanged over the course of 2 years. 5. Right breast 8 mm nodule centrally. Recommend up-to-date mammography. 6. Diverticulosis without diverticulitis. Unchanged common hepatic artery aneurysm.   Electronically Signed   By: Rubye Oaks M.D.   On: 11/27/2014 21:49   Ct Elbow Left W/o Cm  12/17/2014   CLINICAL DATA:  Fall, left elbow pain  EXAM: CT OF THE LEFT ELBOW WITHOUT CONTRAST  TECHNIQUE: Multidetector CT imaging was performed according to the standard protocol. Multiplanar CT image reconstructions were also generated.  COMPARISON:  Forearm radiograph performed earlier today  FINDINGS: No fracture or dislocation.  No joint effusion.  IMPRESSION: Negative. No that the area of possible foreign body seen on radiograph is not included on this examination.   Electronically Signed   By: Esperanza Heir M.D.   On: 12/17/2014 21:29   Dg Chest Port 1 View  12/17/2014   CLINICAL DATA:  Fall, weakness  EXAM: PORTABLE CHEST - 1 VIEW  COMPARISON:  CT chest dated 11/27/2014  FINDINGS: Lungs are essentially clear.  No pleural effusion or pneumothorax.  Cardiomegaly.  IMPRESSION: No evidence of acute cardiopulmonary disease.   Electronically Signed   By: Charline Bills M.D.   On: 12/17/2014 00:12   Mr Card Morphology Wo/w Cm  12/21/2014   CLINICAL DATA:  74 year old female post stroke with an incidental finding of a mass in the ascending aorta.  EXAM: CARDIAC MRI  TECHNIQUE: The patient was scanned on a 1.5 Tesla GE magnet. A dedicated cardiac coil was used. Functional imaging was done using Fiesta sequences. 2,3, and 4 chamber views were done to assess for RWMA's. Modified Simpson's rule using a short axis stack was used to calculate an ejection fraction on a dedicated work [employer]. The patient received 24 cc of Multihance. After 10 minutes inversion recovery sequences were used to assess for infiltration and scar tissue.  CONTRAST:  24 cc  of Multihance   FINDINGS: 1. There is a small mass attached to the anterior wall of the ascending aorta measuring 9 x 8 mm. The mass is located just 4 mm above RCA ostium and 24 mm from the aortic annulus.  There is no late gadolinium enhancement in the mass with regular and long TI times. There are no signs of invasion into the wall of the ascending aorta. No other masses are seen.  2. 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  Aortic root has normal size measuring 34 x 33 x 33 mm.  Aortic arch, descending thoracic aorta and pulmonary artery have normal size.  IMPRESSION: 1. A small mass attached to the anterior wall of the ascending aorta measuring 9 x 8 mm is located just above RCA ostium with no signs of invasion into the vessel wall. No other masses were seen.  No late gadolinium enhancement in present in the mass.  Collectively, these findings are consistent with a small thrombus.  2. There is mild aneurysmal dilatation of the ascending aorta measuring 43 x 41 mm. A repeat scan (CTA or MRA) is recommended in 1 year.  Tobias Alexander   Electronically Signed   By: Tobias Alexander   On: 12/21/2014 19:01    Carotid Doppler  There is 1-39% bilateral ICA stenosis. Vertebral artery flow is antegrade.    TEE 10 x10 mm high mobile echodense mass is seen attached to the aortic wall at the level of the sinotubular junction, directly above the ostium of the right coronary artery. It appears to be associated with an atherosclerotic plaque Otherwise normal study (normal LV EF, small left atrium without thrombus, normal valves, no PFO or ASD). Incidental note of severe lipomatous hypertrophy of the atrial septum. Cardiac MRI : 1. A small mass attached to the anterior wall of the ascending aorta measuring 9 x 8 mm is located just above RCA ostium with no signs of invasion into the vessel wall. No other masses were seen.  No late gadolinium enhancement in present in the mass.  Collectively, these  findings are consistent with a small thrombus.  2. There is mild aneurysmal dilatation of the ascending aorta measuring 43 x 41 mm. A repeat scan (CTA or MRA) is recommended in 1 year.  PHYSICAL EXAM Pleasant elderly lady not in distress. . Afebrile. Head is nontraumatic. Neck is supple without bruit.    Cardiac exam no murmur or gallop. Lungs are clear to auscultation. Distal pulses are well felt. Neurological Exam :  Awake alert oriented x 3 normal speech and language. Mild left lower face asymmetry. Tongue midline. No drift. Mild diminished fine finger movements on left. Orbits right over left upper extremity. Mild left grip weak.. Normal sensation . Normal coordination.   ASSESSMENT/PLAN [name] is a 74 y.o. female with history of HTN, Aortic  Aneurysm, Dyslipidemia, and Hypothyroid presenting with intermittent Left sided chest pain and recurrent syncopal episodes. She did not receive IV t-PA due to delay in arrival.   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 small vessel disease   MRA  No large vessel stenosis  Carotid Doppler  No significant stenosis   2D Echo No source of embolus   TEE  aortic root ulcerated plaque  vs tumor  Cardiac MRI to eval for ulcer vs tumor- Dr. Pearlean Brownie discussed with Dr. Delton See. Has been ordered. Dr. Pearlean Brownie also discussed with Dr. Rito Ehrlich  LDL 96  HgbA1c 6.6  Lovenox 40 mg sq daily for VTE prophylaxis DIET DYS 3 Room service appropriate?: Yes; Fluid consistency:: Nectar Thick; Fluid restriction:: Other (see comments)  no antithrombotic prior to admission, now on aspirin 325 mg orally every day. Given large vessel intracranial atherosclerosis, patient should be treated with aspirin 81 mg and clopidogrel 75 mg orally every day x 3 months for secondary stroke prevention. After 3 months, change to plavix alone. Long-term dual antiplatelets are contraindicated due to  risk for intracerebral hemorrhage. Order updated.  Patient counseled to be compliant with her antithrombotimc medications  Ongoing aggressive stroke risk factor management  Therapy recommendations:  CIR. CIR Consult recommends SNF for slower paced rehab. Rehab signed off  Disposition:  pending   Hypertension  Medicine managing  Permissive hypertension (OK if < 220/120) but gradually normalize in 5-7 days  Hyperlipidemia  Home meds:  lipitor 20,  resumed in hospital  LDL 96, goal < 70  Continue statin at discharge  Diabetes, type II, new onset, HgbA1c 6.6, at goal < 7.0   Other Stroke Risk Factors  Advanced age  Cigarette smoker, advised to stop smoking  ETOH use  Obesity, Body mass index is 39.29 kg/(m^2).   Obstructive sleep apnea, not using CPAP at home. Brought to hospital for her use.  Other Active Problems  Multiple Sclerosis (Dr. Sandria Manly in past)  Multiple falls  Hypothyroid   Chronic pain syndrome  Bipolar-neuropathy  Acute bronchitis  Risk aspiration  Hospital day # 4  SETHI,PRAMOD  [facility] See Amion for Pager information 12/22/2014 3:00 PM   I have personally examined this patient, reviewed notes, independently viewed imaging studies, participated in medical decision making and plan of care. I have made any additions or clarifications directly to the above note. Agree with note above.  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 Recommend aspirin 81 mg post Plavix 75 mg daily for 3 months. Transfer to skilled nursing facility for rehabilitation. Stroke team will sign off. Delia Heady, MD Medical Director [facility] Pager: [phone] 12/22/2014 3:00 PM   To contact Stroke Continuity provider, please refer to [email or web address]. After hours, contact General Neurology ]

## 2014-12-22 NOTE — Discharge Summary (Signed)
[facility]  Physician Discharge Summary   Patient ID: [name] [name] MRN: [id number] DOB/AGE: [birth year] 74 y.o.  Admit date: 12/16/2014 Discharge date: 12/22/2014  PCP: Ezequiel Kayser, MD  DISCHARGE DIAGNOSES:  Principal Problem:   Acute CVA (cerebrovascular accident) Active Problems:   HTN (hypertension)   Tobacco dependence   Hypothyroidism   Chest pain   Syncope   Fall   Faintness   Elbow pain   Pain in the chest   Stroke   RECOMMENDATIONS FOR OUTPATIENT FOLLOW UP: 1. Patient to take aspirin and Plavix for 3 months as outlined below. And then to continue only with Plavix 2. She will need ongoing swallow evaluation 3. Monitor CBGs at least 3 times a day initially. 4. Needs repeat CT angio chest or MRA in 1 year to evaluate mild aneurysmal dilatation of her aorta.   DISCHARGE CONDITION: fair  Diet recommendation: Dysphagia 3 diet with nectar thick liquids  Filed Weights   12/17/14 0055  Weight: 107.1 kg (236 lb 1.8 oz)    INITIAL HISTORY: 74 year old Caucasian female with a past medical history of hypertension, aortic aneurysm, hypertension, hypothyroidism who presented after a syncopal episode. She also has had multiple falls. She was hospitalized for further management.  Consultations:  Neurology and palliative medicine  Procedures: 2-D echocardiogram Study Conclusions - Left ventricle: The study is technically very limited. However,LV and RV function seem OK. The cavity size was normal. Wallthickness was increased in a pattern of mild LVH. The estimatedejection fraction was 55%. Images were inadequate for LV wallmotion assessment. - Aortic valve: Poorly visualized. The valve appears to be grosslynormal. - Mitral valve: Poorly visualized. The valve appears to be grosslynormal. - Right ventricle: Poorly visualized. The cavity size was normal.Systolic function was normal. - Impressions: No cardiac source of embolism was identified, butcannot  be ruled out on the basis of this examination. Impressions: - No cardiac source of embolism was identified, but cannot be ruledout on the basis of this examination.  TEE 10 x10 mm high mobile echodense mass is seen attached to the aortic wall at the level of the sinotubular junction, directly above the ostium of the right coronary artery. It appears to be associated with an atherosclerotic plaque. Otherwise normal study (normal LV EF, small left atrium without thrombus, normal valves, no PFO or ASD). Incidental note of severe lipomatous hypertrophy of the atrial septum.   HOSPITAL COURSE:   Acute Stroke Patient was seen by neurology. She underwent stroke workup. TEE revealed a mobile, 10 x 10 mm mass in the aortic wall thought to be a thrombus. Neoplasm cannot be ruled out. Cardiac MRI was subsequently done which indeed confirmed a thrombus rather than a neoplasm. Neurology recommended aspirin and Plavix for 3 months, followed by Plavix alone. Continue statin. HbA1c 6.6. PT and OT consulted and recommended CIR. Seen by CIR and they recommended SNF. Patient was initially very reluctant to go to SNF. Social worker and myself have discussed with the patient and her daughter. The plan is now for short-term rehabilitation at skilled nursing facility.  Mild aneurysmal dilatation of the ascending aorta Will need repeat CT scan angiogram or MRA in 1 year.  Essential Hypertension Pressure is reasonably well controlled. Continue current medications.  DM 2-new onset Started on metformin 500 bid. Monitor CBGs at skilled nursing facility.  Hypothyroidism Continue Synthroid.   History of bipolar disorder  Continue home medications.   Patient remains stable. Okay for discharge to skilled nursing facility for short-term rehabilitation. Discussed with daughter  today as well.    PERTINENT LABS:  The results of significant diagnostics from this hospitalization (including imaging, microbiology,  ancillary and laboratory) are listed below for reference.     Labs: Basic Metabolic Panel:  Recent Labs Lab 12/16/14 1546 12/17/14 0244  NA 139 140  K 4.8 3.9  CL 103 105  CO2 27 26  GLUCOSE 102* 121*  BUN 24* 21*  CREATININE 1.01* 0.91  CALCIUM 9.2 9.1   Liver Function Tests:  Recent Labs Lab 12/16/14 1546  AST 123*  ALT 81*  ALKPHOS 71  BILITOT 1.8*  PROT 7.4  ALBUMIN 3.9    Recent Labs Lab 12/16/14 1546  LIPASE 14*   CBC:  Recent Labs Lab 12/16/14 1622 12/17/14 0244  WBC 13.9* 11.1*  NEUTROABS 9.8*  --   HGB 15.1* 14.8  HCT 46.3* 44.1  MCV 89.7 90.2  PLT 322 313   Cardiac Enzymes:  Recent Labs Lab 12/16/14 1546 12/17/14 0244 12/17/14 0825 12/17/14 1433  TROPONINI <0.03 <0.03 <0.03 <0.03   CBG:  Recent Labs Lab 12/16/14 2107  GLUCAP 135*     IMAGING STUDIES  Dg Forearm Left  12/17/2014   CLINICAL DATA:  Larey Seat yesterday.  Pain in the left elbow.  EXAM: LEFT FOREARM - 2 VIEW  COMPARISON:  None.  FINDINGS: Radiopaque density is identified along the anteromedial aspect of the forearm and may represent foreign body or object external to the patient. There is no evidence for acute fracture or dislocation. No soft tissue gas.  IMPRESSION: Possible foreign body in the soft tissues of the forearm.  No fracture.  If there is focal pain in the elbow consider dedicated views of the elbow.   Electronically Signed   By: Norva Pavlov M.D.   On: 12/17/2014 14:45   Ct Head Wo Contrast  12/16/2014   CLINICAL DATA:  Status post fall, generalized weakness  EXAM: CT HEAD WITHOUT CONTRAST  TECHNIQUE: Contiguous axial images were obtained from the base of the skull through the vertex without intravenous contrast.  COMPARISON:  05/17/2014  FINDINGS: No evidence of parenchymal hemorrhage or extra-axial fluid collection. No mass lesion, mass effect, or midline shift.  No CT evidence of acute infarction.  Subcortical white matter and periventricular small vessel  ischemic changes. Mild intracranial atherosclerosis.  Cerebral volume is within normal limits.  No ventriculomegaly.  The visualized paranasal sinuses are essentially clear. The mastoid air cells are unopacified.  No evidence of calvarial fracture.  IMPRESSION: No evidence of acute intracranial abnormality.  Small vessel ischemic changes.   Electronically Signed   By: Charline Bills M.D.   On: 12/16/2014 23:15   Mr Maxine Glenn Head Wo Contrast  12/18/2014   CLINICAL DATA:  Recent fall at home without head injury. No reported loss of consciousness.  EXAM: MRI HEAD WITHOUT CONTRAST  MRA HEAD WITHOUT CONTRAST  TECHNIQUE: Multiplanar, multiecho pulse sequences of the brain and surrounding structures were obtained without intravenous contrast. Angiographic images of the head were obtained using MRA technique without contrast.  COMPARISON:  Head CT 12/16/2014 and MRI 09/18/2010  FINDINGS: MRI HEAD FINDINGS  The examination had to be discontinued prior to completion due to patient's inability to tolerate being in the scanner any longer and inability to remain motionless. Patient declined further imaging and medication for anxiolysis. Axial and coronal diffusion, sagittal T1, and axial T2 weighted images were obtained.  Anterior right temporal lobe is obscured on diffusion-weighted imaging due to susceptibility artifact. There are multiple patchy areas  of acute infarction in the right MCA territory involving cortex and white matter of the posterior frontal lobe, parietal lobe, posterior temporal lobe, and insula/external capsule. There is associated mild cytotoxic edema without mass effect. No parenchymal hematoma is seen.  There is no evidence of mass, midline shift, or extra-axial fluid collection. Ventricles and sulci are within normal limits for age. Patchy and confluent T2 hyperintensities in the subcortical and deep cerebral white matter and in the pons are nonspecific but compatible with rather extensive chronic small  vessel ischemic disease. Chronic ischemic changes are also noted in the thalami. Chronic lacunar infarcts are present in the centrum semiovale bilaterally and in the anterior limb of the left internal capsule.  Prior bilateral cataract extraction is noted. Small left sphenoid sinus mucous retention cyst is noted. Mastoid air cells are clear. Major intracranial vascular flow voids are preserved.  MRA HEAD FINDINGS  Images are mildly to moderately motion degraded.  The visualized distal vertebral arteries are patent and codominant. PICA and SCA origins are patent. Basilar artery is patent and mildly tortuous without evidence of significant stenosis. Posterior communicating arteries are not clearly identified. P1 and proximal P2 segments are patent bilaterally without evidence of significant stenosis. More distal PCAs are not well evaluated.  Intracranial internal carotid arteries are patent without evidence of significant stenosis. ACAs are patent without evidence of proximal stenosis. M1 segments are patent without stenosis. M2 trunks are patent bilaterally. Evaluation of MCA branch vessels is limited due to degraded image quality, with suggestion of a slightly diminished number of more distal MCA branches on the left compared to the right. No intracranial aneurysm is identified.  IMPRESSION: 1. Motion degraded, incomplete examination as above. 2. Patchy right MCA territory acute infarcts. No evidence of hemorrhage. 3. Advanced chronic small vessel ischemic disease. 4. No evidence of major intracranial arterial occlusion or significant proximal stenosis. Suboptimal branch vessel evaluation.   Electronically Signed   By: Sebastian Ache   On: 12/18/2014 10:00   Mr Brain Wo Contrast  12/18/2014   CLINICAL DATA:  Recent fall at home without head injury. No reported loss of consciousness.  EXAM: MRI HEAD WITHOUT CONTRAST  MRA HEAD WITHOUT CONTRAST  TECHNIQUE: Multiplanar, multiecho pulse sequences of the brain and  surrounding structures were obtained without intravenous contrast. Angiographic images of the head were obtained using MRA technique without contrast.  COMPARISON:  Head CT 12/16/2014 and MRI 09/18/2010  FINDINGS: MRI HEAD FINDINGS  The examination had to be discontinued prior to completion due to patient's inability to tolerate being in the scanner any longer and inability to remain motionless. Patient declined further imaging and medication for anxiolysis. Axial and coronal diffusion, sagittal T1, and axial T2 weighted images were obtained.  Anterior right temporal lobe is obscured on diffusion-weighted imaging due to susceptibility artifact. There are multiple patchy areas of acute infarction in the right MCA territory involving cortex and white matter of the posterior frontal lobe, parietal lobe, posterior temporal lobe, and insula/external capsule. There is associated mild cytotoxic edema without mass effect. No parenchymal hematoma is seen.  There is no evidence of mass, midline shift, or extra-axial fluid collection. Ventricles and sulci are within normal limits for age. Patchy and confluent T2 hyperintensities in the subcortical and deep cerebral white matter and in the pons are nonspecific but compatible with rather extensive chronic small vessel ischemic disease. Chronic ischemic changes are also noted in the thalami. Chronic lacunar infarcts are present in the centrum semiovale bilaterally and in  the anterior limb of the left internal capsule.  Prior bilateral cataract extraction is noted. Small left sphenoid sinus mucous retention cyst is noted. Mastoid air cells are clear. Major intracranial vascular flow voids are preserved.  MRA HEAD FINDINGS  Images are mildly to moderately motion degraded.  The visualized distal vertebral arteries are patent and codominant. PICA and SCA origins are patent. Basilar artery is patent and mildly tortuous without evidence of significant stenosis. Posterior communicating  arteries are not clearly identified. P1 and proximal P2 segments are patent bilaterally without evidence of significant stenosis. More distal PCAs are not well evaluated.  Intracranial internal carotid arteries are patent without evidence of significant stenosis. ACAs are patent without evidence of proximal stenosis. M1 segments are patent without stenosis. M2 trunks are patent bilaterally. Evaluation of MCA branch vessels is limited due to degraded image quality, with suggestion of a slightly diminished number of more distal MCA branches on the left compared to the right. No intracranial aneurysm is identified.  IMPRESSION: 1. Motion degraded, incomplete examination as above. 2. Patchy right MCA territory acute infarcts. No evidence of hemorrhage. 3. Advanced chronic small vessel ischemic disease. 4. No evidence of major intracranial arterial occlusion or significant proximal stenosis. Suboptimal branch vessel evaluation.   Electronically Signed   By: Sebastian Ache   On: 12/18/2014 10:00   Ct Elbow Left W/o Cm  12/17/2014   CLINICAL DATA:  Fall, left elbow pain  EXAM: CT OF THE LEFT ELBOW WITHOUT CONTRAST  TECHNIQUE: Multidetector CT imaging was performed according to the standard protocol. Multiplanar CT image reconstructions were also generated.  COMPARISON:  Forearm radiograph performed earlier today  FINDINGS: No fracture or dislocation.  No joint effusion.  IMPRESSION: Negative. No that the area of possible foreign body seen on radiograph is not included on this examination.   Electronically Signed   By: Esperanza Heir M.D.   On: 12/17/2014 21:29   Dg Chest Port 1 View  12/17/2014   CLINICAL DATA:  Fall, weakness  EXAM: PORTABLE CHEST - 1 VIEW  COMPARISON:  CT chest dated 11/27/2014  FINDINGS: Lungs are essentially clear.  No pleural effusion or pneumothorax.  Cardiomegaly.  IMPRESSION: No evidence of acute cardiopulmonary disease.   Electronically Signed   By: Charline Bills M.D.   On: 12/17/2014  00:12   Mr Card Morphology Wo/w Cm  12/21/2014   CLINICAL DATA:  74 year old female post stroke with an incidental finding of a mass in the ascending aorta.  EXAM: CARDIAC MRI  TECHNIQUE: The patient was scanned on a 1.5 Tesla GE magnet. A dedicated cardiac coil was used. Functional imaging was done using Fiesta sequences. 2,3, and 4 chamber views were done to assess for RWMA's. Modified Simpson's rule using a short axis stack was used to calculate an ejection fraction on a dedicated work [employer]. The patient received 24 cc of Multihance. After 10 minutes inversion recovery sequences were used to assess for infiltration and scar tissue.  CONTRAST:  24 cc  of Multihance  FINDINGS: 1. There is a small mass attached to the anterior wall of the ascending aorta measuring 9 x 8 mm. The mass is located just 4 mm above RCA ostium and 24 mm from the aortic annulus.  There is no late gadolinium enhancement in the mass with regular and long TI times. There are no signs of invasion into the wall of the ascending aorta. No other masses are seen.  2. Ascending aorta is aneurysmal with  maximum measured diameter 43 x 41 mm.  Aortic root has normal size measuring 34 x 33 x 33 mm.  Aortic arch, descending thoracic aorta and pulmonary artery have normal size.  IMPRESSION: 1. A small mass attached to the anterior wall of the ascending aorta measuring 9 x 8 mm is located just above RCA ostium with no signs of invasion into the vessel wall. No other masses were seen.  No late gadolinium enhancement in present in the mass.  Collectively, these findings are consistent with a small thrombus.  2. There is mild aneurysmal dilatation of the ascending aorta measuring 43 x 41 mm. A repeat scan (CTA or MRA) is recommended in 1 year.  Tobias Alexander   Electronically Signed   By: Tobias Alexander   On: 12/21/2014 19:01    DISCHARGE EXAMINATION: Filed Vitals:   12/22/14 0106 12/22/14 0620 12/22/14 1012 12/22/14 1100    BP: 126/69 154/87 133/87 132/87  Pulse: 66 73 90 74  Temp: 98.6 F (37 C) 98.3 F (36.8 C) 98.2 F (36.8 C)   TempSrc: Oral Oral Oral   Resp: 18 19 17 18   Height:      Weight:      SpO2: 93% 98% 96%    General appearance: alert, cooperative, appears stated age and no distress Resp: clear to auscultation bilaterally Cardio: regular rate and rhythm, S1, S2 normal, no murmur, click, rub or gallop GI: soft, non-tender; bowel sounds normal; no masses,  no organomegaly Extremities: extremities normal, atraumatic, no cyanosis or edema  DISPOSITION: SNF  Discharge Instructions    Call MD for:  difficulty breathing, headache or visual disturbances    Complete by:  As directed      Call MD for:  extreme fatigue    Complete by:  As directed      Call MD for:  persistant dizziness or light-headedness    Complete by:  As directed      Call MD for:  persistant nausea and vomiting    Complete by:  As directed      Call MD for:  temperature >100.4    Complete by:  As directed      Discharge instructions    Complete by:  As directed   You were cared for by a hospitalist during your hospital stay. If you have any questions about your discharge medications or the care you received while you were in the hospital after you are discharged, you can call the unit and asked to speak with the hospitalist on call if the hospitalist that took care of you is not available. Once you are discharged, your primary care physician will handle any further medical issues. Please note that NO REFILLS for any discharge medications will be authorized once you are discharged, as it is imperative that you return to your primary care physician (or establish a relationship with a primary care physician if you do not have one) for your aftercare needs so that they can reassess your need for medications and monitor your lab values. If you do not have a primary care physician, you can call [phone] for a physician referral.      Increase activity slowly    Complete by:  As directed            ALLERGIES:  Allergies  Allergen Reactions  . Codeine Other (See Comments)    Real bad chest pains  . Lisinopril Cough  . Demerol Nausea Only  . Penicillins Rash and Other (  See Comments)    Pt not sure "chestpains"     Current Discharge Medication List    START taking these medications   Details  acetaminophen (TYLENOL) 325 MG tablet Take 2 tablets (650 mg total) by mouth every 6 (six) hours as needed for mild pain (or Fever >/= 101).    aspirin EC 81 MG EC tablet Take 1 tablet (81 mg total) by mouth daily. For 3 months. And then Plavix alone.    celecoxib (CELEBREX) 200 MG capsule Take 1 capsule (200 mg total) by mouth daily.    clopidogrel (PLAVIX) 75 MG tablet Take 1 tablet (75 mg total) by mouth daily.    metFORMIN (GLUCOPHAGE) 500 MG tablet Take 1 tablet (500 mg total) by mouth 2 (two) times daily with a meal.    nicotine (NICODERM CQ - DOSED IN MG/24 HR) 7 mg/24hr patch Place 1 patch (7 mg total) onto the skin daily. Qty: 28 patch, Refills: 0    oxyCODONE (OXY IR/ROXICODONE) 5 MG immediate release tablet Take 0.5 tablets (2.5 mg total) by mouth every 4 (four) hours as needed for moderate pain. Qty: 30 tablet, Refills: 0      CONTINUE these medications which have CHANGED   Details  clonazePAM (KLONOPIN) 1 MG tablet Take 1 tablet (1 mg total) by mouth at bedtime. Qty: 30 tablet, Refills: 0    gabapentin (NEURONTIN) 100 MG capsule Take 1 capsule (100 mg total) by mouth every 8 (eight) hours.      CONTINUE these medications which have NOT CHANGED   Details  amLODipine (NORVASC) 5 MG tablet Take 5 mg by mouth daily.    atorvastatin (LIPITOR) 20 MG tablet Take 20 mg by mouth daily.    baclofen (LIORESAL) 10 MG tablet Take 10 mg by mouth 2 (two) times daily as needed for muscle spasms.    buPROPion (WELLBUTRIN XL) 300 MG 24 hr tablet Take 1 tablet by mouth daily.     Cholecalciferol (VITAMIN D)  2000 UNITS tablet Take 4,000 Units by mouth 2 (two) times daily.     Cyanocobalamin (VITAMIN B 12 PO) Take 20,000-25,000 mcg by mouth every morning.    diclofenac sodium (VOLTAREN) 1 % GEL Apply 2 g topically every 8 (eight) hours as needed (pain).    diphenoxylate-atropine (LOMOTIL) 2.5-0.025 MG per tablet Take 2 tablets by mouth 4 (four) times daily as needed for diarrhea or loose stools.     FLUoxetine (PROZAC) 40 MG capsule Take 40 mg by mouth every morning.     levothyroxine (SYNTHROID, LEVOTHROID) 200 MCG tablet Take 200 mcg by mouth daily before breakfast.    losartan (COZAAR) 100 MG tablet Take 1 tablet by mouth every morning.     pantoprazole (PROTONIX) 40 MG tablet Take 40 mg by mouth 2 (two) times daily.    Umeclidinium-Vilanterol (ANORO ELLIPTA) 62.5-25 MCG/INH AEPB Inhale into the lungs daily.      STOP taking these medications     omeprazole (PRILOSEC) 20 MG capsule      pravastatin (PRAVACHOL) 40 MG tablet      levofloxacin (LEVAQUIN) 500 MG tablet        Follow-up Information    Follow up with PERINI,MARK A, MD. Schedule an appointment as soon as possible for a visit in 1 week.   Specialty:  Internal Medicine   Why:  post hospitalization follow up   Contact information:   [address] [place] [address] [phone]       Follow up with  SETHI,PRAMOD, MD. Schedule an appointment as soon as possible for a visit in 2 months.   Specialties:  Neurology, Radiology   Why:  post hospitalization follow up   Contact information:   [address] [place] [address] [phone]       TOTAL DISCHARGE TIME: 35 minutes  [facility]  [facility] Pager [phone]  12/22/2014, 12:30 PM

## 2014-12-22 NOTE — Clinical Social Work Note (Signed)
Clinical Social Work Assessment  Patient Details  Name: [name] [name] MRN: [id number] Date of Birth: 1941/06/19  Date of referral:  12/22/14               Reason for consult:  Facility Placement, Discharge Planning                Permission sought to share information with:  Case Manager, Facility Medical sales representative, PCP, Family Supports Permission granted to share information::  Yes, Verbal Permission Granted  Name::      Hollie Salk )  Agency::   (SNF's )  Relationship::   (Daughter )  Contact Information:   [phone])  Housing/Transportation Living arrangements for the past 2 months:  Single Family Home Source of Information:  Adult Children, Patient Patient Interpreter Needed:  None Criminal Activity/Legal Involvement Pertinent to Current Situation/Hospitalization:  No - Comment as needed Significant Relationships:  Adult Children Lives with:  Adult Children Do you feel safe going back to the place where you live?  No Need for family participation in patient care:  Yes (Comment)  Care giving concerns:  Patient's daughter reported physically she is unable to care for the patient alone at home.    Social Worker assessment / plan:  Visual merchandiser spoke at [employer] with patient's dtr, Robyn in reference to post-acute placement for SNF. CSW introduced CSW role and SNF process to both patient and dtr. Pt's dtr reported pt has been at [facility] and [facility]. Pt's dtr further reported pt will absolutely not go back to [place] due to poor experience and signed herself out of [facility] as well. Pt's dtr plans to leave choosing a facility to the pt. CSW has completed FL-2 and faxed to SNF's in [facility]. Pt's dtr also considering long-term placement for the pt as well. CSW will continue to follow pt and pt's family for continued support and to facilitate pt's discharge needs once medically stable.   Employment status:   Retired Database administrator PT Recommendations:  Skilled Nursing Facility Information / Referral to community resources:  Skilled Nursing Facility  Patient/Family's Response to care:  Pt declined SNF placement however is now considering SNF. Pt's dtr pleasant and appreciated social work intervention.   Patient/Family's Understanding of and Emotional Response to Diagnosis, Current Treatment, and Prognosis: Pt's dtr understanding of pt's medical diagnosis of stroke and realizes she is unable to physically care for the patient at home.     Emotional Assessment Appearance:  Appears stated age Attitude/Demeanor/Rapport:   Pleasant  Affect (typically observed):   Apprehensive  Orientation:  Oriented to Self, Oriented to Place, Oriented to  Time, Oriented to Situation Alcohol / Substance use:  Never Used Psych involvement (Current and /or in the community):  No (Comment)  Discharge Needs  Concerns to be addressed:   Basic Needs  Readmission within the last 30 days:   No Current discharge risk:   Dependence with mobility  Barriers to Discharge:   Barriers resolved    Derenda Fennel, MSW, LCSWA [phone] 12/22/2014 10:44 AM

## 2014-12-22 NOTE — Discharge Instructions (Signed)
Ischemic Stroke °A stroke (cerebrovascular accident) is the sudden death of brain tissue. It is a medical emergency. A stroke can cause permanent loss of brain function. This can cause problems with different parts of your body. A transient ischemic attack (TIA) is different because it does not cause permanent damage. A TIA is a short-lived problem of poor blood flow affecting a part of the brain. A TIA is also a serious problem because having a TIA greatly increases the chances of having a stroke. When symptoms first develop, you cannot know if the problem might be a stroke or a TIA. °CAUSES  °A stroke is caused by a decrease of oxygen supply to an area of your brain. It is usually the result of a small blood clot or collection of cholesterol or fat (plaque) that blocks blood flow in the brain. A stroke can also be caused by blocked or damaged carotid arteries.  °RISK FACTORS °· High blood pressure (hypertension). °· High cholesterol. °· Diabetes mellitus. °· Heart disease. °· The buildup of plaque in the blood vessels (peripheral artery disease or atherosclerosis). °· The buildup of plaque in the blood vessels providing blood and oxygen to the brain (carotid artery stenosis). °· An abnormal heart rhythm (atrial fibrillation). °· Obesity. °· Smoking. °· Taking oral contraceptives (especially in combination with smoking). °· Physical inactivity. °· A diet high in fats, salt (sodium), and calories. °· Alcohol use. °· Use of illegal drugs (especially cocaine and methamphetamine). °· Being African American. °· Being over the age of 55. °· Family history of stroke. °· Previous history of blood clots, stroke, TIA, or heart attack. °· Sickle cell disease. °SYMPTOMS  °These symptoms usually develop suddenly, or may be newly present upon awakening from sleep: °· Sudden weakness or numbness of the face, arm, or leg, especially on one side of the body. °· Sudden trouble walking or difficulty moving arms or legs. °· Sudden  confusion. °· Sudden personality changes. °· Trouble speaking (aphasia) or understanding. °· Difficulty swallowing. °· Sudden trouble seeing in one or both eyes. °· Double vision. °· Dizziness. °· Loss of balance or coordination. °· Sudden severe headache with no known cause. °· Trouble reading or writing. °DIAGNOSIS  °Your health care provider can often determine the presence or absence of a stroke based on your symptoms, history, and physical exam. Computed tomography (CT) of the brain is usually performed to confirm the stroke, determine causes, and determine stroke severity. Other tests may be done to find the cause of the stroke. These tests may include: °· Electrocardiography. °· Continuous heart monitoring. °· Echocardiography. °· Carotid ultrasonography. °· Magnetic resonance imaging (MRI). °· A scan of the brain circulation. °· Blood tests. °PREVENTION  °The risk of a stroke can be decreased by appropriately treating high blood pressure, high cholesterol, diabetes, heart disease, and obesity and by quitting smoking, limiting alcohol, and staying physically active. °TREATMENT  °Time is of the essence. It is important to seek treatment at the first sign of these symptoms because you may receive a medicine to dissolve the clot (thrombolytic) that cannot be given if too much time has passed since your symptoms began. Even if you do not know when your symptoms began, get treatment as soon as possible as there are other treatment options available including oxygen, intravenous (IV) fluids, and medicines to thin the blood (anticoagulants). Treatment of stroke depends on the duration, severity, and cause of your symptoms. Medicines and dietary changes may be used to address diabetes, high blood   pressure, and other risk factors. Physical, speech, and occupational therapists will assess you and work with you to improve any functions impaired by the stroke. Measures will be taken to prevent short-term and long-term  complications, including infection from breathing foreign material into the lungs (aspiration pneumonia), blood clots in the legs, bedsores, and falls. Rarely, surgery may be needed to remove large blood clots or to open up blocked arteries. °HOME CARE INSTRUCTIONS  °· Take medicines only as directed by your health care provider. Follow the directions carefully. Medicines may be used to control risk factors for a stroke. Be sure you understand all your medicine instructions. °· You may be told to take a medicine to thin the blood, such as aspirin or the anticoagulant warfarin. Warfarin needs to be taken exactly as instructed. °¨ Too much and too little warfarin are both dangerous. Too much warfarin increases the risk of bleeding. Too little warfarin continues to allow the risk for blood clots. While taking warfarin, you will need to have regular blood tests to measure your blood clotting time. These blood tests usually include both the PT and INR tests. The PT and INR results allow your health care provider to adjust your dose of warfarin. The dose can change for many reasons. It is critically important that you take warfarin exactly as prescribed, and that you have your PT and INR levels drawn exactly as directed. °¨ Many foods, especially foods high in vitamin K, can interfere with warfarin and affect the PT and INR results. Foods high in vitamin K include spinach, kale, broccoli, cabbage, collard and turnip greens, brussels sprouts, peas, cauliflower, seaweed, and parsley, as well as beef and pork liver, green tea, and soybean oil. You should eat a consistent amount of foods high in vitamin K. Avoid major changes in your diet, or notify your health care provider before changing your diet. Arrange a visit with a dietitian to answer your questions. °¨ Many medicines can interfere with warfarin and affect the PT and INR results. You must tell your health care provider about any and all medicines you take. This  includes all vitamins and supplements. Be especially cautious with aspirin and anti-inflammatory medicines. Do not take or discontinue any prescribed or over-the-counter medicine except on the advice of your health care provider or pharmacist. °¨ Warfarin can have side effects, such as excessive bruising or bleeding. You will need to hold pressure over cuts for longer than usual. Your health care provider or pharmacist will discuss other potential side effects. °¨ Avoid sports or activities that may cause injury or bleeding. °¨ Be mindful when shaving, flossing your teeth, or handling sharp objects. °¨ Alcohol can change the body's ability to handle warfarin. It is best to avoid alcoholic drinks or consume only very small amounts while taking warfarin. Notify your health care provider if you change your alcohol intake. °¨ Notify your dentist or other health care providers before procedures. °· If swallow studies have determined that your swallowing reflex is present, you should eat healthy foods. Including 5 or more servings of fruits and vegetables a day may reduce the risk of stroke. Foods may need to be a certain consistency (soft or pureed), or small bites may need to be taken in order to avoid aspirating or choking. Certain dietary changes may be advised to address high blood pressure, high cholesterol, diabetes, or obesity. °¨ Food choices that are low in sodium, saturated fat, trans fat, and cholesterol are recommended to manage high blood pressure. °¨   Food choies that are high in fiber, and low in saturated fat, trans fat, and cholesterol may control cholesterol levels. °¨ Controlling carbohydrates and sugar intake is recommended to manage diabetes. °¨ Reducing calorie intake and making food choices that are low in sodium, saturated fat, trans fat, and cholesterol are recommended to manage obesity. °· Maintain a healthy weight. °· Stay physically active. It is recommended that you get at least 30 minutes of  activity on all or most days. °· Do not use any tobacco products including cigarettes, chewing tobacco, or electronic cigarettes. °· Limit alcohol use even if you are not taking warfarin. Moderate alcohol use is considered to be: °¨ No more than 2 drinks each day for men. °¨ No more than 1 drink each day for nonpregnant women. °· Home safety. A safe home environment is important to reduce the risk of falls. Your health care provider may arrange for specialists to evaluate your home. Having grab bars in the bedroom and bathroom is often important. Your health care provider may arrange for equipment to be used at home, such as raised toilets and a seat for the shower. °· Physical, occupational, and speech therapy. Ongoing therapy may be needed to maximize your recovery after a stroke. If you have been advised to use a walker or a cane, use it at all times. Be sure to keep your therapy appointments. °· Follow all instructions for follow-up with your health care provider. This is very important. This includes any referrals, physical therapy, rehabilitation, and lab tests. Proper follow-up can prevent another stroke from occurring. °SEEK MEDICAL CARE IF: °· You have personality changes. °· You have difficulty swallowing. °· You are seeing double. °· You have dizziness. °· You have a fever. °· You have skin breakdown. °SEEK IMMEDIATE MEDICAL CARE IF:  °Any of these symptoms may represent a serious problem that is an emergency. Do not wait to see if the symptoms will go away. Get medical help right away. Call your local emergency services (911 in [place] Do not drive yourself to the hospital. °· You have sudden weakness or numbness of the face, arm, or leg, especially on one side of the body. °· You have sudden trouble walking or difficulty moving arms or legs. °· You have sudden confusion. °· You have trouble speaking (aphasia) or understanding. °· You have sudden trouble seeing in one or both eyes. °· You have a loss of  balance or coordination. °· You have a sudden, severe headache with no known cause. °· You have new chest pain or an irregular heartbeat. °· You have a partial or total loss of consciousness. °Document Released: 06/10/2005 Document Revised: 10/25/2013 Document Reviewed: 01/19/2012 °ExitCare® Patient Information ©2015 [employer]. This information is not intended to replace advice given to you by your health care provider. Make sure you discuss any questions you have with your health care provider. ° °

## 2014-12-22 NOTE — Clinical Social Work Placement (Signed)
   CLINICAL SOCIAL WORK PLACEMENT  NOTE  Date:  12/22/2014  Patient Details  Name: [name] [name] MRN: [id number] Date of Birth: [birth year]  Clinical Social Work is seeking post-discharge placement for this patient at the Skilled  Nursing Facility level of care (*CSW will initial, date and re-position this form in  chart as items are completed):  Yes   Patient/family provided with [facility] Clinical Social Work Department's list of facilities offering this level of care within the geographic area requested by the patient (or if unable, by the patient's family).  Yes   Patient/family informed of their freedom to choose among providers that offer the needed level of care, that participate in Medicare, Medicaid or managed care program needed by the patient, have an available bed and are willing to accept the patient.  Yes   Patient/family informed of [facility]'s ownership interest in [facility] and [facility], as well as of the fact that they are under no obligation to receive care at these facilities.  PASRR submitted to EDS on       PASRR number received on       Existing PASRR number confirmed on 12/22/14     FL2 transmitted to all facilities in geographic area requested by pt/family on 12/22/14     FL2 transmitted to all facilities within larger geographic area on       Patient informed that his/her managed care company has contracts with or will negotiate with certain facilities, including the following:   (yes)         Patient/family informed of bed offers received.  Patient chooses bed at       Physician recommends and patient chooses bed at      Patient to be transferred to   on  .  Patient to be transferred to facility by       Patient family notified on   of transfer.  Name of family member notified:        PHYSICIAN       Additional Comment:    _______________________________________________ Derenda Fennel, MSW, LCSWA [phone] 12/22/2014  11:02 AM

## 2014-12-22 NOTE — Clinical Social Work Note (Signed)
CSW has presented bed offers to patient. Patient reported currently she is undecided and would like for her daughter to assist her.   CSW remains available.   Derenda Fennel, MSW, LCSWA [phone] 12/22/2014 12:38 PM

## 2014-12-22 NOTE — Progress Notes (Signed)
Discharge order received. Pt alert and oriented. VSS. Pt is discharge to SNF. Report given to [facility] nurse. Transported by EMS.   Shella Spearing, RN

## 2014-12-23 ENCOUNTER — Other Ambulatory Visit: Payer: Self-pay

## 2014-12-23 MED ORDER — CLONAZEPAM 1 MG PO TABS: 1.0000 mg | ORAL_TABLET | Freq: Every day | ORAL | Status: AC

## 2014-12-23 MED ORDER — OXYCODONE HCL 5 MG PO TABS: 2.5000 mg | ORAL_TABLET | ORAL | Status: AC | PRN

## 2014-12-23 MED ORDER — DIPHENOXYLATE-ATROPINE 2.5-0.025 MG PO TABS: 2.0000 | ORAL_TABLET | Freq: Four times a day (QID) | ORAL | Status: AC | PRN

## 2014-12-23 NOTE — ED Notes (Signed)
Pt is up for discharge. I spoke with pt about returning to [facility] and patient agreed to go back to [facility]. [facility] to be called for transport.

## 2014-12-23 NOTE — Telephone Encounter (Signed)
[facility] 

## 2014-12-23 NOTE — BH Assessment (Signed)
Assessment completed. Consulted Hulan Fess, NP who agrees that pt does not meet inpatient criteria at this time and can be discharged. Dr. Ranae Palms has been informed of the recommendation.

## 2014-12-23 NOTE — ED Notes (Signed)
Pt verbalized understanding of d/c instructions and has no further questons. Pt is stable and in NAD, [facility] called.
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
## 2014-12-23 NOTE — ED Notes (Signed)
[facility] Called 

## 2014-12-23 NOTE — ED Notes (Signed)
Pt moved to pod c 24, placed in blue scrubs.

## 2014-12-29 ENCOUNTER — Non-Acute Institutional Stay (SKILLED_NURSING_FACILITY): Payer: Medicare Other | Admitting: Internal Medicine

## 2014-12-29 DIAGNOSIS — I712 Thoracic aortic aneurysm, without rupture: Secondary | ICD-10-CM | POA: Diagnosis not present

## 2014-12-29 DIAGNOSIS — E039 Hypothyroidism, unspecified: Secondary | ICD-10-CM | POA: Diagnosis not present

## 2014-12-29 DIAGNOSIS — K219 Gastro-esophageal reflux disease without esophagitis: Secondary | ICD-10-CM

## 2014-12-29 DIAGNOSIS — I7121 Aneurysm of the ascending aorta, without rupture: Secondary | ICD-10-CM

## 2014-12-29 DIAGNOSIS — D649 Anemia, unspecified: Secondary | ICD-10-CM

## 2014-12-29 DIAGNOSIS — F329 Major depressive disorder, single episode, unspecified: Secondary | ICD-10-CM

## 2014-12-29 DIAGNOSIS — I639 Cerebral infarction, unspecified: Secondary | ICD-10-CM | POA: Diagnosis not present

## 2014-12-29 DIAGNOSIS — R5381 Other malaise: Secondary | ICD-10-CM

## 2014-12-29 DIAGNOSIS — H9192 Unspecified hearing loss, left ear: Secondary | ICD-10-CM | POA: Diagnosis not present

## 2014-12-29 DIAGNOSIS — N289 Disorder of kidney and ureter, unspecified: Secondary | ICD-10-CM

## 2014-12-29 DIAGNOSIS — M159 Polyosteoarthritis, unspecified: Secondary | ICD-10-CM

## 2014-12-29 DIAGNOSIS — F17200 Nicotine dependence, unspecified, uncomplicated: Secondary | ICD-10-CM | POA: Diagnosis not present

## 2014-12-29 DIAGNOSIS — I10 Essential (primary) hypertension: Secondary | ICD-10-CM | POA: Diagnosis not present

## 2014-12-29 DIAGNOSIS — I69391 Dysphagia following cerebral infarction: Secondary | ICD-10-CM

## 2014-12-29 DIAGNOSIS — G2581 Restless legs syndrome: Secondary | ICD-10-CM

## 2014-12-29 DIAGNOSIS — D72829 Elevated white blood cell count, unspecified: Secondary | ICD-10-CM

## 2014-12-29 DIAGNOSIS — K5900 Constipation, unspecified: Secondary | ICD-10-CM

## 2014-12-29 DIAGNOSIS — R300 Dysuria: Secondary | ICD-10-CM

## 2014-12-29 DIAGNOSIS — F32A Depression, unspecified: Secondary | ICD-10-CM

## 2014-12-29 DIAGNOSIS — E119 Type 2 diabetes mellitus without complications: Secondary | ICD-10-CM

## 2014-12-29 DIAGNOSIS — H538 Other visual disturbances: Secondary | ICD-10-CM

## 2014-12-30 NOTE — Progress Notes (Signed)
Patient ID: [name] [name], female   DOB: 04/26/1941, 74 y.o.   MRN: [id number]     Facility: [facility]    PCP: Ezequiel Kayser, MD  Code Status: DNR  Allergies  Allergen Reactions  . Codeine Other (See Comments)    Real bad chest pains  . Lisinopril Cough  . Demerol Nausea Only  . Penicillins Rash and Other (See Comments)    Pt not sure "chestpains"    Chief Complaint  Patient presents with  . New Admit To SNF     HPI:  74 y.o. year old patient is here for short term rehabilitation post hospital admission from 12/16/14-12/22/14 with syncope and fall. 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 Neurology was consulted. She underwent stroke workup. TEE showed a mobile, 10 x 10 mm mass in the aortic wall thought to be a thrombus. Cardiac MRI confirmed a thrombus rather than a neoplasm. Aspirin and Plavix were recommended for 3 months, followed by Plavix alone. Statin was continued. She was diagnosed with DM in the hospital and started on metformin. She has PMH of HTN, hypothyroidism, bipolar disorder and aortic aneurysm. She is seen in her room today. She complaints of burning with urination x 2 days. She also would like a stool softner. She feels her strength to be returning slowly. Her left eye vision and left ear hearing has been affected post stroke. No other concerns  Review of Systems:  Constitutional: Negative for fever, chills, diaphoresis.  HENT: Negative for headache, congestion, nasal discharge Eyes: Negative for double vision and discharge.  Respiratory: Negative for cough, shortness of breath and wheezing.   Cardiovascular: Negative for chest pain, palpitations, leg swelling.  Gastrointestinal: Negative for heartburn, nausea, vomiting, abdominal pain Genitourinary: Negative for hematuria, flank pain.  Musculoskeletal: Negative for back pain, falls in facility Skin: Negative for itching, rash.  Neurological: Negative  for dizziness, tingling, focal weakness Psychiatric/Behavioral: Negative for depression   Past Medical History  Diagnosis Date  . HTN (hypertension)   . Ascending aortic aneurysm   . Dyslipidemia   . Tobacco dependence   . Hypothyroidism   . Depression   . Neurogenic bladder   . Obesity   . Dog bite(E906.0)     RLL with systemic inflammatory response  . Chronic low back pain   . Anxiety   . MS (multiple sclerosis)     Followed by Dr. Sandria Manly  . Arthritis   . History of ulcer disease 30 yrs ago  . IBS (irritable bowel syndrome)   . Incontinence in female   . Obstructive sleep apnea     a. compliant with CPAP   Past Surgical History  Procedure Laterality Date  . Cholecystectomy    . Hysterectomy    . Nasal sinus surgery    . Left eye surgery    . Bilateral foot surgery    . Leg surgery  2006    due to fx leg   . Back surgery    . Total knee arthroplasty Left 04/11/2014    Procedure: LEFT TOTAL KNEE ARTHROPLASTY;  Surgeon: Shelda Pal, MD;  Location: [facility] ORS;  Service: Orthopedics;  Laterality: Left;  . Tee without cardioversion N/A 12/21/2014    Procedure: TRANSESOPHAGEAL ECHOCARDIOGRAM (TEE);  Surgeon: Thurmon Fair, MD;  Location: [facility] ENDOSCOPY;  Service: Cardiovascular;  Laterality: N/A;   Social History:   reports that she has been smoking Cigarettes.  She has a 15 pack-year smoking history.  She has never used smokeless tobacco. She reports that she drinks alcohol. She reports that she does not use illicit drugs.  Family History  Problem Relation Age of Onset  . Cancer Father   . Emphysema Father     Medications: Patient's Medications  New Prescriptions   No medications on file  Previous Medications   ACETAMINOPHEN (TYLENOL) 325 MG TABLET    Take 2 tablets (650 mg total) by mouth every 6 (six) hours as needed for mild pain (or Fever >/= 101).   AMLODIPINE (NORVASC) 5 MG TABLET    Take 5 mg by mouth daily.   ASPIRIN EC 81 MG EC TABLET    Take 1 tablet (81 mg  total) by mouth daily. For 3 months. And then Plavix alone.   ATORVASTATIN (LIPITOR) 20 MG TABLET    Take 20 mg by mouth daily.   BACLOFEN (LIORESAL) 10 MG TABLET    Take 10 mg by mouth 2 (two) times daily as needed for muscle spasms.   BUPROPION (WELLBUTRIN XL) 300 MG 24 HR TABLET    Take 1 tablet by mouth daily.    CELECOXIB (CELEBREX) 200 MG CAPSULE    Take 1 capsule (200 mg total) by mouth daily.   CHOLECALCIFEROL (VITAMIN D) 2000 UNITS TABLET    Take 4,000 Units by mouth 2 (two) times daily.    CLONAZEPAM (KLONOPIN) 1 MG TABLET    Take 1 tablet (1 mg total) by mouth at bedtime.   CLOPIDOGREL (PLAVIX) 75 MG TABLET    Take 1 tablet (75 mg total) by mouth daily.   CYANOCOBALAMIN (VITAMIN B 12 PO)    Take 20,000-25,000 mcg by mouth every morning.   DICLOFENAC SODIUM (VOLTAREN) 1 % GEL    Apply 2 g topically every 8 (eight) hours as needed (pain).   DIPHENOXYLATE-ATROPINE (LOMOTIL) 2.5-0.025 MG PER TABLET    Take 2 tablets by mouth 4 (four) times daily as needed for diarrhea or loose stools.   FLUOXETINE (PROZAC) 40 MG CAPSULE    Take 40 mg by mouth every morning.    GABAPENTIN (NEURONTIN) 100 MG CAPSULE    Take 1 capsule (100 mg total) by mouth every 8 (eight) hours.   LEVOTHYROXINE (SYNTHROID, LEVOTHROID) 200 MCG TABLET    Take 200 mcg by mouth daily before breakfast.   LOSARTAN (COZAAR) 100 MG TABLET    Take 1 tablet by mouth every morning.    METFORMIN (GLUCOPHAGE) 500 MG TABLET    Take 1 tablet (500 mg total) by mouth 2 (two) times daily with a meal.   NICOTINE (NICODERM CQ - DOSED IN MG/24 HR) 7 MG/24HR PATCH    Place 1 patch (7 mg total) onto the skin daily.   OXYCODONE (OXY IR/ROXICODONE) 5 MG IMMEDIATE RELEASE TABLET    Take 0.5 tablets (2.5 mg total) by mouth every 4 (four) hours as needed for moderate pain.   PANTOPRAZOLE (PROTONIX) 40 MG TABLET    Take 40 mg by mouth 2 (two) times daily.   UMECLIDINIUM-VILANTEROL (ANORO ELLIPTA) 62.5-25 MCG/INH AEPB    Inhale 1 application into the  lungs daily.   Modified Medications   No medications on file  Discontinued Medications   No medications on file     Physical Exam: Filed Vitals:   12/30/14 1703  BP: 140/80  Pulse: 90  Temp: 97.3 F (36.3 C)  Resp: 18  SpO2: 96%    General- elderly female, in no acute distress Head- normocephalic, atraumatic Ears- left ear normal  normal external ear canal , right ear normal external ear canal Nose- normal nasal mucosa, no maxillary or frontal sinus tenderness, no nasal discharge Throat- moist mucus membrane  Eyes- PERRLA, EOMI, no pallor, no icterus, no discharge, normal conjunctiva, normal sclera Neck- no cervical lymphadenopathy Cardiovascular- normal s1,s2, no murmurs, palpable dorsalis pedis and radial pulses, no leg edema Respiratory- bilateral clear to auscultation, no wheeze, no rhonchi, no crackles, no use of accessory muscles Abdomen- bowel sounds present, soft, non tender Musculoskeletal- able to move all 4 extremities, generalized weakness left > right Neurological- alert and oriented to person, place and time, left face mild asymmetry Skin- warm and dry Psychiatry- normal mood and affect    Labs reviewed: Basic Metabolic Panel:  Recent Labs  13/24/40 0356  12/16/14 1546 12/17/14 0244 12/22/14 2132  NA 139  < > 139 140 137  K 4.0  < > 4.8 3.9 3.6  CL 103  < > 103 105 102  CO2 24  < > 27 26 24   GLUCOSE 94  < > 102* 121* 136*  BUN 13  < > 24* 21* 20  CREATININE 0.87  < > 1.01* 0.91 1.04*  CALCIUM 8.9  < > 9.2 9.1 9.7  MG 1.7  --   --   --   --   < > = values in this interval not displayed. Liver Function Tests:  Recent Labs  11/27/14 1827 12/16/14 1546 12/22/14 2132  AST 288* 123* 28  ALT 162* 81* 147*  ALKPHOS 81 71 84  BILITOT 1.0 1.8* 0.5  PROT 6.8 7.4 7.5  ALBUMIN 3.4* 3.9 3.5    Recent Labs  11/27/14 2022 12/16/14 1546  LIPASE 16* 14*   No results for input(s): AMMONIA in the last 8760 hours. CBC:  Recent Labs   05/17/14 1102  12/16/14 1622 12/17/14 0244 12/22/14 2132  WBC 12.8*  < > 13.9* 11.1* 13.8*  NEUTROABS 9.3*  --  9.8*  --  9.7*  HGB 13.6  < > 15.1* 14.8 15.7*  HCT 41.7  < > 46.3* 44.1 45.9  MCV 92.5  < > 89.7 90.2 88.6  PLT 353  < > 322 313 319  < > = values in this interval not displayed. Cardiac Enzymes:  Recent Labs  12/17/14 0244 12/17/14 0825 12/17/14 1433  TROPONINI <0.03 <0.03 <0.03   BNP: Invalid input(s): POCBNP CBG:  Recent Labs  12/16/14 2107  GLUCAP 135*    Radiological Exams: Dg Forearm Left  12/17/2014 CLINICAL DATA: Larey Seat yesterday. Pain in the left elbow. EXAM: LEFT FOREARM - 2 VIEW COMPARISON: None. FINDINGS: Radiopaque density is identified along the anteromedial aspect of the forearm and may represent foreign body or object external to the patient. There is no evidence for acute fracture or dislocation. No soft tissue gas. IMPRESSION: Possible foreign body in the soft tissues of the forearm. No fracture. If there is focal pain in the elbow consider dedicated views of the elbow. Electronically Signed By: Norva Pavlov M.D. On: 12/17/2014 14:45   Ct Head Wo Contrast  12/16/2014 CLINICAL DATA: Status post fall, generalized weakness EXAM: CT HEAD WITHOUT CONTRAST TECHNIQUE: Contiguous axial images were obtained from the base of the skull through the vertex without intravenous contrast. COMPARISON: 05/17/2014 FINDINGS: No evidence of parenchymal hemorrhage or extra-axial fluid collection. No mass lesion, mass effect, or midline shift. No CT evidence of acute infarction. Subcortical white matter and periventricular small vessel ischemic changes. Mild intracranial atherosclerosis. Cerebral volume is within normal limits. No  ventriculomegaly. The visualized paranasal sinuses are essentially clear. The mastoid air cells are unopacified. No evidence of calvarial fracture. IMPRESSION: No evidence of acute intracranial abnormality. Small  vessel ischemic changes. Electronically Signed By: Charline Bills M.D. On: 12/16/2014 23:15   Mr Maxine Glenn Head Wo Contrast  12/18/2014 CLINICAL DATA: Recent fall at home without head injury. No reported loss of consciousness. EXAM: MRI HEAD WITHOUT CONTRAST MRA HEAD WITHOUT CONTRAST TECHNIQUE: Multiplanar, multiecho pulse sequences of the brain and surrounding structures were obtained without intravenous contrast. Angiographic images of the head were obtained using MRA technique without contrast. COMPARISON: Head CT 12/16/2014 and MRI 09/18/2010 FINDINGS: MRI HEAD FINDINGS The examination had to be discontinued prior to completion due to patient's inability to tolerate being in the scanner any longer and inability to remain motionless. Patient declined further imaging and medication for anxiolysis. Axial and coronal diffusion, sagittal T1, and axial T2 weighted images were obtained. Anterior right temporal lobe is obscured on diffusion-weighted imaging due to susceptibility artifact. There are multiple patchy areas of acute infarction in the right MCA territory involving cortex and white matter of the posterior frontal lobe, parietal lobe, posterior temporal lobe, and insula/external capsule. There is associated mild cytotoxic edema without mass effect. No parenchymal hematoma is seen. There is no evidence of mass, midline shift, or extra-axial fluid collection. Ventricles and sulci are within normal limits for age. Patchy and confluent T2 hyperintensities in the subcortical and deep cerebral white matter and in the pons are nonspecific but compatible with rather extensive chronic small vessel ischemic disease. Chronic ischemic changes are also noted in the thalami. Chronic lacunar infarcts are present in the centrum semiovale bilaterally and in the anterior limb of the left internal capsule. Prior bilateral cataract extraction is noted. Small left sphenoid sinus mucous retention cyst is noted.  Mastoid air cells are clear. Major intracranial vascular flow voids are preserved. MRA HEAD FINDINGS Images are mildly to moderately motion degraded. The visualized distal vertebral arteries are patent and codominant. PICA and SCA origins are patent. Basilar artery is patent and mildly tortuous without evidence of significant stenosis. Posterior communicating arteries are not clearly identified. P1 and proximal P2 segments are patent bilaterally without evidence of significant stenosis. More distal PCAs are not well evaluated. Intracranial internal carotid arteries are patent without evidence of significant stenosis. ACAs are patent without evidence of proximal stenosis. M1 segments are patent without stenosis. M2 trunks are patent bilaterally. Evaluation of MCA branch vessels is limited due to degraded image quality, with suggestion of a slightly diminished number of more distal MCA branches on the left compared to the right. No intracranial aneurysm is identified. IMPRESSION: 1. Motion degraded, incomplete examination as above. 2. Patchy right MCA territory acute infarcts. No evidence of hemorrhage. 3. Advanced chronic small vessel ischemic disease. 4. No evidence of major intracranial arterial occlusion or significant proximal stenosis. Suboptimal branch vessel evaluation. Electronically Signed By: Sebastian Ache On: 12/18/2014 10:00   Mr Brain Wo Contrast  12/18/2014 CLINICAL DATA: Recent fall at home without head injury. No reported loss of consciousness. EXAM: MRI HEAD WITHOUT CONTRAST MRA HEAD WITHOUT CONTRAST TECHNIQUE: Multiplanar, multiecho pulse sequences of the brain and surrounding structures were obtained without intravenous contrast. Angiographic images of the head were obtained using MRA technique without contrast. COMPARISON: Head CT 12/16/2014 and MRI 09/18/2010 FINDINGS: MRI HEAD FINDINGS The examination had to be discontinued prior to completion due to patient's inability to  tolerate being in the scanner any longer and inability to remain motionless.  Patient declined further imaging and medication for anxiolysis. Axial and coronal diffusion, sagittal T1, and axial T2 weighted images were obtained. Anterior right temporal lobe is obscured on diffusion-weighted imaging due to susceptibility artifact. There are multiple patchy areas of acute infarction in the right MCA territory involving cortex and white matter of the posterior frontal lobe, parietal lobe, posterior temporal lobe, and insula/external capsule. There is associated mild cytotoxic edema without mass effect. No parenchymal hematoma is seen. There is no evidence of mass, midline shift, or extra-axial fluid collection. Ventricles and sulci are within normal limits for age. Patchy and confluent T2 hyperintensities in the subcortical and deep cerebral white matter and in the pons are nonspecific but compatible with rather extensive chronic small vessel ischemic disease. Chronic ischemic changes are also noted in the thalami. Chronic lacunar infarcts are present in the centrum semiovale bilaterally and in the anterior limb of the left internal capsule. Prior bilateral cataract extraction is noted. Small left sphenoid sinus mucous retention cyst is noted. Mastoid air cells are clear. Major intracranial vascular flow voids are preserved. MRA HEAD FINDINGS Images are mildly to moderately motion degraded. The visualized distal vertebral arteries are patent and codominant. PICA and SCA origins are patent. Basilar artery is patent and mildly tortuous without evidence of significant stenosis. Posterior communicating arteries are not clearly identified. P1 and proximal P2 segments are patent bilaterally without evidence of significant stenosis. More distal PCAs are not well evaluated. Intracranial internal carotid arteries are patent without evidence of significant stenosis. ACAs are patent without evidence of proximal stenosis. M1  segments are patent without stenosis. M2 trunks are patent bilaterally. Evaluation of MCA branch vessels is limited due to degraded image quality, with suggestion of a slightly diminished number of more distal MCA branches on the left compared to the right. No intracranial aneurysm is identified. IMPRESSION: 1. Motion degraded, incomplete examination as above. 2. Patchy right MCA territory acute infarcts. No evidence of hemorrhage. 3. Advanced chronic small vessel ischemic disease. 4. No evidence of major intracranial arterial occlusion or significant proximal stenosis. Suboptimal branch vessel evaluation. Electronically Signed By: Sebastian Ache On: 12/18/2014 10:00   Ct Elbow Left W/o Cm  12/17/2014 CLINICAL DATA: Fall, left elbow pain EXAM: CT OF THE LEFT ELBOW WITHOUT CONTRAST TECHNIQUE: Multidetector CT imaging was performed according to the standard protocol. Multiplanar CT image reconstructions were also generated. COMPARISON: Forearm radiograph performed earlier today FINDINGS: No fracture or dislocation. No joint effusion. IMPRESSION: Negative. No that the area of possible foreign body seen on radiograph is not included on this examination. Electronically Signed By: Esperanza Heir M.D. On: 12/17/2014 21:29   Dg Chest Port 1 View  12/17/2014 CLINICAL DATA: Fall, weakness EXAM: PORTABLE CHEST - 1 VIEW COMPARISON: CT chest dated 11/27/2014 FINDINGS: Lungs are essentially clear. No pleural effusion or pneumothorax. Cardiomegaly. IMPRESSION: No evidence of acute cardiopulmonary disease. Electronically Signed By: Charline Bills M.D. On: 12/17/2014 00:12   Mr Card Morphology Wo/w Cm  12/21/2014 CLINICAL DATA: 74 year old female post stroke with an incidental finding of a mass in the ascending aorta. EXAM: CARDIAC MRI TECHNIQUE: The patient was scanned on a 1.5 Tesla GE magnet. A dedicated cardiac coil was used. Functional imaging was done using Fiesta  sequences. 2,3, and 4 chamber views were done to assess for RWMA's. Modified Simpson's rule using a short axis stack was used to calculate an ejection fraction on a dedicated work [employer]. The patient received 24 cc of Multihance. After 10 minutes inversion recovery  sequences were used to assess for infiltration and scar tissue. CONTRAST: 24 cc of Multihance FINDINGS: 1. There is a small mass attached to the anterior wall of the ascending aorta measuring 9 x 8 mm. The mass is located just 4 mm above RCA ostium and 24 mm from the aortic annulus. There is no late gadolinium enhancement in the mass with regular and long TI times. There are no signs of invasion into the wall of the ascending aorta. No other masses are seen. 2. 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 Aortic root has normal size measuring 34 x 33 x 33 mm. Aortic arch, descending thoracic aorta and pulmonary artery have normal size. IMPRESSION: 1. A small mass attached to the anterior wall of the ascending aorta measuring 9 x 8 mm is located just above RCA ostium with no signs of invasion into the vessel wall. No other masses were seen. No late gadolinium enhancement in present in the mass. Collectively, these findings are consistent with a small thrombus. 2. There is mild aneurysmal dilatation of the ascending aorta measuring 43 x 41 mm. A repeat scan (CTA or MRA) is recommended in 1 year. Tobias Alexander Electronically Signed By: Tobias Alexander On: 12/21/2014 19:01   2-D echocardiogram Study Conclusions - Left ventricle: The study is technically very limited. However, LV and RV function seem OK. The cavity size was normal. Wall thickness was increased in a pattern of mild LVH. The estimated ejection fraction was 55%. Images were inadequate for LV wall motion assessment. - Aortic valve: Poorly visualized. The valve appears to be grossly normal. - Mitral valve: Poorly  visualized. The valve appears to be grossly normal. - Right ventricle: Poorly visualized. The cavity size was normal. Systolic function was normal. - Impressions: No cardiac source of embolism was identified, but cannot be ruled out on the basis of this examination. Impressions: - No cardiac source of embolism was identified, but cannot be ruled out on the basis of this examination.  TEE 10 x10 mm high mobile echodense mass is seen attached to the aortic wall at the level of the sinotubular junction, directly above the ostium of the right coronary artery. It appears to be associated with an atherosclerotic plaque. Otherwise normal study (normal LV EF, small left atrium without thrombus, normal valves, no PFO or ASD). Incidental note of severe lipomatous hypertrophy of the atrial septum.    Assessment/Plan  Physical deconditioning Will have her work with physical therapy and occupational therapy team to help with gait training and muscle strengthening exercises.fall precautions. Skin care. Encourage to be out of bed.   Acute CVA Continue aspirin and plavix for 3 months and then plavix only. Continue lipitor 20 mg daily and bp medication. Continue prn baclofen for muscle spasm and to work with therapy team  Dysphagia Continue SLP follow up, on regular diet with thin liquid at present, aspiration precautions  Dm type 2 Monitor cbg, continue metformin 500 mg bid  Tobacco use Continue nicotine patch, counselled on smoking cessation  Hearing loss in left ear Post cva, ENT referral  Dysuria Send u/a with c/s. Start pyridium 200 mg tid with meals for 3 days to help with dysuria  Blurred vision Post cva, will get ophthalmology referral for further evaluation  Constipation Add colace 100 mg bid and monitor  Leukocytosis Possible uti vs reactive leukocytosis, u/a to be sent, monitor clinically  Anemia Monitor h&h  Impaired renal function Monitor renal function for  now  DDD Stable, continue celecoxib with oxyIR and prn tylenol current regimen and monitor   RLS  continue gabapentin 100 mg tid  HTN Stable, continue norvasc 5 mg daily, losartan 100 mg daily  Depression  Stable mood, continue wellbutrin xl 300 mg daily and prozac 40 mg daily  Hypothyroidism Stable, continue levothyroxine 200 mcg daily  gerd Continue protonix 40 mg bid and monitor  Mild aneurysmal dilatation of the ascending aorta Will need repeat CT scan angiogram or MRA in 1 year.   Goals of care: short term rehabilitation   Labs/tests ordered: cbc, u/a, urine c/s  Family/ staff Communication: reviewed care plan with patient and nursing supervisor    Oneal Grout, MD  [facility] [phone] (Monday-Friday 8 am - 5 pm) [phone] (afterhours)

## 2015-01-02 ENCOUNTER — Ambulatory Visit: Payer: Medicare Other

## 2015-01-09 ENCOUNTER — Non-Acute Institutional Stay (SKILLED_NURSING_FACILITY): Payer: Medicare Other | Admitting: Nurse Practitioner

## 2015-01-09 DIAGNOSIS — E039 Hypothyroidism, unspecified: Secondary | ICD-10-CM | POA: Diagnosis not present

## 2015-01-09 DIAGNOSIS — E119 Type 2 diabetes mellitus without complications: Secondary | ICD-10-CM | POA: Diagnosis not present

## 2015-01-09 DIAGNOSIS — F329 Major depressive disorder, single episode, unspecified: Secondary | ICD-10-CM | POA: Diagnosis not present

## 2015-01-09 DIAGNOSIS — I639 Cerebral infarction, unspecified: Secondary | ICD-10-CM | POA: Diagnosis not present

## 2015-01-09 DIAGNOSIS — I712 Thoracic aortic aneurysm, without rupture: Secondary | ICD-10-CM

## 2015-01-09 DIAGNOSIS — F17200 Nicotine dependence, unspecified, uncomplicated: Secondary | ICD-10-CM

## 2015-01-09 DIAGNOSIS — F32A Depression, unspecified: Secondary | ICD-10-CM

## 2015-01-09 DIAGNOSIS — I7121 Aneurysm of the ascending aorta, without rupture: Secondary | ICD-10-CM

## 2015-01-09 DIAGNOSIS — I10 Essential (primary) hypertension: Secondary | ICD-10-CM | POA: Diagnosis not present

## 2015-01-09 DIAGNOSIS — N390 Urinary tract infection, site not specified: Secondary | ICD-10-CM | POA: Diagnosis not present

## 2015-01-09 NOTE — Progress Notes (Signed)
Patient ID: [name] [name], female   DOB: 12-Aug-1940, 74 y.o.   MRN: [id number]    Nursing Home Location:  [facility]   Place of Service: SNF (31)  PCP: Ezequiel Kayser, MD  Allergies  Allergen Reactions  . Codeine Other (See Comments)    Real bad chest pains  . Lisinopril Cough  . Demerol Nausea Only  . Penicillins Rash and Other (See Comments)    Pt not sure "chestpains"    Chief Complaint  Patient presents with  . Discharge Note    HPI:  Patient is a 74 y.o. female seen today at [facility] for discharge home. Pt with a pmh of  HTN, hypothyroidism, aortic aneurysm. Pt was hospitalized due to syncopal episode, underwent stroke workup. TEE revealed a mobile, 10 x 10 mm mass in the aortic wall thought to be a thrombus (confirmed by MRI). Pt was discharged to [facility] for rehab. While at [facility] found to have UTI has has been treated with Macrobid, treatment to complete today. No further symptoms noted.  Patient currently doing well with therapy, now stable to discharge home with home health.  Review of Systems:  Review of Systems  Constitutional: Negative for activity change, appetite change, fatigue and unexpected weight change.  HENT: Negative for congestion and hearing loss.   Eyes: Negative.   Respiratory: Negative for cough and shortness of breath.   Cardiovascular: Negative for chest pain, palpitations and leg swelling.  Gastrointestinal: Negative for abdominal pain, diarrhea and constipation.  Genitourinary: Negative for dysuria and difficulty urinating.  Musculoskeletal: Negative for myalgias and arthralgias.  Skin: Negative for color change and wound.  Neurological: Positive for weakness (improving). Negative for dizziness.  Psychiatric/Behavioral: Negative for behavioral problems, confusion and agitation.    Past Medical History  Diagnosis Date  . HTN (hypertension)   . Ascending aortic aneurysm   . Dyslipidemia   . Tobacco  dependence   . Hypothyroidism   . Depression   . Neurogenic bladder   . Obesity   . Dog bite(E906.0)     RLL with systemic inflammatory response  . Chronic low back pain   . Anxiety   . MS (multiple sclerosis)     Followed by Dr. Sandria Manly  . Arthritis   . History of ulcer disease 30 yrs ago  . IBS (irritable bowel syndrome)   . Incontinence in female   . Obstructive sleep apnea     a. compliant with CPAP   Past Surgical History  Procedure Laterality Date  . Cholecystectomy    . Hysterectomy    . Nasal sinus surgery    . Left eye surgery    . Bilateral foot surgery    . Leg surgery  2006    due to fx leg   . Back surgery    . Total knee arthroplasty Left 04/11/2014    Procedure: LEFT TOTAL KNEE ARTHROPLASTY;  Surgeon: Shelda Pal, MD;  Location: [facility]S;  Service: Orthopedics;  Laterality: Left;  . Tee without cardioversion N/A 12/21/2014    Procedure: TRANSESOPHAGEAL ECHOCARDIOGRAM (TEE);  Surgeon: Thurmon Fair, MD;  Location: [facility] ENDOSCOPY;  Service: Cardiovascular;  Laterality: N/A;   Social History:   reports that she has been smoking Cigarettes.  She has a 15 pack-year smoking history. She has never used smokeless tobacco. She reports that she drinks alcohol. She reports that she does not use illicit drugs.  Family History  Problem Relation Age of Onset  .  Cancer Father   . Emphysema Father     Medications: Patient's Medications  New Prescriptions   No medications on file  Previous Medications   ACETAMINOPHEN (TYLENOL) 325 MG TABLET    Take 2 tablets (650 mg total) by mouth every 6 (six) hours as needed for mild pain (or Fever >/= 101).   AMLODIPINE (NORVASC) 5 MG TABLET    Take 5 mg by mouth daily.   ASPIRIN EC 81 MG EC TABLET    Take 1 tablet (81 mg total) by mouth daily. For 3 months. And then Plavix alone.   ATORVASTATIN (LIPITOR) 20 MG TABLET    Take 20 mg by mouth daily.   BACLOFEN (LIORESAL) 10 MG TABLET    Take 10 mg by mouth 2 (two) times daily as needed  for muscle spasms.   BUPROPION (WELLBUTRIN XL) 300 MG 24 HR TABLET    Take 1 tablet by mouth daily.    CELECOXIB (CELEBREX) 200 MG CAPSULE    Take 1 capsule (200 mg total) by mouth daily.   CHOLECALCIFEROL (VITAMIN D) 2000 UNITS TABLET    Take 4,000 Units by mouth 2 (two) times daily.    CLONAZEPAM (KLONOPIN) 1 MG TABLET    Take 1 tablet (1 mg total) by mouth at bedtime.   CLOPIDOGREL (PLAVIX) 75 MG TABLET    Take 1 tablet (75 mg total) by mouth daily.   CYANOCOBALAMIN (VITAMIN B 12 PO)    Take 20,000-25,000 mcg by mouth every morning.   DICLOFENAC SODIUM (VOLTAREN) 1 % GEL    Apply 2 g topically every 8 (eight) hours as needed (pain).   DIPHENOXYLATE-ATROPINE (LOMOTIL) 2.5-0.025 MG PER TABLET    Take 2 tablets by mouth 4 (four) times daily as needed for diarrhea or loose stools.   FLUOXETINE (PROZAC) 40 MG CAPSULE    Take 40 mg by mouth every morning.    GABAPENTIN (NEURONTIN) 100 MG CAPSULE    Take 1 capsule (100 mg total) by mouth every 8 (eight) hours.   LEVOTHYROXINE (SYNTHROID, LEVOTHROID) 200 MCG TABLET    Take 200 mcg by mouth daily before breakfast.   LOSARTAN (COZAAR) 100 MG TABLET    Take 1 tablet by mouth every morning.    METFORMIN (GLUCOPHAGE) 500 MG TABLET    Take 1 tablet (500 mg total) by mouth 2 (two) times daily with a meal.   NICOTINE (NICODERM CQ - DOSED IN MG/24 HR) 7 MG/24HR PATCH    Place 1 patch (7 mg total) onto the skin daily.   OXYCODONE (OXY IR/ROXICODONE) 5 MG IMMEDIATE RELEASE TABLET    Take 0.5 tablets (2.5 mg total) by mouth every 4 (four) hours as needed for moderate pain.   PANTOPRAZOLE (PROTONIX) 40 MG TABLET    Take 40 mg by mouth 2 (two) times daily.   UMECLIDINIUM-VILANTEROL (ANORO ELLIPTA) 62.5-25 MCG/INH AEPB    Inhale 1 application into the lungs daily.   Modified Medications   No medications on file  Discontinued Medications   No medications on file     Physical Exam: Filed Vitals:   01/09/15 1226  BP: 108/69  Pulse: 86  Temp: 98.6 F (37 C)    Resp: 20    Physical Exam  Constitutional: She is oriented to person, place, and time. She appears well-developed and well-nourished. No distress.  HENT:  Head: Normocephalic and atraumatic.  Mouth/Throat: Oropharynx is clear and moist. No oropharyngeal exudate.  Eyes: Conjunctivae are normal. Pupils are equal, round, and reactive to  light.  Neck: Normal range of motion. Neck supple.  Cardiovascular: Normal rate, regular rhythm and normal heart sounds.   Pulmonary/Chest: Effort normal and breath sounds normal.  Abdominal: Soft. Bowel sounds are normal.  Musculoskeletal: She exhibits no edema or tenderness.  Neurological: She is alert and oriented to person, place, and time.  Skin: Skin is warm and dry. She is not diaphoretic.  Psychiatric: She has a normal mood and affect.    Labs reviewed: Basic Metabolic Panel:  Recent Labs  89/16/94 0356  12/16/14 1546 12/17/14 0244 12/22/14 2132  NA 139  < > 139 140 137  K 4.0  < > 4.8 3.9 3.6  CL 103  < > 103 105 102  CO2 24  < > 27 26 24   GLUCOSE 94  < > 102* 121* 136*  BUN 13  < > 24* 21* 20  CREATININE 0.87  < > 1.01* 0.91 1.04*  CALCIUM 8.9  < > 9.2 9.1 9.7  MG 1.7  --   --   --   --   < > = values in this interval not displayed. Liver Function Tests:  Recent Labs  11/27/14 1827 12/16/14 1546 12/22/14 2132  AST 288* 123* 28  ALT 162* 81* 147*  ALKPHOS 81 71 84  BILITOT 1.0 1.8* 0.5  PROT 6.8 7.4 7.5  ALBUMIN 3.4* 3.9 3.5    Recent Labs  11/27/14 2022 12/16/14 1546  LIPASE 16* 14*   No results for input(s): AMMONIA in the last 8760 hours. CBC:  Recent Labs  05/17/14 1102  12/16/14 1622 12/17/14 0244 12/22/14 2132  WBC 12.8*  < > 13.9* 11.1* 13.8*  NEUTROABS 9.3*  --  9.8*  --  9.7*  HGB 13.6  < > 15.1* 14.8 15.7*  HCT 41.7  < > 46.3* 44.1 45.9  MCV 92.5  < > 89.7 90.2 88.6  PLT 353  < > 322 313 319  < > = values in this interval not displayed. TSH:  Recent Labs  05/18/14 1045 12/17/14 0244   TSH 0.932 0.581   A1C: Lab Results  Component Value Date   HGBA1C 6.5* 12/20/2014   Lipid Panel:  Recent Labs  12/19/14 0340  CHOL 163  HDL 52  LDLCALC 96  TRIG 76  CHOLHDL 3.1     Assessment/Plan 1. Essential hypertension Blood pressure moderately controlled, cont norvasc, may need adjustment as outpatient, PCP to follow up.    2. Stroke Confirmed thrombus, followed by neurology in the hospital.  Neurology recommended aspirin and Plavix for 3 months, followed by Plavix alone.  Working with rehab to improve strength     3. Hypothyroidism, unspecified hypothyroidism type TSH stable in June, to cont synthroid 200 mcg  4. Depression Stable, cont prozac, wellbutrin  5. Tobacco dependence conts on nicotine patch, not to smoke while on patch  6. UTI (lower urinary tract infection) Improved symptoms, completes treatment today   7. Type 2 diabetes mellitus without complication Pt had been declining metformin, discussed need for proper blood sugar control and elevated blood sugars, to cont metformin BID and follow up with PCP  8. Mild aneurysmal dilatation of the ascending aorta Found during hospitalization, Will need repeat CT scan angiogram or MRA in 1 year  pt is stable for discharge-will need PT/OT per home health. No DME needed. Rx written.  will need to follow up with PCP within 2 weeks.    Janene Harvey. Biagio Borg  [facility]  [phone] 8 am - 5 pm) [phone] (after hours)

## 2015-01-14 ENCOUNTER — Emergency Department (HOSPITAL_COMMUNITY)
Admission: EM | Admit: 2015-01-14 | Discharge: 2015-01-15 | Disposition: A | Discharge status: Home / Self Care | Payer: Medicare Other | Attending: Emergency Medicine | Admitting: Emergency Medicine

## 2015-01-14 DIAGNOSIS — F419 Anxiety disorder, unspecified: Secondary | ICD-10-CM | POA: Diagnosis not present

## 2015-01-14 DIAGNOSIS — N390 Urinary tract infection, site not specified: Secondary | ICD-10-CM | POA: Diagnosis not present

## 2015-01-14 DIAGNOSIS — G8929 Other chronic pain: Secondary | ICD-10-CM | POA: Insufficient documentation

## 2015-01-14 DIAGNOSIS — M1990 Unspecified osteoarthritis, unspecified site: Secondary | ICD-10-CM | POA: Insufficient documentation

## 2015-01-14 DIAGNOSIS — E039 Hypothyroidism, unspecified: Secondary | ICD-10-CM | POA: Insufficient documentation

## 2015-01-14 DIAGNOSIS — Z7982 Long term (current) use of aspirin: Secondary | ICD-10-CM | POA: Insufficient documentation

## 2015-01-14 DIAGNOSIS — F329 Major depressive disorder, single episode, unspecified: Secondary | ICD-10-CM | POA: Insufficient documentation

## 2015-01-14 DIAGNOSIS — R0902 Hypoxemia: Secondary | ICD-10-CM | POA: Insufficient documentation

## 2015-01-14 DIAGNOSIS — E669 Obesity, unspecified: Secondary | ICD-10-CM | POA: Insufficient documentation

## 2015-01-14 DIAGNOSIS — I509 Heart failure, unspecified: Secondary | ICD-10-CM | POA: Diagnosis not present

## 2015-01-14 DIAGNOSIS — Z720 Tobacco use: Secondary | ICD-10-CM | POA: Insufficient documentation

## 2015-01-14 DIAGNOSIS — I10 Essential (primary) hypertension: Secondary | ICD-10-CM | POA: Insufficient documentation

## 2015-01-14 DIAGNOSIS — Z7902 Long term (current) use of antithrombotics/antiplatelets: Secondary | ICD-10-CM | POA: Insufficient documentation

## 2015-01-14 DIAGNOSIS — E785 Hyperlipidemia, unspecified: Secondary | ICD-10-CM | POA: Insufficient documentation

## 2015-01-14 DIAGNOSIS — G4733 Obstructive sleep apnea (adult) (pediatric): Secondary | ICD-10-CM | POA: Insufficient documentation

## 2015-01-14 DIAGNOSIS — R7981 Abnormal blood-gas level: Secondary | ICD-10-CM

## 2015-01-14 DIAGNOSIS — Z79899 Other long term (current) drug therapy: Secondary | ICD-10-CM | POA: Insufficient documentation

## 2015-01-14 DIAGNOSIS — Z880 Allergy status to penicillin: Secondary | ICD-10-CM | POA: Diagnosis not present

## 2015-01-14 DIAGNOSIS — Z9981 Dependence on supplemental oxygen: Secondary | ICD-10-CM | POA: Insufficient documentation

## 2015-01-14 DIAGNOSIS — R109 Unspecified abdominal pain: Secondary | ICD-10-CM | POA: Diagnosis present

## 2015-01-14 HISTORY — DX: Type 2 diabetes mellitus without complications: E11.9

## 2015-01-14 NOTE — ED Notes (Signed)
Per EMS, Pt called out, family member thought the pt was having a CVA, pt had CVA on June 25th of this year. Pt's previous CVA affected left side and pt went to rehab and corrected weakness. Pt negative on EMS stroke screen with no weakness to either side. Pt at baseline. EMS VS 136/58, HR 80 NSR with BBB. Pt complaining of upper abd pain, pt has hx of AAA without dissection. Pedal pulses palpated and good. Pt stable and in NAD. CBG 130. Pt maintains a urinary tract infection.

## 2015-01-15 ENCOUNTER — Emergency Department (HOSPITAL_COMMUNITY): Payer: Medicare Other

## 2015-01-15 ENCOUNTER — Encounter (HOSPITAL_COMMUNITY): Payer: Self-pay

## 2015-01-15 LAB — URINALYSIS, ROUTINE W REFLEX MICROSCOPIC
BILIRUBIN URINE: NEGATIVE
Glucose, UA: NEGATIVE mg/dL
Hgb urine dipstick: NEGATIVE
KETONES UR: NEGATIVE mg/dL
NITRITE: POSITIVE — AB
Protein, ur: NEGATIVE mg/dL
Specific Gravity, Urine: 1.011 (ref 1.005–1.030)
Urobilinogen, UA: 1 mg/dL (ref 0.0–1.0)
pH: 7 (ref 5.0–8.0)

## 2015-01-15 LAB — CBC WITH DIFFERENTIAL/PLATELET
BASOS ABS: 0.1 10*3/uL (ref 0.0–0.1)
BASOS PCT: 0 % (ref 0–1)
Eosinophils Absolute: 0.1 10*3/uL (ref 0.0–0.7)
Eosinophils Relative: 1 % (ref 0–5)
HEMATOCRIT: 43.3 % (ref 36.0–46.0)
Hemoglobin: 14.4 g/dL (ref 12.0–15.0)
Lymphocytes Relative: 20 % (ref 12–46)
Lymphs Abs: 2.5 10*3/uL (ref 0.7–4.0)
MCH: 30.4 pg (ref 26.0–34.0)
MCHC: 33.3 g/dL (ref 30.0–36.0)
MCV: 91.4 fL (ref 78.0–100.0)
MONO ABS: 1.1 10*3/uL — AB (ref 0.1–1.0)
Monocytes Relative: 9 % (ref 3–12)
NEUTROS ABS: 8.8 10*3/uL — AB (ref 1.7–7.7)
Neutrophils Relative %: 70 % (ref 43–77)
Platelets: 317 10*3/uL (ref 150–400)
RBC: 4.74 MIL/uL (ref 3.87–5.11)
RDW: 14 % (ref 11.5–15.5)
WBC: 12.6 10*3/uL — AB (ref 4.0–10.5)

## 2015-01-15 LAB — COMPREHENSIVE METABOLIC PANEL
ALBUMIN: 3.4 g/dL — AB (ref 3.5–5.0)
ALT: 25 U/L (ref 14–54)
AST: 19 U/L (ref 15–41)
Alkaline Phosphatase: 70 U/L (ref 38–126)
Anion gap: 9 (ref 5–15)
BUN: 17 mg/dL (ref 6–20)
CALCIUM: 9.3 mg/dL (ref 8.9–10.3)
CO2: 27 mmol/L (ref 22–32)
CREATININE: 0.95 mg/dL (ref 0.44–1.00)
Chloride: 101 mmol/L (ref 101–111)
GFR calc Af Amer: >60 mL/min (ref >60)
GFR calc non Af Amer: 59 mL/min — ABNORMAL LOW (ref >60)
Glucose, Bld: 144 mg/dL — ABNORMAL HIGH (ref 65–99)
Potassium: 4 mmol/L (ref 3.5–5.1)
SODIUM: 137 mmol/L (ref 135–145)
TOTAL PROTEIN: 6.6 g/dL (ref 6.5–8.1)
Total Bilirubin: 0.7 mg/dL (ref 0.3–1.2)

## 2015-01-15 LAB — URINE MICROSCOPIC-ADD ON

## 2015-01-15 LAB — BRAIN NATRIURETIC PEPTIDE: B Natriuretic Peptide: 36.3 pg/mL (ref 0.0–100.0)

## 2015-01-15 LAB — I-STAT TROPONIN, ED: TROPONIN I, POC: 0 ng/mL (ref 0.00–0.08)

## 2015-01-15 LAB — LIPASE, BLOOD: Lipase: 17 U/L — ABNORMAL LOW (ref 22–51)

## 2015-01-15 MED ORDER — IOHEXOL 300 MG/ML  SOLN
25.0000 mL | Freq: Once | INTRAMUSCULAR | Status: AC | PRN
  Administered 2015-01-15: 25 mL via ORAL

## 2015-01-15 MED ORDER — ONDANSETRON HCL 4 MG/2ML IJ SOLN
4.0000 mg | Freq: Once | INTRAMUSCULAR | Status: AC
  Administered 2015-01-15: 4 mg via INTRAVENOUS
  Filled 2015-01-15: qty 2

## 2015-01-15 MED ORDER — SODIUM CHLORIDE 0.9 % IV BOLUS (SEPSIS)
1000.0000 mL | Freq: Once | INTRAVENOUS | Status: AC
Start: 2015-01-15 — End: 2015-01-15
  Administered 2015-01-15: 1000 mL via INTRAVENOUS

## 2015-01-15 MED ORDER — CIPROFLOXACIN IN D5W 400 MG/200ML IV SOLN
400.0000 mg | Freq: Once | INTRAVENOUS | Status: AC
  Administered 2015-01-15: 400 mg via INTRAVENOUS
  Filled 2015-01-15: qty 200

## 2015-01-15 MED ORDER — CIPROFLOXACIN HCL 500 MG PO TABS: 500.0000 mg | ORAL_TABLET | Freq: Two times a day (BID) | ORAL | Status: DC

## 2015-01-15 MED ORDER — ACETAMINOPHEN 500 MG PO TABS
1000.0000 mg | ORAL_TABLET | Freq: Once | ORAL | Status: AC
  Administered 2015-01-15: 1000 mg via ORAL
  Filled 2015-01-15: qty 2

## 2015-01-15 MED ORDER — IOHEXOL 300 MG/ML  SOLN
100.0000 mL | Freq: Once | INTRAMUSCULAR | Status: AC | PRN
  Administered 2015-01-15: 100 mL via INTRAVENOUS

## 2015-01-15 MED ORDER — ONDANSETRON 4 MG PO TBDP: 4.0000 mg | ORAL_TABLET | Freq: Three times a day (TID) | ORAL | Status: AC | PRN

## 2015-01-15 NOTE — ED Provider Notes (Addendum)
Medical screening examination/treatment/procedure(s) were conducted as a shared visit with non-physician practitioner(s) and myself.  I personally evaluated the patient during the encounter.   EKG Interpretation   Date/Time:  Sunday January 15 2015 06:10:36 EDT Ventricular Rate:  69 PR Interval:  53 QRS Duration: 154 QT Interval:  441 QTC Calculation: 472 R Axis:   -44 Text Interpretation:  Sinus rhythm Short PR interval Right bundle branch  block No significant change since last tracing Confirmed by Sedra Morfin,  DO,  Mckennon Zwart ([id number]) on 01/15/2015 6:15:22 AM      Pt is a 74 y.o. female with history of hypertension, MS, previous stroke who presents to the emergency department with complaint of abdominal pain and vomiting. Also complaining of cough, headache. Her workup in the emergency department has been unremarkable. CT head is unremarkable. CT of her abdomen and pelvis shows chronic dilation of the common bile duct which is unchanged. LFTs normal. Chest x-ray shows mild interstitial edema. Patient reports her abdominal pain, nausea have improved. She had one episode of hypoxia in the emergency. Denies chest pain or shortness of breath. EKG shows right bundle branch block which is old. No new ischemic changes. Lungs are clear to auscultation. Does not wear oxygen at home. Troponin negative.  Able to ambulate off oxygen without becoming hypoxic. I feel she is safe to be discharged home on antibiotics that she does have a nitrite positive urinary tract infection. Culture is pending.  Layla Maw Kariel Skillman, DO 01/15/15 [phone]   There is concern that patient may have been hypoxic but on reevaluation it appears that her pulse ox was not attached on her finger. Patient is comfortable with plan for discharge home. Discussed return precautions.  Layla Maw Deaun Rocha, DO 01/15/15 [phone]

## 2015-01-15 NOTE — ED Notes (Signed)
Placed pt on 2L of O2, due to SPO2 range between 88%-89%. Informed RN.

## 2015-01-15 NOTE — Discharge Instructions (Signed)
Take Cipro as directed until gone. Refer to attached documents for more information. Return to the ED with worsening or concerning symptoms.

## 2015-01-15 NOTE — ED Provider Notes (Signed)
CSN: [id number]     Arrival date & time 01/14/15  2355 History   First MD Initiated Contact with Patient 01/15/15 0008     Chief Complaint  Patient presents with  . Abdominal Pain  . Nausea     (Consider location/radiation/quality/duration/timing/severity/associated sxs/prior Treatment) HPI Comments: Patient is a 74 year old female with a past medical history of hypertension, MS, AAA, and OSA who presents with abdominal pain that started today. The pain is located in the epigastrium and does not radiate. The pain is described as aching and severe. The pain started gradually and progressively worsened since the onset. No alleviating/aggravating factors. The patient has tried nothing for symptoms without relief. Associated symptoms include nausea. Patient denies fever, headache, vomiting, diarrhea, chest pain, SOB, dysuria, constipation. Patient's family called EMS earlier because they thought she was having a stroke (unknown symptoms at that time). Patient a poor historian and she denies any weakness or numbness at this time. Patient had a previous CVA 1 month ago and was sent to rehab to correct her left side weakness.      Past Medical History  Diagnosis Date  . HTN (hypertension)   . Ascending aortic aneurysm   . Dyslipidemia   . Tobacco dependence   . Hypothyroidism   . Depression   . Neurogenic bladder   . Obesity   . Dog bite(E906.0)     RLL with systemic inflammatory response  . Chronic low back pain   . Anxiety   . MS (multiple sclerosis)     Followed by Dr. Sandria Manly  . Arthritis   . History of ulcer disease 30 yrs ago  . IBS (irritable bowel syndrome)   . Incontinence in female   . Obstructive sleep apnea     a. compliant with CPAP   Past Surgical History  Procedure Laterality Date  . Cholecystectomy    . Hysterectomy    . Nasal sinus surgery    . Left eye surgery    . Bilateral foot surgery    . Leg surgery  2006    due to fx leg   . Back surgery    . Total knee  arthroplasty Left 04/11/2014    Procedure: LEFT TOTAL KNEE ARTHROPLASTY;  Surgeon: Shelda Pal, MD;  Location: [facility] ORS;  Service: Orthopedics;  Laterality: Left;  . Tee without cardioversion N/A 12/21/2014    Procedure: TRANSESOPHAGEAL ECHOCARDIOGRAM (TEE);  Surgeon: Thurmon Fair, MD;  Location: [facility] ENDOSCOPY;  Service: Cardiovascular;  Laterality: N/A;   Family History  Problem Relation Age of Onset  . Cancer Father   . Emphysema Father    History  Substance Use Topics  . Smoking status: Current Every Day Smoker -- 0.50 packs/day for 30 years    Types: Cigarettes  . Smokeless tobacco: Never Used  . Alcohol Use: Yes     Comment: Rare alcohol use   OB History    No data available     Review of Systems  Constitutional: Negative for fever, chills and fatigue.  HENT: Negative for trouble swallowing.   Eyes: Negative for visual disturbance.  Respiratory: Negative for shortness of breath.   Cardiovascular: Negative for chest pain and palpitations.  Gastrointestinal: Positive for nausea and abdominal pain. Negative for vomiting and diarrhea.  Genitourinary: Negative for dysuria and difficulty urinating.  Musculoskeletal: Negative for arthralgias and neck pain.  Skin: Negative for color change.  Neurological: Negative for dizziness and weakness.  Psychiatric/Behavioral: Negative for dysphoric mood.  Allergies  Codeine; Lisinopril; Demerol; and Penicillins  Home Medications   Prior to Admission medications   Medication Sig Start Date End Date Taking? Authorizing Provider  acetaminophen (TYLENOL) 325 MG tablet Take 2 tablets (650 mg total) by mouth every 6 (six) hours as needed for mild pain (or Fever >/= 101). 12/22/14   Osvaldo Shipper, MD  amLODipine (NORVASC) 5 MG tablet Take 5 mg by mouth daily.    Historical Provider, MD  aspirin EC 81 MG EC tablet Take 1 tablet (81 mg total) by mouth daily. For 3 months. And then Plavix alone. 12/22/14   Osvaldo Shipper, MD   atorvastatin (LIPITOR) 20 MG tablet Take 20 mg by mouth daily.    Historical Provider, MD  baclofen (LIORESAL) 10 MG tablet Take 10 mg by mouth 2 (two) times daily as needed for muscle spasms.    Historical Provider, MD  buPROPion (WELLBUTRIN XL) 300 MG 24 hr tablet Take 1 tablet by mouth daily.  01/17/14   Historical Provider, MD  celecoxib (CELEBREX) 200 MG capsule Take 1 capsule (200 mg total) by mouth daily. 12/22/14   Osvaldo Shipper, MD  Cholecalciferol (VITAMIN D) 2000 UNITS tablet Take 4,000 Units by mouth 2 (two) times daily.     Historical Provider, MD  clonazePAM (KLONOPIN) 1 MG tablet Take 1 tablet (1 mg total) by mouth at bedtime. 12/23/14   Tiffany L Reed, DO  clopidogrel (PLAVIX) 75 MG tablet Take 1 tablet (75 mg total) by mouth daily. 12/22/14   Osvaldo Shipper, MD  Cyanocobalamin (VITAMIN B 12 PO) Take 20,000-25,000 mcg by mouth every morning.    Historical Provider, MD  diclofenac sodium (VOLTAREN) 1 % GEL Apply 2 g topically every 8 (eight) hours as needed (pain).    Historical Provider, MD  diphenoxylate-atropine (LOMOTIL) 2.5-0.025 MG per tablet Take 2 tablets by mouth 4 (four) times daily as needed for diarrhea or loose stools. 12/23/14   Tiffany L Reed, DO  FLUoxetine (PROZAC) 40 MG capsule Take 40 mg by mouth every morning.     Historical Provider, MD  gabapentin (NEURONTIN) 100 MG capsule Take 1 capsule (100 mg total) by mouth every 8 (eight) hours. 12/22/14   Osvaldo Shipper, MD  levothyroxine (SYNTHROID, LEVOTHROID) 200 MCG tablet Take 200 mcg by mouth daily before breakfast.    Historical Provider, MD  losartan (COZAAR) 100 MG tablet Take 1 tablet by mouth every morning.  10/15/11   Historical Provider, MD  metFORMIN (GLUCOPHAGE) 500 MG tablet Take 1 tablet (500 mg total) by mouth 2 (two) times daily with a meal. 12/22/14   Osvaldo Shipper, MD  nicotine (NICODERM CQ - DOSED IN MG/24 HR) 7 mg/24hr patch Place 1 patch (7 mg total) onto the skin daily. 12/22/14   Osvaldo Shipper, MD   oxyCODONE (OXY IR/ROXICODONE) 5 MG immediate release tablet Take 0.5 tablets (2.5 mg total) by mouth every 4 (four) hours as needed for moderate pain. 12/23/14   Tiffany L Reed, DO  pantoprazole (PROTONIX) 40 MG tablet Take 40 mg by mouth 2 (two) times daily.    Historical Provider, MD  Umeclidinium-Vilanterol (ANORO ELLIPTA) 62.5-25 MCG/INH AEPB Inhale 1 application into the lungs daily.     Historical Provider, MD   BP 129/74 mmHg  Pulse 73  Temp(Src) 97.8 F (36.6 C) (Oral)  Resp 19  SpO2 95% Physical Exam  Constitutional: She appears well-developed and well-nourished. No distress.  HENT:  Head: Normocephalic and atraumatic.  Eyes: Conjunctivae and EOM are normal.  Neck: Normal  range of motion. Neck supple.  Cardiovascular: Normal rate and regular rhythm.  Exam reveals no gallop and no friction rub.   No murmur heard. Pulmonary/Chest: Effort normal and breath sounds normal. She has no wheezes. She has no rales. She exhibits no tenderness.  Abdominal: Soft. There is tenderness.  Epigastric and RLQ tenderness to palpation. No other focal tenderness to palpation or peritoneal signs.   Musculoskeletal: Normal range of motion.  Neurological: She is alert. Coordination normal.  Speech is goal-oriented. Moves limbs without ataxia.   Skin: Skin is warm and dry.  Psychiatric: She has a normal mood and affect. Her behavior is normal.  Nursing note and vitals reviewed.   ED Course  Procedures (including critical care time) Labs Review Labs Reviewed  CBC WITH DIFFERENTIAL/PLATELET - Abnormal; Notable for the following:    WBC 12.6 (*)    Neutro Abs 8.8 (*)    Monocytes Absolute 1.1 (*)    All other components within normal limits  COMPREHENSIVE METABOLIC PANEL - Abnormal; Notable for the following:    Glucose, Bld 144 (*)    Albumin 3.4 (*)    GFR calc non Af Amer 59 (*)    All other components within normal limits  LIPASE, BLOOD - Abnormal; Notable for the following:    Lipase 17  (*)    All other components within normal limits  URINALYSIS, ROUTINE W REFLEX MICROSCOPIC (NOT AT [facility]) - Abnormal; Notable for the following:    APPearance CLOUDY (*)    Nitrite POSITIVE (*)    Leukocytes, UA TRACE (*)    All other components within normal limits  URINE MICROSCOPIC-ADD ON - Abnormal; Notable for the following:    Bacteria, UA MANY (*)    All other components within normal limits  URINE CULTURE  BRAIN NATRIURETIC PEPTIDE  I-STAT TROPOININ, ED    Imaging Review Dg Chest 2 View  01/15/2015   CLINICAL DATA:  Acute onset of weakness and nausea. Decreased O2 saturation. Initial encounter.  EXAM: CHEST  2 VIEW  COMPARISON:  Chest radiograph performed 12/17/2014, and CT of the chest performed 11/27/2014  FINDINGS: The lungs are well-aerated. Vascular congestion is noted, with mildly increased interstitial markings, possibly reflecting minimal interstitial edema. There is no evidence of pleural effusion or pneumothorax.  The heart is borderline normal in size. No acute osseous abnormalities are seen.  IMPRESSION: Vascular congestion, with mildly increased interstitial markings, possibly reflecting minimal interstitial edema.   Electronically Signed   By: Roanna Raider M.D.   On: 01/15/2015 03:50   Ct Head Wo Contrast  01/15/2015   CLINICAL DATA:  Initial evaluation for one-day history of acute headache. History of prior stroke.  EXAM: CT HEAD WITHOUT CONTRAST  TECHNIQUE: Contiguous axial images were obtained from the base of the skull through the vertex without intravenous contrast.  COMPARISON:  Prior study from 12/18/2014.  FINDINGS: Generalized cerebral atrophy with chronic microvascular ischemic disease is present. Recent identified patchy right MCA territory infarcts grossly similar. Prominent vascular calcifications present within the carotid siphons and distal vertebral arteries.  No acute large vessel territory infarct. No intracranial hemorrhage. No mass lesion, midline shift,  or mass effect. No hydrocephalus. No extra-axial fluid collection.  Scalp soft tissues within normal limits. No acute abnormality about the orbits.  Calvarium intact.  Minimal layering opacity within the left sphenoid sinus. Paranasal sinuses are otherwise clear. No mastoid effusion.  IMPRESSION: 1. No acute intracranial process. 2. Generalized cerebral atrophy with chronic microvascular ischemic disease.  Electronically Signed   By: Rise Mu M.D.   On: 01/15/2015 04:03   Ct Abdomen Pelvis W Contrast  01/15/2015   CLINICAL DATA:  Acute onset of epigastric abdominal pain and vomiting. Initial encounter.  EXAM: CT ABDOMEN AND PELVIS WITH CONTRAST  TECHNIQUE: Multidetector CT imaging of the abdomen and pelvis was performed using the standard protocol following bolus administration of intravenous contrast.  CONTRAST:  OMNIPAQUE IOHEXOL 300 MG/ML  SOLN  COMPARISON:  CT of the abdomen and pelvis from 11/27/2014  FINDINGS: Minimal bibasilar atelectasis is noted.  The liver and spleen are unremarkable in appearance. The patient is status post cholecystectomy. The common bile duct is dilated to 1.9 cm, somewhat prominent status post cholecystectomy, with mild prominence of the intrahepatic biliary ducts. This may remain within normal limits, and is stable from the prior study, though would correlate for symptoms of post-cholecystectomy syndrome.  The pancreas and adrenal glands are unremarkable.  Mild scarring is noted near the lower pole of the right kidney, with a 2.4 cm cyst at the lower pole of the right kidney. There is no evidence of hydronephrosis. No renal or ureteral stones are seen. No perinephric stranding is appreciated.  No free fluid is identified. The small bowel is unremarkable in appearance. The stomach is within normal limits. No acute vascular abnormalities are seen. Scattered calcification is noted along the abdominal aorta and its branches.  The appendix is not definitely seen;  there is no evidence appendicitis. A single diverticulum is noted along the mid sigmoid colon. The colon is unremarkable in appearance.  The bladder is mildly distended and grossly unremarkable. Slight irregularity involving the anterior aspect of the bladder wall is thought to be chronic in nature. The patient is status post hysterectomy. No suspicious adnexal masses are seen. No inguinal lymphadenopathy is seen.  No acute osseous abnormalities are identified. The patient is status post anterior lumbar spinal fusion at L4-L5. Underlying chronic fusion of the facets is seen.  IMPRESSION: 1. No acute abnormality seen to explain the patient's symptoms. 2. Status post cholecystectomy. Common bile duct is dilated to 1.9 cm, with mild prominence of the intrahepatic biliary ducts. 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 3. Mild scarring near the lower pole of the right kidney, with a small right renal cyst. 4. Scattered calcification along the abdominal aorta and its branches.   Electronically Signed   By: Roanna Raider M.D.   On: 01/15/2015 03:55     EKG Interpretation   Date/Time:  Sunday January 15 2015 06:10:36 EDT Ventricular Rate:  69 PR Interval:  53 QRS Duration: 154 QT Interval:  441 QTC Calculation: 472 R Axis:   -44 Text Interpretation:  Sinus rhythm Short PR interval Right bundle branch  block No significant change since last tracing Confirmed by WARD,  DO,  KRISTEN ([id number]) on 01/15/2015 6:15:22 AM      MDM   Final diagnoses:  Low oxygen saturation  UTI (lower urinary tract infection)  CHF exacerbation    2:05 AM Labs show elevated WBC at 12.6. Imaging pending.   5:41 AM Imaging shows no acute changes. Patient has a UTI and currently receiving Cipro. Vitals stable and patient afebrile. Patient became hypoxic here and was placed on 2L oxygen. Patient will receive  antibiotics and then be ambulated with pulse ox. If patient is able to maintain oxygen saturation, she can be discharged  with UTI treatment. If hypoxic, patient will be admitted.   Patient signed out to Rhea Bleacher, PA-C.   Emilia Beck, PA-C 01/17/15 1044

## 2015-01-15 NOTE — ED Notes (Signed)
Wet cloth placed over patients eyes for headache.

## 2015-01-15 NOTE — ED Notes (Signed)
emt reports patient is currently placed on 2L for low reading at 86% on room air.

## 2015-01-17 LAB — URINE CULTURE: Special Requests: NORMAL

## 2015-01-18 ENCOUNTER — Telehealth: Payer: Self-pay | Admitting: Emergency Medicine

## 2015-01-18 NOTE — Telephone Encounter (Signed)
Post ED Visit - Positive Culture Follow-up: Chart Hand-off to ED Flow Manager  Culture assessed and recommendations reviewed by: []  Isaac Bliss, Pharm.D., BCPS []  Celedonio Miyamoto, Pharm.D., BCPS-AQ ID []  Georgina Pillion, Pharm.D., BCPS []  [place], [address].D., BCPS, AAHIVP []  Estella Husk, Pharm .D., BCPS, AAHIVP [x]  Isaac Bliss, Pharm.D.  Positive Urine culture  []  Patient discharged without antimicrobial prescription and treatment is now indicated [x]  Organism is resistant to prescribed ED discharge antimicrobial []  Patient with positive blood cultures  Changes discussed with ED provider: Ivar Drape, PA  New antibiotic prescription: Macrobid 100 mg PO BID x seven days, stop Cipro Called to: [facility] [phone]  Patient contacted/daughter:  01/18/15 @ 1726  Jiles Harold 01/18/2015, 5:33 PM

## 2015-01-18 NOTE — Telephone Encounter (Signed)
Post ED Visit - Positive Culture Follow-up: Chart Hand-off to ED Flow Manager  Culture assessed and recommendations reviewed by: []  Isaac Bliss, Pharm.D., BCPS []  Celedonio Miyamoto, [address].D., BCPS-AQ ID []  Georgina Pillion, Pharm.D., BCPS []  [place], [address].D., BCPS, AAHIVP []  Estella Husk, Pharm .D., BCPS, AAHIVP [x]  Isaac Bliss, Pharm.D.  Positive urine culture  []  Patient discharged without antimicrobial prescription and treatment is now indicated [x]  Organism is resistant to prescribed ED discharge antimicrobial []  Patient with positive blood cultures  Changes discussed with ED provider: Ivar Drape, PA New antibiotic prescription" Macrobid 100 mg PO BID x seven days, stop Cipro  Will contact patient   Jiles Harold 01/18/2015, 4:59 PM

## 2015-01-18 NOTE — Progress Notes (Signed)
ED Antimicrobial Stewardship Positive Culture Follow Up   [name] [name] is an 74 y.o. female who presented to [facility] on 01/14/2015 with a chief complaint of  Chief Complaint  Patient presents with  . Abdominal Pain  . Nausea    Recent Results (from the past 720 hour(s))  Urine culture     Status: None   Collection Time: 01/15/15  1:49 AM  Result Value Ref Range Status   Specimen Description URINE, CLEAN CATCH  Final   Special Requests Normal  Final   Culture   Final    >=100,000 COLONIES/mL ESCHERICHIA COLI Confirmed Extended Spectrum Beta-Lactamase Producer (ESBL)    Report Status 01/17/2015 FINAL  Final   Organism ID, Bacteria ESCHERICHIA COLI  Final      Susceptibility   Escherichia coli - MIC*    AMPICILLIN >=32 RESISTANT Resistant     CEFAZOLIN >=64 RESISTANT Resistant     CEFTRIAXONE >=64 RESISTANT Resistant     CIPROFLOXACIN >=4 RESISTANT Resistant     GENTAMICIN <=1 SENSITIVE Sensitive     IMIPENEM <=0.25 SENSITIVE Sensitive     NITROFURANTOIN <=16 SENSITIVE Sensitive     TRIMETH/SULFA >=320 RESISTANT Resistant     AMPICILLIN/SULBACTAM >=32 RESISTANT Resistant     PIP/TAZO 8 SENSITIVE Sensitive     * >=100,000 COLONIES/mL ESCHERICHIA COLI    [x]  Treated with Cipro, organism resistant to prescribed antimicrobial  New antibiotic prescription: Macrobid 100 mg PO BID x 7 days. Stop Cipro.  ED Provider: Roxy Horseman, PA   [name] 01/18/2015, 8:54 AM Infectious Diseases Pharmacist Phone# [phone]

## 2015-01-20 ENCOUNTER — Inpatient Hospital Stay (HOSPITAL_COMMUNITY)
Admission: EM | Admit: 2015-01-20 | Discharge: 2015-01-23 | DRG: 690 | Disposition: A | Discharge status: Home Health | Payer: Medicare Other | Attending: Internal Medicine | Admitting: Internal Medicine

## 2015-01-20 ENCOUNTER — Encounter (HOSPITAL_COMMUNITY): Payer: Self-pay | Admitting: Emergency Medicine

## 2015-01-20 DIAGNOSIS — R32 Unspecified urinary incontinence: Secondary | ICD-10-CM | POA: Diagnosis present

## 2015-01-20 DIAGNOSIS — Z8619 Personal history of other infectious and parasitic diseases: Secondary | ICD-10-CM | POA: Diagnosis not present

## 2015-01-20 DIAGNOSIS — G35 Multiple sclerosis: Secondary | ICD-10-CM | POA: Diagnosis present

## 2015-01-20 DIAGNOSIS — M1990 Unspecified osteoarthritis, unspecified site: Secondary | ICD-10-CM | POA: Diagnosis present

## 2015-01-20 DIAGNOSIS — E039 Hypothyroidism, unspecified: Secondary | ICD-10-CM | POA: Diagnosis present

## 2015-01-20 DIAGNOSIS — K589 Irritable bowel syndrome without diarrhea: Secondary | ICD-10-CM | POA: Diagnosis present

## 2015-01-20 DIAGNOSIS — Z1612 Extended spectrum beta lactamase (ESBL) resistance: Secondary | ICD-10-CM | POA: Diagnosis present

## 2015-01-20 DIAGNOSIS — Z79899 Other long term (current) drug therapy: Secondary | ICD-10-CM

## 2015-01-20 DIAGNOSIS — F17210 Nicotine dependence, cigarettes, uncomplicated: Secondary | ICD-10-CM | POA: Diagnosis present

## 2015-01-20 DIAGNOSIS — Z6839 Body mass index (BMI) 39.0-39.9, adult: Secondary | ICD-10-CM | POA: Diagnosis not present

## 2015-01-20 DIAGNOSIS — I712 Thoracic aortic aneurysm, without rupture: Secondary | ICD-10-CM | POA: Diagnosis present

## 2015-01-20 DIAGNOSIS — N10 Acute tubulo-interstitial nephritis: Secondary | ICD-10-CM | POA: Diagnosis present

## 2015-01-20 DIAGNOSIS — Z7902 Long term (current) use of antithrombotics/antiplatelets: Secondary | ICD-10-CM

## 2015-01-20 DIAGNOSIS — Z809 Family history of malignant neoplasm, unspecified: Secondary | ICD-10-CM | POA: Diagnosis not present

## 2015-01-20 DIAGNOSIS — Z7982 Long term (current) use of aspirin: Secondary | ICD-10-CM

## 2015-01-20 DIAGNOSIS — E669 Obesity, unspecified: Secondary | ICD-10-CM | POA: Diagnosis present

## 2015-01-20 DIAGNOSIS — G8929 Other chronic pain: Secondary | ICD-10-CM | POA: Diagnosis present

## 2015-01-20 DIAGNOSIS — Z9049 Acquired absence of other specified parts of digestive tract: Secondary | ICD-10-CM | POA: Diagnosis present

## 2015-01-20 DIAGNOSIS — N319 Neuromuscular dysfunction of bladder, unspecified: Secondary | ICD-10-CM | POA: Diagnosis present

## 2015-01-20 DIAGNOSIS — F329 Major depressive disorder, single episode, unspecified: Secondary | ICD-10-CM | POA: Diagnosis present

## 2015-01-20 DIAGNOSIS — M545 Low back pain: Secondary | ICD-10-CM | POA: Diagnosis present

## 2015-01-20 DIAGNOSIS — Z90710 Acquired absence of both cervix and uterus: Secondary | ICD-10-CM | POA: Diagnosis not present

## 2015-01-20 DIAGNOSIS — B958 Unspecified staphylococcus as the cause of diseases classified elsewhere: Secondary | ICD-10-CM | POA: Diagnosis present

## 2015-01-20 DIAGNOSIS — F419 Anxiety disorder, unspecified: Secondary | ICD-10-CM | POA: Diagnosis present

## 2015-01-20 DIAGNOSIS — I639 Cerebral infarction, unspecified: Secondary | ICD-10-CM | POA: Diagnosis present

## 2015-01-20 DIAGNOSIS — Z8673 Personal history of transient ischemic attack (TIA), and cerebral infarction without residual deficits: Secondary | ICD-10-CM

## 2015-01-20 DIAGNOSIS — Z96652 Presence of left artificial knee joint: Secondary | ICD-10-CM | POA: Diagnosis present

## 2015-01-20 DIAGNOSIS — Z79891 Long term (current) use of opiate analgesic: Secondary | ICD-10-CM

## 2015-01-20 DIAGNOSIS — Z885 Allergy status to narcotic agent status: Secondary | ICD-10-CM

## 2015-01-20 DIAGNOSIS — G4733 Obstructive sleep apnea (adult) (pediatric): Secondary | ICD-10-CM | POA: Diagnosis present

## 2015-01-20 DIAGNOSIS — Z888 Allergy status to other drugs, medicaments and biological substances status: Secondary | ICD-10-CM | POA: Diagnosis not present

## 2015-01-20 DIAGNOSIS — E785 Hyperlipidemia, unspecified: Secondary | ICD-10-CM | POA: Diagnosis present

## 2015-01-20 DIAGNOSIS — A499 Bacterial infection, unspecified: Secondary | ICD-10-CM | POA: Diagnosis not present

## 2015-01-20 DIAGNOSIS — N390 Urinary tract infection, site not specified: Secondary | ICD-10-CM | POA: Diagnosis present

## 2015-01-20 DIAGNOSIS — R112 Nausea with vomiting, unspecified: Secondary | ICD-10-CM | POA: Diagnosis present

## 2015-01-20 DIAGNOSIS — Z880 Allergy status to penicillin: Secondary | ICD-10-CM

## 2015-01-20 DIAGNOSIS — B9620 Unspecified Escherichia coli [E. coli] as the cause of diseases classified elsewhere: Secondary | ICD-10-CM | POA: Diagnosis present

## 2015-01-20 DIAGNOSIS — F319 Bipolar disorder, unspecified: Secondary | ICD-10-CM | POA: Diagnosis present

## 2015-01-20 DIAGNOSIS — E119 Type 2 diabetes mellitus without complications: Secondary | ICD-10-CM | POA: Diagnosis present

## 2015-01-20 DIAGNOSIS — Z825 Family history of asthma and other chronic lower respiratory diseases: Secondary | ICD-10-CM

## 2015-01-20 DIAGNOSIS — I10 Essential (primary) hypertension: Secondary | ICD-10-CM | POA: Diagnosis present

## 2015-01-20 DIAGNOSIS — F17200 Nicotine dependence, unspecified, uncomplicated: Secondary | ICD-10-CM | POA: Diagnosis present

## 2015-01-20 DIAGNOSIS — F32A Depression, unspecified: Secondary | ICD-10-CM | POA: Diagnosis present

## 2015-01-20 LAB — COMPREHENSIVE METABOLIC PANEL
ALT: 31 U/L (ref 14–54)
ANION GAP: 10 (ref 5–15)
AST: 25 U/L (ref 15–41)
Albumin: 3.9 g/dL (ref 3.5–5.0)
Alkaline Phosphatase: 65 U/L (ref 38–126)
BILIRUBIN TOTAL: 0.8 mg/dL (ref 0.3–1.2)
BUN: 17 mg/dL (ref 6–20)
CO2: 27 mmol/L (ref 22–32)
Calcium: 9.5 mg/dL (ref 8.9–10.3)
Chloride: 102 mmol/L (ref 101–111)
Creatinine, Ser: 0.85 mg/dL (ref 0.44–1.00)
GFR calc Af Amer: >60 mL/min (ref >60)
GFR calc non Af Amer: >60 mL/min (ref >60)
GLUCOSE: 125 mg/dL — AB (ref 65–99)
Potassium: 3.6 mmol/L (ref 3.5–5.1)
SODIUM: 139 mmol/L (ref 135–145)
Total Protein: 7.6 g/dL (ref 6.5–8.1)

## 2015-01-20 LAB — CBC WITH DIFFERENTIAL/PLATELET
Basophils Absolute: 0 10*3/uL (ref 0.0–0.1)
Basophils Relative: 0 % (ref 0–1)
Eosinophils Absolute: 0.1 10*3/uL (ref 0.0–0.7)
Eosinophils Relative: 0 % (ref 0–5)
HCT: 44.3 % (ref 36.0–46.0)
Hemoglobin: 14.6 g/dL (ref 12.0–15.0)
Lymphocytes Relative: 15 % (ref 12–46)
Lymphs Abs: 1.8 10*3/uL (ref 0.7–4.0)
MCH: 29.9 pg (ref 26.0–34.0)
MCHC: 33 g/dL (ref 30.0–36.0)
MCV: 90.8 fL (ref 78.0–100.0)
Monocytes Absolute: 0.8 10*3/uL (ref 0.1–1.0)
Monocytes Relative: 7 % (ref 3–12)
Neutro Abs: 9.4 10*3/uL — ABNORMAL HIGH (ref 1.7–7.7)
Neutrophils Relative %: 78 % — ABNORMAL HIGH (ref 43–77)
Platelets: 361 10*3/uL (ref 150–400)
RBC: 4.88 MIL/uL (ref 3.87–5.11)
RDW: 13.7 % (ref 11.5–15.5)
WBC: 12 10*3/uL — ABNORMAL HIGH (ref 4.0–10.5)

## 2015-01-20 LAB — URINALYSIS, ROUTINE W REFLEX MICROSCOPIC
Bilirubin Urine: NEGATIVE
Glucose, UA: NEGATIVE mg/dL
HGB URINE DIPSTICK: NEGATIVE
Ketones, ur: NEGATIVE mg/dL
Leukocytes, UA: NEGATIVE
NITRITE: NEGATIVE
PROTEIN: NEGATIVE mg/dL
Specific Gravity, Urine: 1.021 (ref 1.005–1.030)
Urobilinogen, UA: 0.2 mg/dL (ref 0.0–1.0)
pH: 5.5 (ref 5.0–8.0)

## 2015-01-20 LAB — GLUCOSE, CAPILLARY
GLUCOSE-CAPILLARY: 91 mg/dL (ref 65–99)
GLUCOSE-CAPILLARY: 89 mg/dL (ref 65–99)

## 2015-01-20 LAB — LIPASE, BLOOD: LIPASE: 15 U/L — AB (ref 22–51)

## 2015-01-20 MED ORDER — ACETAMINOPHEN 650 MG RE SUPP: 650.0000 mg | Freq: Four times a day (QID) | RECTAL | Status: DC | PRN

## 2015-01-20 MED ORDER — FENTANYL CITRATE (PF) 100 MCG/2ML IJ SOLN
50.0000 ug | Freq: Once | INTRAMUSCULAR | Status: AC
  Administered 2015-01-20: 50 ug via INTRAVENOUS
  Filled 2015-01-20: qty 2

## 2015-01-20 MED ORDER — ACETAMINOPHEN 325 MG PO TABS: 650.0000 mg | ORAL_TABLET | Freq: Four times a day (QID) | ORAL | Status: DC | PRN

## 2015-01-20 MED ORDER — ONDANSETRON HCL 4 MG PO TABS: 4.0000 mg | ORAL_TABLET | Freq: Four times a day (QID) | ORAL | Status: DC | PRN

## 2015-01-20 MED ORDER — LOSARTAN POTASSIUM 50 MG PO TABS
100.0000 mg | ORAL_TABLET | Freq: Every day | ORAL | Status: DC
  Administered 2015-01-20 – 2015-01-23 (×4): 100 mg via ORAL
  Filled 2015-01-20 (×4): qty 2

## 2015-01-20 MED ORDER — BUPROPION HCL ER (XL) 300 MG PO TB24
300.0000 mg | ORAL_TABLET | Freq: Every day | ORAL | Status: DC
  Administered 2015-01-20 – 2015-01-23 (×4): 300 mg via ORAL
  Filled 2015-01-20 (×4): qty 1

## 2015-01-20 MED ORDER — GABAPENTIN 100 MG PO CAPS
100.0000 mg | ORAL_CAPSULE | Freq: Three times a day (TID) | ORAL | Status: DC
  Administered 2015-01-20 – 2015-01-23 (×9): 100 mg via ORAL
  Filled 2015-01-20 (×12): qty 1

## 2015-01-20 MED ORDER — CLOPIDOGREL BISULFATE 75 MG PO TABS
75.0000 mg | ORAL_TABLET | Freq: Every day | ORAL | Status: DC
  Administered 2015-01-20 – 2015-01-23 (×4): 75 mg via ORAL
  Filled 2015-01-20 (×4): qty 1

## 2015-01-20 MED ORDER — NICOTINE 7 MG/24HR TD PT24
7.0000 mg | MEDICATED_PATCH | Freq: Every day | TRANSDERMAL | Status: DC
  Filled 2015-01-20: qty 1

## 2015-01-20 MED ORDER — UMECLIDINIUM-VILANTEROL 62.5-25 MCG/INH IN AEPB
1.0000 | INHALATION_SPRAY | Freq: Every day | RESPIRATORY_TRACT | Status: DC
  Administered 2015-01-21 – 2015-01-23 (×3): 1 via RESPIRATORY_TRACT

## 2015-01-20 MED ORDER — CLONAZEPAM 1 MG PO TABS
1.0000 mg | ORAL_TABLET | Freq: Every day | ORAL | Status: DC
  Administered 2015-01-20 – 2015-01-22 (×3): 1 mg via ORAL
  Filled 2015-01-20 (×3): qty 1

## 2015-01-20 MED ORDER — PANTOPRAZOLE SODIUM 40 MG PO TBEC
40.0000 mg | DELAYED_RELEASE_TABLET | Freq: Two times a day (BID) | ORAL | Status: DC
  Administered 2015-01-20 – 2015-01-23 (×6): 40 mg via ORAL
  Filled 2015-01-20 (×8): qty 1

## 2015-01-20 MED ORDER — ATORVASTATIN CALCIUM 20 MG PO TABS
20.0000 mg | ORAL_TABLET | Freq: Every day | ORAL | Status: DC
  Administered 2015-01-20 – 2015-01-23 (×4): 20 mg via ORAL
  Filled 2015-01-20 (×4): qty 1

## 2015-01-20 MED ORDER — FLUOXETINE HCL 20 MG PO CAPS
40.0000 mg | ORAL_CAPSULE | Freq: Every day | ORAL | Status: DC
  Administered 2015-01-20 – 2015-01-23 (×4): 40 mg via ORAL
  Filled 2015-01-20 (×4): qty 2

## 2015-01-20 MED ORDER — INSULIN ASPART 100 UNIT/ML ~~LOC~~ SOLN
0.0000 [IU] | Freq: Three times a day (TID) | SUBCUTANEOUS | Status: DC
  Administered 2015-01-22: 2 [IU] via SUBCUTANEOUS

## 2015-01-20 MED ORDER — INSULIN ASPART 100 UNIT/ML ~~LOC~~ SOLN: 0.0000 [IU] | Freq: Every day | SUBCUTANEOUS | Status: DC

## 2015-01-20 MED ORDER — ONDANSETRON HCL 4 MG/2ML IJ SOLN
4.0000 mg | Freq: Once | INTRAMUSCULAR | Status: AC
  Administered 2015-01-20: 4 mg via INTRAVENOUS
  Filled 2015-01-20: qty 2

## 2015-01-20 MED ORDER — SODIUM CHLORIDE 0.9 % IV BOLUS (SEPSIS)
1000.0000 mL | Freq: Once | INTRAVENOUS | Status: AC
  Administered 2015-01-20: 1000 mL via INTRAVENOUS

## 2015-01-20 MED ORDER — ASPIRIN EC 81 MG PO TBEC
81.0000 mg | DELAYED_RELEASE_TABLET | Freq: Every day | ORAL | Status: DC
  Administered 2015-01-20 – 2015-01-23 (×4): 81 mg via ORAL
  Filled 2015-01-20 (×4): qty 1

## 2015-01-20 MED ORDER — OXYCODONE HCL 5 MG PO TABS: 2.5000 mg | ORAL_TABLET | ORAL | Status: DC | PRN

## 2015-01-20 MED ORDER — SODIUM CHLORIDE 0.9 % IV SOLN
INTRAVENOUS | Status: DC
  Administered 2015-01-20 – 2015-01-23 (×4): via INTRAVENOUS

## 2015-01-20 MED ORDER — ENOXAPARIN SODIUM 60 MG/0.6ML ~~LOC~~ SOLN
50.0000 mg | SUBCUTANEOUS | Status: DC
  Administered 2015-01-20 – 2015-01-22 (×3): 50 mg via SUBCUTANEOUS
  Filled 2015-01-20 (×4): qty 0.6

## 2015-01-20 MED ORDER — SODIUM CHLORIDE 0.9 % IV SOLN
500.0000 mg | Freq: Three times a day (TID) | INTRAVENOUS | Status: DC
  Administered 2015-01-20 – 2015-01-23 (×8): 500 mg via INTRAVENOUS
  Filled 2015-01-20 (×9): qty 500

## 2015-01-20 MED ORDER — AMLODIPINE BESYLATE 5 MG PO TABS
5.0000 mg | ORAL_TABLET | Freq: Every day | ORAL | Status: DC
  Administered 2015-01-20 – 2015-01-23 (×4): 5 mg via ORAL
  Filled 2015-01-20 (×4): qty 1

## 2015-01-20 MED ORDER — BACLOFEN 10 MG PO TABS
10.0000 mg | ORAL_TABLET | Freq: Two times a day (BID) | ORAL | Status: DC | PRN
  Filled 2015-01-20: qty 1

## 2015-01-20 MED ORDER — ONDANSETRON HCL 4 MG/2ML IJ SOLN
4.0000 mg | Freq: Four times a day (QID) | INTRAMUSCULAR | Status: DC | PRN
  Administered 2015-01-20 – 2015-01-22 (×3): 4 mg via INTRAVENOUS
  Filled 2015-01-20 (×4): qty 2

## 2015-01-20 MED ORDER — VITAMIN B 12 250 MCG PO LOZG: 20000.0000 ug | LOZENGE | ORAL | Status: DC

## 2015-01-20 MED ORDER — DIPHENOXYLATE-ATROPINE 2.5-0.025 MG PO TABS
2.0000 | ORAL_TABLET | Freq: Four times a day (QID) | ORAL | Status: DC | PRN
  Administered 2015-01-21 – 2015-01-22 (×3): 2 via ORAL
  Filled 2015-01-20 (×3): qty 2

## 2015-01-20 MED ORDER — VITAMIN D 1000 UNITS PO TABS
4000.0000 [IU] | ORAL_TABLET | Freq: Two times a day (BID) | ORAL | Status: DC
  Administered 2015-01-21 – 2015-01-23 (×5): 4000 [IU] via ORAL
  Filled 2015-01-20 (×7): qty 4

## 2015-01-20 MED ORDER — GENTAMICIN SULFATE 40 MG/ML IJ SOLN
160.0000 mg | Freq: Once | INTRAVENOUS | Status: AC
  Administered 2015-01-20: 160 mg via INTRAVENOUS
  Filled 2015-01-20: qty 4

## 2015-01-20 MED ORDER — LEVOTHYROXINE SODIUM 200 MCG PO TABS
200.0000 ug | ORAL_TABLET | Freq: Every day | ORAL | Status: DC
  Administered 2015-01-21 – 2015-01-23 (×3): 200 ug via ORAL
  Filled 2015-01-20 (×4): qty 1

## 2015-01-20 NOTE — Progress Notes (Addendum)
ANTIBIOTIC CONSULT NOTE - INITIAL  Pharmacy Consult for Primaxin Indication: ESBL UTI  Allergies  Allergen Reactions  . Codeine Other (See Comments)    Real bad chest pains  . Lisinopril Cough  . Demerol Nausea Only  . Penicillins Rash and Other (See Comments)    Pt not sure "chestpains"    Patient Measurements:     Vital Signs: Temp: 98.5 F (36.9 C) (07/29 1304) Temp Source: Oral (07/29 1304) BP: 134/83 mmHg (07/29 1319) Pulse Rate: 86 (07/29 1319) Intake/Output from previous day:   Intake/Output from this shift:    Labs:  Recent Labs  01/20/15 1051  WBC 12.0*  HGB 14.6  PLT 361  CREATININE 0.85   CrCl cannot be calculated (Unknown ideal weight.). No results for input(s): VANCOTROUGH, VANCOPEAK, VANCORANDOM, GENTTROUGH, GENTPEAK, GENTRANDOM, TOBRATROUGH, TOBRAPEAK, TOBRARND, AMIKACINPEAK, AMIKACINTROU, AMIKACIN in the last 72 hours.   Microbiology: Recent Results (from the past 720 hour(s))  Urine culture     Status: None   Collection Time: 01/15/15  1:49 AM  Result Value Ref Range Status   Specimen Description URINE, CLEAN CATCH  Final   Special Requests Normal  Final   Culture   Final    >=100,000 COLONIES/mL ESCHERICHIA COLI Confirmed Extended Spectrum Beta-Lactamase Producer (ESBL)    Report Status 01/17/2015 FINAL  Final   Organism ID, Bacteria ESCHERICHIA COLI  Final      Susceptibility   Escherichia coli - MIC*    AMPICILLIN >=32 RESISTANT Resistant     CEFAZOLIN >=64 RESISTANT Resistant     CEFTRIAXONE >=64 RESISTANT Resistant     CIPROFLOXACIN >=4 RESISTANT Resistant     GENTAMICIN <=1 SENSITIVE Sensitive     IMIPENEM <=0.25 SENSITIVE Sensitive     NITROFURANTOIN <=16 SENSITIVE Sensitive     TRIMETH/SULFA >=320 RESISTANT Resistant     AMPICILLIN/SULBACTAM >=32 RESISTANT Resistant     PIP/TAZO 8 SENSITIVE Sensitive     * >=100,000 COLONIES/mL ESCHERICHIA COLI    Medical History: Past Medical History  Diagnosis Date  . HTN  (hypertension)   . Ascending aortic aneurysm   . Dyslipidemia   . Tobacco dependence   . Hypothyroidism   . Depression   . Neurogenic bladder   . Obesity   . Dog bite(E906.0)     RLL with systemic inflammatory response  . Chronic low back pain   . Anxiety   . MS (multiple sclerosis)     Followed by Dr. Sandria Manly  . Arthritis   . History of ulcer disease 30 yrs ago  . IBS (irritable bowel syndrome)   . Incontinence in female   . Obstructive sleep apnea     a. compliant with CPAP  . Diabetes mellitus without complication      Assessment: 46 y/oF with PMH of AAA, recent CVA, MS recently seen in ED on 7/24 and discharged with prescription for Cipro for UTI, changed to Macrobid on 7/27 per urine culture results of ESBL E.coli who presents to [facility] ED with persistent n/v and abdominal pain. Patient received one dose of Gentamicin in the ED. Pharmacy now consulted to dose Primaxin for acute pyelonephritis with ESBL UTI. [facility] MD aware of allergy to Penicillin and will monitor patient closely for reaction.  PTA Cipro 7/24-7/27 PTA Macrobid 7/27-7/28 (prescribed 7-day course)  7/29 >> Gentamicin x 1 7/29 >> Primaxin >>    7/24 urine: ESBL E.coli-S to Gentamicin, Imipenem, Nitrofurantoin, Zosyn 7/29 blood x 2: sent 7/29 urine: sent   Tmax: 98.56F WBC: elevated  at 12K SCr 0.85  Goal of Therapy:  Appropriate antibiotic dosing for renal function and indication Eradication of infection  Plan:   Primaxin 500mg  IV q8h.  Monitor renal function, cultures, clinical course.  Greer Pickerel, PharmD, BCPS Pager: [phone] 01/20/2015 2:59 PM

## 2015-01-20 NOTE — ED Provider Notes (Signed)
CSN: [id number]     Arrival date & time 01/20/15  7425 History   First MD Initiated Contact with Patient 01/20/15 [phone]     Chief Complaint  Patient presents with  . Emesis     (Consider location/radiation/quality/duration/timing/severity/associated sxs/prior Treatment) Patient is a 74 y.o. female presenting with vomiting.  Emesis Severity:  Severe Duration:  1 week Timing:  Constant Number of daily episodes:  5 Quality:  Stomach contents Feeding tolerance: nothing. Progression:  Worsening Chronicity:  New Recent urination:  Normal Relieved by:  Nothing Worsened by:  Nothing tried Ineffective treatments:  Antiemetics Associated symptoms: abdominal pain (middle and lower abodmen) and diarrhea (this AM)   Associated symptoms: no fever, no headaches and no sore throat   Associated symptoms comment:  No flatus  Risk factors: diabetes and prior abdominal surgery (hysterectomy, cholecystectomy)     Past Medical History  Diagnosis Date  . HTN (hypertension)   . Ascending aortic aneurysm   . Dyslipidemia   . Tobacco dependence   . Hypothyroidism   . Depression   . Neurogenic bladder   . Obesity   . Dog bite(E906.0)     RLL with systemic inflammatory response  . Chronic low back pain   . Anxiety   . MS (multiple sclerosis)     Followed by Dr. Sandria Manly  . Arthritis   . History of ulcer disease 30 yrs ago  . IBS (irritable bowel syndrome)   . Incontinence in female   . Obstructive sleep apnea     a. compliant with CPAP  . Diabetes mellitus without complication    Past Surgical History  Procedure Laterality Date  . Cholecystectomy    . Hysterectomy    . Nasal sinus surgery    . Left eye surgery    . Bilateral foot surgery    . Leg surgery  2006    due to fx leg   . Back surgery    . Total knee arthroplasty Left 04/11/2014    Procedure: LEFT TOTAL KNEE ARTHROPLASTY;  Surgeon: Shelda Pal, MD;  Location: [facility];  Service: Orthopedics;  Laterality: Left;  . Tee  without cardioversion N/A 12/21/2014    Procedure: TRANSESOPHAGEAL ECHOCARDIOGRAM (TEE);  Surgeon: Thurmon Fair, MD;  Location: [facility] ENDOSCOPY;  Service: Cardiovascular;  Laterality: N/A;   Family History  Problem Relation Age of Onset  . Cancer Father   . Emphysema Father    History  Substance Use Topics  . Smoking status: Current Every Day Smoker -- 0.50 packs/day for 30 years    Types: Cigarettes  . Smokeless tobacco: Never Used  . Alcohol Use: Yes     Comment: Rare alcohol use   OB History    No data available     Review of Systems  Constitutional: Negative for fever.  HENT: Negative for sore throat.   Eyes: Negative for visual disturbance.  Respiratory: Negative for cough and shortness of breath.   Cardiovascular: Negative for chest pain.  Gastrointestinal: Positive for vomiting, abdominal pain (middle and lower abodmen) and diarrhea (this AM). Negative for constipation and blood in stool.  Genitourinary: Negative for difficulty urinating.  Musculoskeletal: Positive for back pain (bilateral). Negative for neck pain.  Skin: Negative for rash.  Neurological: Negative for syncope and headaches.      Allergies  Codeine; Lisinopril; Demerol; and Penicillins  Home Medications   Prior to Admission medications   Medication Sig Start Date End Date Taking? Authorizing Provider  acetaminophen (TYLENOL) 325 MG tablet  Take 2 tablets (650 mg total) by mouth every 6 (six) hours as needed for mild pain (or Fever >/= 101). 12/22/14  Yes Osvaldo Shipper, MD  amLODipine (NORVASC) 5 MG tablet Take 5 mg by mouth daily.   Yes Historical Provider, MD  aspirin EC 81 MG EC tablet Take 1 tablet (81 mg total) by mouth daily. For 3 months. And then Plavix alone. 12/22/14  Yes Osvaldo Shipper, MD  atorvastatin (LIPITOR) 20 MG tablet Take 20 mg by mouth daily.   Yes Historical Provider, MD  baclofen (LIORESAL) 10 MG tablet Take 10 mg by mouth 2 (two) times daily as needed for muscle spasms.   Yes  Historical Provider, MD  buPROPion (WELLBUTRIN XL) 300 MG 24 hr tablet Take 300 mg by mouth daily.  01/17/14  Yes Historical Provider, MD  Cholecalciferol (VITAMIN D) 2000 UNITS tablet Take 4,000 Units by mouth 2 (two) times daily.    Yes Historical Provider, MD  clonazePAM (KLONOPIN) 1 MG tablet Take 1 tablet (1 mg total) by mouth at bedtime. 12/23/14  Yes Tiffany L Reed, DO  clopidogrel (PLAVIX) 75 MG tablet Take 1 tablet (75 mg total) by mouth daily. 12/22/14  Yes Osvaldo Shipper, MD  diphenoxylate-atropine (LOMOTIL) 2.5-0.025 MG per tablet Take 2 tablets by mouth 4 (four) times daily as needed for diarrhea or loose stools. 12/23/14  Yes Tiffany L Reed, DO  FLUoxetine (PROZAC) 40 MG capsule Take 40 mg by mouth every morning.    Yes Historical Provider, MD  gabapentin (NEURONTIN) 100 MG capsule Take 1 capsule (100 mg total) by mouth every 8 (eight) hours. 12/22/14  Yes Osvaldo Shipper, MD  levothyroxine (SYNTHROID, LEVOTHROID) 200 MCG tablet Take 200 mcg by mouth daily before breakfast.   Yes Historical Provider, MD  losartan (COZAAR) 100 MG tablet Take 100 mg by mouth every morning.  10/15/11  Yes Historical Provider, MD  metFORMIN (GLUCOPHAGE) 500 MG tablet Take 1 tablet (500 mg total) by mouth 2 (two) times daily with a meal. 12/22/14  Yes Osvaldo Shipper, MD  nitrofurantoin, macrocrystal-monohydrate, (MACROBID) 100 MG capsule Take 100 mg by mouth 2 (two) times daily. 01/18/15  Yes Historical Provider, MD  ondansetron (ZOFRAN ODT) 4 MG disintegrating tablet Take 1 tablet (4 mg total) by mouth every 8 (eight) hours as needed for nausea or vomiting. 01/15/15  Yes Kristen N Ward, DO  oxyCODONE (OXY IR/ROXICODONE) 5 MG immediate release tablet Take 0.5 tablets (2.5 mg total) by mouth every 4 (four) hours as needed for moderate pain. 12/23/14  Yes Tiffany L Reed, DO  pantoprazole (PROTONIX) 40 MG tablet Take 40 mg by mouth 2 (two) times daily.   Yes Historical Provider, MD  Umeclidinium-Vilanterol (ANORO ELLIPTA)  62.5-25 MCG/INH AEPB Inhale 1 application into the lungs daily.    Yes Historical Provider, MD  celecoxib (CELEBREX) 200 MG capsule Take 1 capsule (200 mg total) by mouth daily. Patient not taking: Reported on 01/20/2015 12/22/14   Osvaldo Shipper, MD  ciprofloxacin (CIPRO) 500 MG tablet Take 1 tablet (500 mg total) by mouth 2 (two) times daily. Patient not taking: Reported on 01/20/2015 01/15/15   Emilia Beck, PA-C  nicotine (NICODERM CQ - DOSED IN MG/24 HR) 7 mg/24hr patch Place 1 patch (7 mg total) onto the skin daily. Patient not taking: Reported on 01/20/2015 12/22/14   Osvaldo Shipper, MD   BP 140/87 mmHg  Pulse 87  Temp(Src) 98.2 F (36.8 C) (Oral)  Resp 20  Ht 5\' 3"  (1.6 m)  Wt 221 lb  9 oz (100.5 kg)  BMI 39.26 kg/m2  SpO2 95% Physical Exam  Constitutional: She is oriented to person, place, and time. She appears well-developed and well-nourished. She appears ill. No distress.  HENT:  Head: Normocephalic and atraumatic.  Eyes: Conjunctivae and EOM are normal.  Neck: Normal range of motion.  Cardiovascular: Normal rate, regular rhythm, normal heart sounds and intact distal pulses.  Exam reveals no gallop and no friction rub.   No murmur heard. Pulmonary/Chest: Effort normal and breath sounds normal. No respiratory distress. She has no wheezes. She has no rales.  Abdominal: Soft. She exhibits no distension. There is tenderness (mild diffuse). There is CVA tenderness. There is no guarding.  Musculoskeletal: She exhibits no edema or tenderness.  Neurological: She is alert and oriented to person, place, and time.  Skin: Skin is warm and dry. No rash noted. She is not diaphoretic. No erythema.  Nursing note and vitals reviewed.   ED Course  Procedures (including critical care time) Labs Review Labs Reviewed  CBC WITH DIFFERENTIAL/PLATELET - Abnormal; Notable for the following:    WBC 12.0 (*)    Neutrophils Relative % 78 (*)    Neutro Abs 9.4 (*)    All other components within  normal limits  COMPREHENSIVE METABOLIC PANEL - Abnormal; Notable for the following:    Glucose, Bld 125 (*)    All other components within normal limits  LIPASE, BLOOD - Abnormal; Notable for the following:    Lipase 15 (*)    All other components within normal limits  URINALYSIS, ROUTINE W REFLEX MICROSCOPIC (NOT AT [facility]) - Abnormal; Notable for the following:    Color, Urine AMBER (*)    APPearance CLOUDY (*)    All other components within normal limits  CULTURE, BLOOD (ROUTINE X 2)  CULTURE, BLOOD (ROUTINE X 2)  URINE CULTURE  GLUCOSE, CAPILLARY    Imaging Review No results found.   EKG Interpretation   Date/Time:  Friday January 20 2015 09:46:08 EDT Ventricular Rate:  82 PR Interval:    QRS Duration: 157 QT Interval:  428 QTC Calculation: 500 R Axis:   -30 Text Interpretation:  Normal sinus rhythm Right bundle branch block  Inferior infarct, old No significant change since last tracing Confirmed  by [facility] MD, Nakenya Theall ([id number]) on 01/20/2015 11:05:29 AM      MDM   Final diagnoses:  Acute pyelonephritis  History of ESBL E. coli infection   73yo female with history of hyperlipidemia, hypothyroidism, hypertension, DM, presents with concern of continuing nausea/vomiting and abdominal pain after being diagnosed with a UTI and having negative CT 7/24.  Given continuation of same pain/nausea and recent negative imaging with benign exam, have low suspicion for diverticulitis, appendicitis, intraabdominal abscess, obstruction.  Pt started on abx at last ED visit however cx showed EColi resistant to several abx and yesterday she was called in macrobid.  Today given concern of continued n/v/abd pain, concern for pyelonephritis with resistant E.Coli.  Given pt allergies and antibiogram, ordered gentamycin to cover pt's UTI and called hospitalist for admission. Ordered EKG secondary to emesis which showed no acute findings. Pt admitted to hospitalist in stable condition and symptoms  improved with zofran and fentanyl.   Alvira Monday, MD 01/20/15 [phone]

## 2015-01-20 NOTE — ED Notes (Signed)
Pt via [facility] c/o N/V x1 week. Pt started on new antibiotic Baclofen and has continued to throw up every day. Today also one episode of diarrhea. Per EMS given 4 zofran with EKG unremarkable. Pain in LRQ soft and tender.

## 2015-01-20 NOTE — Progress Notes (Signed)
Received report from [place], Charity fundraiser. Pt arrived unit, alert, oriented and able to communicate needs. MD aware of Pt's location. Will continue with current plan of care.

## 2015-01-20 NOTE — H&P (Signed)
[facility] History and Physical  [name] [name] ZOX:[id number] DOB: [birth year] DOA: 01/20/2015  Referring physician: Dr Alvira Monday PCP: Ezequiel Kayser, MD   Chief Complaint:   persistent nausea abdominal pain and vomiting.  HPI:  74 year old obese female with a recent hospitalization for stroke, hypertension, hyperlipidemia and newly diagnosed diabetes mellitus, recently discharged home from skilled nursing facility was seen in the ED on 7/24 for nausea and vomiting with right lower abdominal and back pain. CT of the abdomen and pelvis done at that time was unremarkable except for CBG of 1.9 cm (patient is status post cholecystectomy). UA was positive for UTI and patient was discharged on ciprofloxacin. However symptoms did not improve and patient was persistently nauseous with several episodes of vomiting last 2 days, ongoing right lower quadrant pain and lower back pain. She denies any fever or chills. Denies any dysuria or hematuria. She was then switched to nitrofurantoin on 7/27 for a seven-day course (after cultures reviewed as outpatient) however her symptoms did not resolve and she came to the ED.  Course in the ED Patient's vitals were stable. Blood will done showed WBC of 12, normal platelets. Chemistry was unremarkable. Urine culture done on 7/24 growing ESBL sensitive to Zosyn, nitrofurantoin, gentamicin and imipenem only. Patient given a dose of IV gentamicin, while later IV normal saline bolus and Zofran and hospitalist admission requested to medical for.  Patient denies headache, dizziness, fever, chills,  chest pain, palpitations, SOB,  bowel or urinary symptoms. Denies change in weight but has poor appetite.   Review of Systems:  Constitutional: Denies fever, chills, diaphoresis, appetite change and fatigue.  HEENT: Denies visual or hearing symptoms, difficulty swallowing, sore throat, neck pain or stiffness   Respiratory: Denies SOB, DOE, cough, chest tightness,   and wheezing.   Cardiovascular: Denies chest pain, palpitations and leg swelling.  Gastrointestinal:  nausea, vomiting, abdominal pain, denies diarrhea, constipation, blood in stool and abdominal distention.  Genitourinary: Denies dysuria,, hematuria, flank pain and difficulty urinating.  Endocrine: Denies: hot or cold intolerance,  polyuria, polydipsia. Musculoskeletal: Denies myalgias, back pain, joint pain or swelling Skin: Denies pallor, rash and wound.  Neurological: Denies dizziness,syncope, weakness, light-headedness, numbness and headaches.  Hematological: Denies adenopathy.  Psychiatric/Behavioral: Denies confusion    Past Medical History  Diagnosis Date  . HTN (hypertension)   . Ascending aortic aneurysm   . Dyslipidemia   . Tobacco dependence   . Hypothyroidism   . Depression   . Neurogenic bladder   . Obesity   . Dog bite(E906.0)     RLL with systemic inflammatory response  . Chronic low back pain   . Anxiety   . MS (multiple sclerosis)     Followed by Dr. Sandria Manly  . Arthritis   . History of ulcer disease 30 yrs ago  . IBS (irritable bowel syndrome)   . Incontinence in female   . Obstructive sleep apnea     a. compliant with CPAP  . Diabetes mellitus without complication    Past Surgical History  Procedure Laterality Date  . Cholecystectomy    . Hysterectomy    . Nasal sinus surgery    . Left eye surgery    . Bilateral foot surgery    . Leg surgery  2006    due to fx leg   . Back surgery    . Total knee arthroplasty Left 04/11/2014    Procedure: LEFT TOTAL KNEE ARTHROPLASTY;  Surgeon: Shelda Pal, MD;  Location: [facility]S;  Service:  Orthopedics;  Laterality: Left;  . Tee without cardioversion N/A 12/21/2014    Procedure: TRANSESOPHAGEAL ECHOCARDIOGRAM (TEE);  Surgeon: Thurmon Fair, MD;  Location: [facility] ENDOSCOPY;  Service: Cardiovascular;  Laterality: N/A;   Social History:  reports that she has been smoking Cigarettes.  She has a 15 pack-year smoking  history. She has never used smokeless tobacco. She reports that she drinks alcohol. She reports that she does not use illicit drugs.  Allergies  Allergen Reactions  . Codeine Other (See Comments)    Real bad chest pains  . Lisinopril Cough  . Demerol Nausea Only  . Penicillins Rash and Other (See Comments)    Pt not sure "chestpains"    Family History  Problem Relation Age of Onset  . Cancer Father   . Emphysema Father     Prior to Admission medications   Medication Sig Start Date End Date Taking? Authorizing Provider  acetaminophen (TYLENOL) 325 MG tablet Take 2 tablets (650 mg total) by mouth every 6 (six) hours as needed for mild pain (or Fever >/= 101). 12/22/14  Yes Osvaldo Shipper, MD  amLODipine (NORVASC) 5 MG tablet Take 5 mg by mouth daily.   Yes Historical Provider, MD  aspirin EC 81 MG EC tablet Take 1 tablet (81 mg total) by mouth daily. For 3 months. And then Plavix alone. 12/22/14  Yes Osvaldo Shipper, MD  atorvastatin (LIPITOR) 20 MG tablet Take 20 mg by mouth daily.   Yes Historical Provider, MD  baclofen (LIORESAL) 10 MG tablet Take 10 mg by mouth 2 (two) times daily as needed for muscle spasms.   Yes Historical Provider, MD  buPROPion (WELLBUTRIN XL) 300 MG 24 hr tablet Take 300 mg by mouth daily.  01/17/14  Yes Historical Provider, MD  Cholecalciferol (VITAMIN D) 2000 UNITS tablet Take 4,000 Units by mouth 2 (two) times daily.    Yes Historical Provider, MD  clonazePAM (KLONOPIN) 1 MG tablet Take 1 tablet (1 mg total) by mouth at bedtime. 12/23/14  Yes Tiffany L Reed, DO  clopidogrel (PLAVIX) 75 MG tablet Take 1 tablet (75 mg total) by mouth daily. 12/22/14  Yes Osvaldo Shipper, MD  Cyanocobalamin (VITAMIN B 12 PO) Take 20,000-25,000 mcg by mouth every morning.   Yes Historical Provider, MD  diphenoxylate-atropine (LOMOTIL) 2.5-0.025 MG per tablet Take 2 tablets by mouth 4 (four) times daily as needed for diarrhea or loose stools. 12/23/14  Yes Tiffany L Reed, DO  FLUoxetine  (PROZAC) 40 MG capsule Take 40 mg by mouth every morning.    Yes Historical Provider, MD  gabapentin (NEURONTIN) 100 MG capsule Take 1 capsule (100 mg total) by mouth every 8 (eight) hours. 12/22/14  Yes Osvaldo Shipper, MD  levothyroxine (SYNTHROID, LEVOTHROID) 200 MCG tablet Take 200 mcg by mouth daily before breakfast.   Yes Historical Provider, MD  losartan (COZAAR) 100 MG tablet Take 100 mg by mouth every morning.  10/15/11  Yes Historical Provider, MD  metFORMIN (GLUCOPHAGE) 500 MG tablet Take 1 tablet (500 mg total) by mouth 2 (two) times daily with a meal. 12/22/14  Yes Osvaldo Shipper, MD  nitrofurantoin, macrocrystal-monohydrate, (MACROBID) 100 MG capsule Take 100 mg by mouth 2 (two) times daily. 01/18/15  Yes Historical Provider, MD  ondansetron (ZOFRAN ODT) 4 MG disintegrating tablet Take 1 tablet (4 mg total) by mouth every 8 (eight) hours as needed for nausea or vomiting. 01/15/15  Yes Kristen N Ward, DO  oxyCODONE (OXY IR/ROXICODONE) 5 MG immediate release tablet Take 0.5 tablets (2.5 mg  total) by mouth every 4 (four) hours as needed for moderate pain. 12/23/14  Yes Tiffany L Reed, DO  pantoprazole (PROTONIX) 40 MG tablet Take 40 mg by mouth 2 (two) times daily.   Yes Historical Provider, MD  Umeclidinium-Vilanterol (ANORO ELLIPTA) 62.5-25 MCG/INH AEPB Inhale 1 application into the lungs daily.    Yes Historical Provider, MD  celecoxib (CELEBREX) 200 MG capsule Take 1 capsule (200 mg total) by mouth daily. Patient not taking: Reported on 01/20/2015 12/22/14   Osvaldo Shipper, MD  ciprofloxacin (CIPRO) 500 MG tablet Take 1 tablet (500 mg total) by mouth 2 (two) times daily. Patient not taking: Reported on 01/20/2015 01/15/15   Emilia Beck, PA-C  nicotine (NICODERM CQ - DOSED IN MG/24 HR) 7 mg/24hr patch Place 1 patch (7 mg total) onto the skin daily. Patient not taking: Reported on 01/20/2015 12/22/14   Osvaldo Shipper, MD     Physical Exam:  Filed Vitals:   01/20/15 1151 01/20/15 1304  01/20/15 1319 01/20/15 1358  BP: 121/65  134/83 140/87  Pulse: 77  86 87  Temp:  98.5 F (36.9 C)  98.2 F (36.8 C)  TempSrc:  Oral  Oral  Resp: SpO2: 95%  94% 95%    Constitutional: Vital signs reviewed.  Elderly female lying in bed in no acute distress HEENT: no pallor, no icterus, moist oral mucosa, no cervical lymphadenopathy, supple neck Cardiovascular: RRR, S1 normal, S2 normal, no MRG Chest: CTAB, no wheezes, rales, or rhonchi Abdominal: Soft. Mild right lower quadrant tenderness, non-distended, bowel sounds are normal, GU: no CVA tenderness Ext: warm, no edema Neurological: A&O x3, non focal, no motor weakness.  Labs on Admission:  Basic Metabolic Panel:  Recent Labs Lab 01/15/15 0037 01/20/15 1051  NA 137 139  K 4.0 3.6  CL 101 102  CO2 27 27  GLUCOSE 144* 125*  BUN 17 17  CREATININE 0.95 0.85  CALCIUM 9.3 9.5   Liver Function Tests:  Recent Labs Lab 01/15/15 0037 01/20/15 1051  AST 19 25  ALT 25 31  ALKPHOS 70 65  BILITOT 0.7 0.8  PROT 6.6 7.6  ALBUMIN 3.4* 3.9    Recent Labs Lab 01/15/15 0037 01/20/15 1051  LIPASE 17* 15*   No results for input(s): AMMONIA in the last 168 hours. CBC:  Recent Labs Lab 01/15/15 0037 01/20/15 1051  WBC 12.6* 12.0*  NEUTROABS 8.8* 9.4*  HGB 14.4 14.6  HCT 43.3 44.3  MCV 91.4 90.8  PLT 317 361   Cardiac Enzymes: No results for input(s): CKTOTAL, CKMB, CKMBINDEX, TROPONINI in the last 168 hours. BNP: Invalid input(s): POCBNP CBG: No results for input(s): GLUCAP in the last 168 hours.  Radiological Exams on Admission: No results found.  EKG: Normal sinus rhythm with QTC of 500. No ST-T changes  Assessment/Plan Principal problem Acute pyelonephritis with ESBL UTI Patient has persistent right lower quadrant abdominal pain with some back pain with ESBL UTI being treated with ciprofloxacin as outpatient followed by nitrofurantoin. Cultures is sensitive to Zosyn, gentamicin and imipenem.  Will start her on IV Primaxin. Patient reports history of rash with penicillin. Will monitor while inpatient. She tolerates Primaxin will need to discuss with ID for duration of antibiotics. She likely need a PICC line. Supportive care with gentle IV hydration, antiemetics and pain control. Start on clear liquid. If symptoms unimproved or has persistent abdominal pain will need repeat CT of the abdomen. Recent CT of the abdomen showed dilated CBD of 1.9  cm suggestive of postcholecystectomy syndrome.  Active Problems: Recent CVA Patient hospitalized one month back with acute CVA with mild left upper extremity residual weakness. 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 Patient on aspirin and Plavix for 3 months followed by Plavix alone. Continue statin. Started on metformin for new onset diabetes. Currently using a walker to ambulate. Ordered PT evaluation.   Essential hypertension Blood pressure stable. Resume home medications.  Hypothyroidism Continue Synthroid  Obstructive sleep apnea Continue nighttime CPAP  Obesity Needs counseling on weight loss and exercise.  New type 2 diabetes mellitus A1c of 6.6. Started on metformin. Will hold during his hospital stay and monitor on sliding insulin insulin  Aneurysmal dilatation of ascending aorta Seen on MRI during a recent hospitalization. Needs repeat CT scan angiogram or MRA in one year.  Bipolar depression Continue home medication.  Tobacco abuse Quit about 2 months back. Not using nicotine patch anymore.  History of multiple sclerosis Follows with neurology as outpatient. Reports history of urinary incontinence.  Diet: Clear liquid  DVT prophylaxis: sq lovenox   Code Status: Full code Family Communication:None at bedside Disposition Plan: Admit to [facility] Pager [phone]  Total time spent on admission :70 minutes  If  7PM-7AM, please contact night-coverage [email or web address] Password [email or web address] 01/20/2015, 2:42 PM

## 2015-01-20 NOTE — ED Notes (Signed)
Bed: WA20 Expected date:  Expected time:  Means of arrival:  Comments: N/V 

## 2015-01-21 LAB — GLUCOSE, CAPILLARY
GLUCOSE-CAPILLARY: 111 mg/dL — AB (ref 65–99)
GLUCOSE-CAPILLARY: 107 mg/dL — AB (ref 65–99)
GLUCOSE-CAPILLARY: 107 mg/dL — AB (ref 65–99)
Glucose-Capillary: 108 mg/dL — ABNORMAL HIGH (ref 65–99)

## 2015-01-21 MED ORDER — VANCOMYCIN HCL IN DEXTROSE 750-5 MG/150ML-% IV SOLN
750.0000 mg | Freq: Two times a day (BID) | INTRAVENOUS | Status: DC
  Administered 2015-01-21 – 2015-01-23 (×5): 750 mg via INTRAVENOUS
  Filled 2015-01-21 (×5): qty 150

## 2015-01-21 NOTE — Evaluation (Signed)
Physical Therapy Evaluation Patient Details Name: [name] [name] MRN: [id number] DOB: 1940-10-05 Today's Date: 01/21/2015   History of Present Illness  74 year old obese female adm with   nausea and vomiting with right lower abdominal and back pain-->pyelonephritis;  recent hospitalization for stroke, hypertension, hyperlipidemia, diabetes mellitus, MS; (recently discharged home from SNF--[facility])   Clinical Impression  Pt admitted with above diagnosis. Pt currently with functional limitations due to the deficits listed below (see PT Problem List).  Pt will benefit from skilled PT to increase their independence and safety with mobility to allow discharge to the venue listed below.  Pt feels she may need to return to [place], Dtr not present at time of eval, so unsure how much support/supervision dtr able to give; May be able to return home with HHPT, will continue to follow and assess for needs.       Follow Up Recommendations Home health PT (vs SNF depending on home support and dtr availability)    Equipment Recommendations  None recommended by PT    Recommendations for Other Services       Precautions / Restrictions Precautions Precautions: Fall      Mobility  Bed Mobility               General bed mobility comments: NT - pt in chair  Transfers Overall transfer level: Needs assistance Equipment used: Rolling walker (2 wheeled) Transfers: Sit to/from Stand Sit to Stand: Supervision         General transfer comment: cues for hand placement and overall safety, control of descent  Ambulation/Gait   Ambulation Distance (Feet): 140 Feet (10' more) Assistive device: Rolling walker (2 wheeled) Gait Pattern/deviations: Step-through pattern;Decreased stride length     General Gait Details: cues for posture, step length  Stairs            Wheelchair Mobility    Modified Rankin (Stroke Patients Only)       Balance Overall balance assessment: Needs  assistance         Standing balance support: During functional activity;Bilateral upper extremity supported;No upper extremity supported Standing balance-Leahy Scale: Fair                               Pertinent Vitals/Pain Pain Assessment: No/denies pain    Home Living Family/patient expects to be discharged to:: Private residence Living Arrangements: Children Available Help at Discharge: Family;Available 24 hours/day Type of Home: House Home Access: Level entry     Home Layout: One level Home Equipment: Walker - 2 wheels;Tub bench;Grab bars - toilet;Grab bars - tub/shower      Prior Function Level of Independence: Needs assistance   Gait / Transfers Assistance Needed: amb with RW  ADL's / Homemaking Assistance Needed: dtr assists with errands, grocer shopping, meal prep, laundry  Comments: pt  states she feels [facility] will have [facility]"     Hand Dominance        Extremity/Trunk Assessment   Upper Extremity Assessment:  (hand  tremors noted)           Lower Extremity Assessment: Generalized weakness         Communication   Communication: No difficulties  Cognition Arousal/Alertness: Awake/alert Behavior During Therapy: WFL for tasks assessed/performed;Flat affect Overall Cognitive Status: Within Functional Limits for tasks assessed  General Comments      Exercises        Assessment/Plan    PT Assessment Patient needs continued PT services  PT Diagnosis Difficulty walking   PT Problem List Decreased mobility;Decreased activity tolerance;Decreased balance  PT Treatment Interventions DME instruction;Gait training;Functional mobility training;Therapeutic activities;Patient/family education;Therapeutic exercise;Balance training   PT Goals (Current goals can be found in the Care Plan section) Acute Rehab PT Goals Patient Stated Goal: to get well and stay out of hospital PT Goal Formulation: With  patient Time For Goal Achievement: 02/04/15 Potential to Achieve Goals: Good    Frequency Min 3X/week   Barriers to discharge        Co-evaluation               End of Session Equipment Utilized During Treatment: Gait belt Activity Tolerance: Patient tolerated treatment well Patient left: in chair;with call bell/phone within reach Nurse Communication: Mobility status         Time: 8295-6213 PT Time Calculation (min) (ACUTE ONLY): 26 min   Charges:   PT Evaluation $Initial PT Evaluation Tier I: 1 Procedure PT Treatments $Gait Training: 8-22 mins   PT G Codes:        Nai Dasch 2015/01/28, 3:38 PM

## 2015-01-21 NOTE — Progress Notes (Signed)
CRITICAL VALUE ALERT  Critical value received:  BC anarobe Gram + cocci clusters  Date of notification:  01/21/2015  Time of notification:  0607  Critical value read back:Yes.    Nurse who received alert:  Barnett Hatter  MD notified (1st page):  Claiborne Billings, NP  Time of first page:  0610  MD notified (2nd page):  Time of second page:  Responding MD:  Claiborne Billings, NP  Time MD responded:  [phone] Pharmacy consulted for vanc orders

## 2015-01-21 NOTE — Progress Notes (Signed)
Rx Brief Antibiotic note:  Vancomycin See 7/29 note by Earlean Shawl for full details  Assessement:  Pt started on Primaxin 7/29 for ESBL UTI  Now with Gm+ cocci in clusters BC  Plan:  Vancomycin 750mg  IV q12h  F/u scr/cultures/levels  [name] [name] 01/21/2015 6:24 AM

## 2015-01-21 NOTE — Progress Notes (Signed)
[facility] PROGRESS NOTE  [name] [name] JXB:[id number] DOB: 09/04/1940 DOA: 01/20/2015 PCP: [name] Kayser, MD  brief narrative 74 year old obese female with a recent hospitalization for stroke, hypertension, hyperlipidemia and newly diagnosed diabetes mellitus, recently discharged home from skilled nursing facility was seen in the ED on 7/24 for nausea and vomiting with right lower abdominal and back pain. CT of the abdomen and pelvis done at that time was unremarkable except for CBG of 1.9 cm (patient is status post cholecystectomy). UA was positive for UTI and patient was discharged on ciprofloxacin. Since her symptoms did not improve and was persistently nauseous with several episode of vomiting for last 2 days with right lower quadrant abdominal pain and low back pain she returned to the ED. Urine culture done during her ED visit on 7/24 grew ESBL which was sensitive only to Zosyn, imipenem, gentamicin and nitrofurantoin. In the ED vitals were stable. She had mild leukocytosis. Patient admitted to hospital for persistent symptoms with resistant UTI and pyelonephritis.  Assessment/Plan: Principal problem Acute pyelonephritis with ESBL UTI -ESBL UTI was being treated with ciprofloxacin as outpatient followed by nitrofurantoin. Cultures is sensitive to Zosyn, gentamicin and imipenem.  - started  her on IV Primaxin. Patient reports history of rash with penicillin.  monitor while inpatient.  Will  likely need a PICC line. Supportive care with gentle IV hydration, antiemetics and pain control.   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  Active Problems:  GPC bacteremia 1/2 blood cx on admission growing GPC. Added empiric IV vancomycin. Check final culture results.   Recent CVA Patient hospitalized one month back with acute CVA with mild left upper extremity residual weakness. 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 Patient on aspirin and Plavix for 3 months followed by Plavix alone. Continue statin. Started on metformin for new onset diabetes. Currently using a walker to ambulate. Ordered PT evaluation.   Essential hypertension Blood pressure stable. Continue home medications.  Hypothyroidism Continue Synthroid  Obstructive sleep apnea Continue nighttime CPAP  Obesity Needs counseling on weight loss and exercise.  New type 2 diabetes mellitus A1c of 6.6.Marland Kitchen  monitor on sliding insulin insulin  Aneurysmal dilatation of ascending aorta Seen on MRI during a recent hospitalization. Needs repeat CT scan angiogram or MRA in one year.  Bipolar depression Continue home medication.  Tobacco abuse Quit about 2 months back. Not using nicotine patch anymore.  History of multiple sclerosis Follows with neurology as outpatient. Reports history of urinary incontinence.  Diet: full liquid  DVT prophylaxis: sq lovenox   Code Status: Full code Family Communication:None at bedside Disposition Plan: inatient   HPI/Subjective: Patient seen and examined. Reports occasional nausea but clinically better. No abdominal pain.  Objective: Filed Vitals:   01/21/15 0507  BP: 146/88  Pulse: 80  Temp: 97.6 F (36.4 C)  Resp: 20    Intake/Output Summary (Last 24 hours) at 01/21/15 1129 Last data filed at 01/21/15 1308  Gross per 24 hour  Intake   1750 ml  Output      0 ml  Net   1750 ml   Filed Weights   01/20/15 1358  Weight: 100.5 kg (221 lb 9 oz)    Exam:   General:  Elderly female in no acute distress  HEENT: No pallor, most oral mucosa: Supple  Chest: Clear to auscultation bilaterally  CVS: Normal S1 and  S2, no murmurs  GI: Soft, nontender, nondistended, bowel sounds present  Skin: Warm, no edema  CNS: Alert and oriented,  fine hand tremors, no focal deficit    Data Reviewed: Basic Metabolic Panel:  Recent Labs Lab  01/15/15 0037 01/20/15 1051  NA 137 139  K 4.0 3.6  CL 101 102  CO2 27 27  GLUCOSE 144* 125*  BUN 17 17  CREATININE 0.95 0.85  CALCIUM 9.3 9.5   Liver Function Tests:  Recent Labs Lab 01/15/15 0037 01/20/15 1051  AST 19 25  ALT 25 31  ALKPHOS 70 65  BILITOT 0.7 0.8  PROT 6.6 7.6  ALBUMIN 3.4* 3.9    Recent Labs Lab 01/15/15 0037 01/20/15 1051  LIPASE 17* 15*   No results for input(s): AMMONIA in the last 168 hours. CBC:  Recent Labs Lab 01/15/15 0037 01/20/15 1051  WBC 12.6* 12.0*  NEUTROABS 8.8* 9.4*  HGB 14.4 14.6  HCT 43.3 44.3  MCV 91.4 90.8  PLT 317 361   Cardiac Enzymes: No results for input(s): CKTOTAL, CKMB, CKMBINDEX, TROPONINI in the last 168 hours. BNP (last 3 results)  Recent Labs  11/27/14 1827 01/15/15 0037  BNP 38.8 36.3    ProBNP (last 3 results) No results for input(s): PROBNP in the last 8760 hours.  CBG:  Recent Labs Lab 01/20/15 1651 01/20/15 2145 01/21/15 0724  GLUCAP 91 89 111*    Recent Results (from the past 240 hour(s))  Urine culture     Status: None   Collection Time: 01/15/15  1:49 AM  Result Value Ref Range Status   Specimen Description URINE, CLEAN CATCH  Final   Special Requests Normal  Final   Culture   Final    >=100,000 COLONIES/mL ESCHERICHIA COLI Confirmed Extended Spectrum Beta-Lactamase Producer (ESBL)    Report Status 01/17/2015 FINAL  Final   Organism ID, Bacteria ESCHERICHIA COLI  Final      Susceptibility   Escherichia coli - MIC*    AMPICILLIN >=32 RESISTANT Resistant     CEFAZOLIN >=64 RESISTANT Resistant     CEFTRIAXONE >=64 RESISTANT Resistant     CIPROFLOXACIN >=4 RESISTANT Resistant     GENTAMICIN <=1 SENSITIVE Sensitive     IMIPENEM <=0.25 SENSITIVE Sensitive     NITROFURANTOIN <=16 SENSITIVE Sensitive     TRIMETH/SULFA >=320 RESISTANT Resistant     AMPICILLIN/SULBACTAM >=32 RESISTANT Resistant     PIP/TAZO 8 SENSITIVE Sensitive     * >=100,000 COLONIES/mL ESCHERICHIA COLI   Blood culture (routine x 2)     Status: None (Preliminary result)   Collection Time: 01/20/15 10:45 AM  Result Value Ref Range Status   Specimen Description BLOOD RIGHT HAND  Final   Special Requests BOTTLES DRAWN AEROBIC AND ANAEROBIC  Final   Culture  Setup Time   Final    GRAM POSITIVE COCCI IN CLUSTERS ANAEROBIC BOTTLE ONLY CRITICAL RESULT CALLED TO, READ BACK BY AND VERIFIED WITH: Jilda Roche [id number] [facility]    Culture   Final    CULTURE REINCUBATED FOR BETTER GROWTH Performed at [facility]    Report Status PENDING  Incomplete  Urine culture     Status: None (Preliminary result)   Collection Time: 01/20/15 10:59 AM  Result Value Ref Range Status   Specimen Description URINE, CLEAN CATCH  Final   Special Requests NONE  Final   Culture   Final    40,000 COLONIES/ml ESCHERICHIA COLI Performed at [facility]    Report  Status PENDING  Incomplete     Studies: No results found.  Scheduled Meds: . amLODipine  5 mg Oral Daily  . aspirin EC  81 mg Oral Daily  . atorvastatin  20 mg Oral Daily  . buPROPion  300 mg Oral Daily  . cholecalciferol  4,000 Units Oral BID  . clonazePAM  1 mg Oral QHS  . clopidogrel  75 mg Oral Daily  . enoxaparin (LOVENOX) injection  50 mg Subcutaneous Q24H  . FLUoxetine  40 mg Oral Daily  . gabapentin  100 mg Oral 3 times per day  . imipenem-cilastatin  500 mg Intravenous 3 times per day  . insulin aspart  0-15 Units Subcutaneous TID WC  . insulin aspart  0-5 Units Subcutaneous QHS  . levothyroxine  200 mcg Oral QAC breakfast  . losartan  100 mg Oral Daily  . pantoprazole  40 mg Oral BID  . Umeclidinium-Vilanterol  1 puff Inhalation Daily  . vancomycin  750 mg Intravenous BID   Continuous Infusions: . sodium chloride 75 mL/hr at 01/21/15 0439      Time spent: 25 minutes    Oak Dorey  [facility] Pager [phone]. If 7PM-7AM, please contact night-coverage at [email or web address], password  [facility] 01/21/2015, 11:29 AM  LOS: 1 day

## 2015-01-22 DIAGNOSIS — Z1612 Extended spectrum beta lactamase (ESBL) resistance: Secondary | ICD-10-CM

## 2015-01-22 DIAGNOSIS — A499 Bacterial infection, unspecified: Secondary | ICD-10-CM

## 2015-01-22 LAB — URINE CULTURE

## 2015-01-22 LAB — GLUCOSE, CAPILLARY
Glucose-Capillary: 137 mg/dL — ABNORMAL HIGH (ref 65–99)
Glucose-Capillary: 102 mg/dL — ABNORMAL HIGH (ref 65–99)
Glucose-Capillary: 99 mg/dL (ref 65–99)
Glucose-Capillary: 130 mg/dL — ABNORMAL HIGH (ref 65–99)

## 2015-01-22 NOTE — Progress Notes (Signed)
Pt placed on cpap around 2330 and taken off at 0130, pt stating that the machine is louder than hers at home and she cant rest comfortably wearing it. Encouraged pt to wear cpap for a few more hours, but she states she cant at this time. Pt placed back on [place] at that time and resting comfortably.

## 2015-01-22 NOTE — Progress Notes (Signed)
[facility] PROGRESS NOTE  [name] [name] NFA:[id number] DOB: [birth year] DOA: 01/20/2015 PCP: Ezequiel Kayser, MD  brief narrative 74 year old obese female with a recent hospitalization for stroke, hypertension, hyperlipidemia and newly diagnosed diabetes mellitus, recently discharged home from skilled nursing facility was seen in the ED on 7/24 for nausea and vomiting with right lower abdominal and back pain. CT of the abdomen and pelvis done at that time was unremarkable except for CBG of 1.9 cm (patient is status post cholecystectomy). UA was positive for UTI and patient was discharged on ciprofloxacin. Since her symptoms did not improve and was persistently nauseous with several episode of vomiting for last 2 days with right lower quadrant abdominal pain and low back pain she returned to the ED. Urine culture done during her ED visit on 7/24 grew ESBL which was sensitive only to Zosyn, imipenem, gentamicin and nitrofurantoin. In the ED vitals were stable. She had mild leukocytosis. Patient admitted to hospital for persistent symptoms with resistant UTI and pyelonephritis.  Assessment/Plan: Principal problem Acute pyelonephritis with ESBL UTI -ESBL UTI was being treated with ciprofloxacin as outpatient followed by nitrofurantoin. Cultures  sensitive to Zosyn, gentamicin and imipenem.  - started  her on IV Primaxin. Patient reports history of rash with penicillin.  Has been tolerating Primaxin.  Will need a PICC line once repeat blood cultures clearing infection. We discussed with ID on duration of antibiotics. Supportive care with gentle IV hydration, antiemetics and pain control.   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  Active Problems:  GPC bacteremia 1/2 blood cx on admission growing GPC. Added empiric IV vancomycin. final culture results pending.   Recent CVA Patient hospitalized one month back with acute CVA with mild  left upper extremity residual weakness. 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 Patient on aspirin and Plavix for 3 months followed by Plavix alone. Continue statin. Started on metformin for new onset diabetes. Currently using a walker to ambulate. - PT evaluation recommends home with services versus skilled nursing facility..   Essential hypertension Blood pressure stable. Continue home medications.  Hypothyroidism Continue Synthroid  Obstructive sleep apnea Continue nighttime CPAP  Obesity Needs counseling on weight loss and exercise.  New type 2 diabetes mellitus A1c of 6.6.Marland Kitchen  monitor on sliding insulin insulin  Aneurysmal dilatation of ascending aorta Seen on MRI during a recent hospitalization. Needs repeat CT scan angiogram or MRA in one year.  Bipolar depression Continue home medication.  Tobacco abuse Quit about 2 months back. Not using nicotine patch anymore.  History of multiple sclerosis Follows with neurology as outpatient. Reports history of urinary incontinence.  Diet: Diabetic/heart healthy  DVT prophylaxis: sq lovenox   Code Status: Full code Family Communication:None at bedside Disposition Plan: Continue patient monitoring for now.   HPI/Subjective: Patient seen and examined. He reports being unable to sleep properly in the night and could not use CPAP. Reports occasional right lower cord and abdominal pain. Denies dysuria, nausea vomiting.  Objective: Filed Vitals:   01/22/15 0626  BP: 128/84  Pulse: 79  Temp: 98.4 F (36.9 C)  Resp: 20    Intake/Output Summary (Last 24 hours) at 01/22/15 1141 Last data filed at 01/22/15 0902  Gross per 24 hour  Intake   1080 ml  Output      0 ml  Net   1080 ml   [employer]  01/20/15 1358  Weight: 100.5 kg (221 lb 9 oz)    Exam:   General:  no acute distress  HEENT:  most oral mucosa  Chest: Clear to auscultation  bilaterally  CVS: Normal S1 and S2, no murmurs  GI: Soft, nontender, nondistended, bowel sounds present  Skin: Warm, no edema  CNS: Alert and oriented,  fine hand tremors,     Data Reviewed: Basic Metabolic Panel:  Recent Labs Lab 01/20/15 1051  NA 139  K 3.6  CL 102  CO2 27  GLUCOSE 125*  BUN 17  CREATININE 0.85  CALCIUM 9.5   Liver Function Tests:  Recent Labs Lab 01/20/15 1051  AST 25  ALT 31  ALKPHOS 65  BILITOT 0.8  PROT 7.6  ALBUMIN 3.9    Recent Labs Lab 01/20/15 1051  LIPASE 15*   No results for input(s): AMMONIA in the last 168 hours. CBC:  Recent Labs Lab 01/20/15 1051  WBC 12.0*  NEUTROABS 9.4*  HGB 14.6  HCT 44.3  MCV 90.8  PLT 361   Cardiac Enzymes: No results for input(s): CKTOTAL, CKMB, CKMBINDEX, TROPONINI in the last 168 hours. BNP (last 3 results)  Recent Labs  11/27/14 1827 01/15/15 0037  BNP 38.8 36.3    ProBNP (last 3 results) No results for input(s): PROBNP in the last 8760 hours.  CBG:  Recent Labs Lab 01/21/15 0724 01/21/15 1141 01/21/15 1644 01/21/15 2126 01/22/15 0741  GLUCAP 111* 107* 108* 107* 137*    Recent Results (from the past 240 hour(s))  Urine culture     Status: None   Collection Time: 01/15/15  1:49 AM  Result Value Ref Range Status   Specimen Description URINE, CLEAN CATCH  Final   Special Requests Normal  Final   Culture   Final    >=100,000 COLONIES/mL ESCHERICHIA COLI Confirmed Extended Spectrum Beta-Lactamase Producer (ESBL)    Report Status 01/17/2015 FINAL  Final   Organism ID, Bacteria ESCHERICHIA COLI  Final      Susceptibility   Escherichia coli - MIC*    AMPICILLIN >=32 RESISTANT Resistant     CEFAZOLIN >=64 RESISTANT Resistant     CEFTRIAXONE >=64 RESISTANT Resistant     CIPROFLOXACIN >=4 RESISTANT Resistant     GENTAMICIN <=1 SENSITIVE Sensitive     IMIPENEM <=0.25 SENSITIVE Sensitive     NITROFURANTOIN <=16 SENSITIVE Sensitive     TRIMETH/SULFA >=320 RESISTANT  Resistant     AMPICILLIN/SULBACTAM >=32 RESISTANT Resistant     PIP/TAZO 8 SENSITIVE Sensitive     * >=100,000 COLONIES/mL ESCHERICHIA COLI  Blood culture (routine x 2)     Status: None (Preliminary result)   Collection Time: 01/20/15 10:45 AM  Result Value Ref Range Status   Specimen Description BLOOD RIGHT HAND  Final   Special Requests BOTTLES DRAWN AEROBIC AND ANAEROBIC  Final   Culture  Setup Time   Final    GRAM POSITIVE COCCI IN CLUSTERS ANAEROBIC BOTTLE ONLY CRITICAL RESULT CALLED TO, READ BACK BY AND VERIFIED WITH: Jilda Roche [id number] [facility]    Culture   Final    GRAM POSITIVE COCCI Performed at [facility]    Report Status PENDING  Incomplete  Blood culture (routine x 2)     Status: None (Preliminary result)   Collection Time: 01/20/15 10:51 AM  Result Value Ref Range Status   Specimen Description BLOOD LEFT ARM  Final   Special Requests   Final    BOTTLES DRAWN AEROBIC AND ANAEROBIC  AER ANA   Culture   Final    NO GROWTH < 24 HOURS Performed at [facility]    Report Status PENDING  Incomplete  Urine culture     Status: None (Preliminary result)   Collection Time: 01/20/15 10:59 AM  Result Value Ref Range Status   Specimen Description URINE, CLEAN CATCH  Final   Special Requests NONE  Final   Culture   Final    40,000 COLONIES/ml ESCHERICHIA COLI Performed at [facility]    Report Status PENDING  Incomplete     Studies: No results found.  Scheduled Meds: . amLODipine  5 mg Oral Daily  . aspirin EC  81 mg Oral Daily  . atorvastatin  20 mg Oral Daily  . buPROPion  300 mg Oral Daily  . cholecalciferol  4,000 Units Oral BID  . clonazePAM  1 mg Oral QHS  . clopidogrel  75 mg Oral Daily  . enoxaparin (LOVENOX) injection  50 mg Subcutaneous Q24H  . FLUoxetine  40 mg Oral Daily  . gabapentin  100 mg Oral 3 times per day  . imipenem-cilastatin  500 mg Intravenous 3 times per day  . insulin aspart  0-15 Units  Subcutaneous TID WC  . insulin aspart  0-5 Units Subcutaneous QHS  . levothyroxine  200 mcg Oral QAC breakfast  . losartan  100 mg Oral Daily  . pantoprazole  40 mg Oral BID  . Umeclidinium-Vilanterol  1 puff Inhalation Daily  . vancomycin  750 mg Intravenous BID   Continuous Infusions: . sodium chloride 75 mL/hr at 01/21/15 0439      Time spent: 25 minutes    Tiphanie Vo  [facility] Pager [phone]. If 7PM-7AM, please contact night-coverage at [email or web address], password [facility] 01/22/2015, 11:41 AM  LOS: 2 days

## 2015-01-22 NOTE — Progress Notes (Signed)
Patient declines the use of nocturnal CPAP tonight. RT will continue to follow.  

## 2015-01-23 DIAGNOSIS — N390 Urinary tract infection, site not specified: Secondary | ICD-10-CM

## 2015-01-23 LAB — CULTURE, BLOOD (ROUTINE X 2)

## 2015-01-23 LAB — GLUCOSE, CAPILLARY
Glucose-Capillary: 103 mg/dL — ABNORMAL HIGH (ref 65–99)
Glucose-Capillary: 115 mg/dL — ABNORMAL HIGH (ref 65–99)

## 2015-01-23 LAB — VANCOMYCIN, TROUGH: VANCOMYCIN TR: 15 ug/mL (ref 10.0–20.0)

## 2015-01-23 MED ORDER — FOSFOMYCIN TROMETHAMINE 3 G PO PACK
3.0000 g | PACK | Freq: Once | ORAL | Status: AC
  Administered 2015-01-23: 3 g via ORAL
  Filled 2015-01-23: qty 3

## 2015-01-23 MED ORDER — FOSFOMYCIN TROMETHAMINE 3 G PO PACK: 3.0000 g | PACK | Freq: Once | ORAL | Status: DC

## 2015-01-23 NOTE — Discharge Summary (Signed)
Physician Discharge Summary  [name] [name] ZOX:[id number] DOB: 07/09/1940 DOA: 01/20/2015  PCP: Ezequiel Kayser, MD  Admit date: 01/20/2015 Discharge date: 01/23/2015  Time spent:35 minutes  Recommendations for Outpatient Follow-up:  1. Discharge home with outpatient follow-up with PCP in one week 2. Patient will an take an additional dose fosfomycin on 8/3 for her ESBL UTI 3. Patient has aneurysmal dilatation of ascending aorta seen on MRI done during last hospitalization and recommend follow-up CT angiogram or MRA in one year.   Discharge Diagnoses:  Principal Problem:   Acute pyelonephritis   Active Problems:   Hyperlipidemia   Obstructive sleep apnea   HTN (hypertension)   Obesity   Depression   Hypothyroidism   UTI (lower urinary tract infection)   History of ESBL E. coli infection   CVA (cerebral vascular accident)  Type 2 diabetes mellitus   Discharge Condition: Fair  Diet recommendation: Heart healthy  Filed Weights   01/20/15 1358 01/23/15 0506  Weight: 100.5 kg (221 lb 9 oz) 101.2 kg (223 lb 1.7 oz)    History of present illness:  Please refer to admission H&P for details, in brief,74 year old obese female with a recent hospitalization for stroke, hypertension, hyperlipidemia and newly diagnosed diabetes mellitus, recently discharged home from skilled nursing facility was seen in the ED on 7/24 for nausea and vomiting with right lower abdominal and back pain. CT of the abdomen and pelvis done at that time was unremarkable except for CBG of 1.9 cm (patient is status post cholecystectomy). UA was positive for UTI and patient was discharged on ciprofloxacin. Since her symptoms did not improve and was persistently nauseous with several episode of vomiting for last 2 days with right lower quadrant abdominal pain and low back pain she returned to the ED. Urine culture done during her ED visit on 7/24 grew ESBL which was sensitive only to Zosyn, imipenem, gentamicin and  nitrofurantoin. In the ED vitals were stable. She had mild leukocytosis. Patient admitted to hospital for persistent symptoms with resistant UTI and pyelonephritis.  Hospital Course:  Principal problem Acute pyelonephritis with ESBL UTI -ESBL UTI was being treated with ciprofloxacin as outpatient followed by nitrofurantoin. Cultures sensitive to Zosyn, gentamicin and imipenem.  - Placed on empiric IV Primaxin. Patient reports history of rash with penicillin. Tolerated Primaxin.Marland Kitchen Recent CT of the abdomen showed dilated CBD of 1.9 cm suggestive of postcholecystectomy syndrome. -Symptoms of abdominal pain and nausea resolved. Discussed with ID consult Dr. Ilsa Iha on the phone and recommends giving her 2 doses of oral fosfomycin. Patient received a dose of fosfomycin prior to discharge and prescription provided for next dose on 8/3.  Active Problems:  GPC bacteremia 1/2 blood cx on admission growing GPC. Added empiric IV vancomycin. Cultures growing coagulase-negative staph which is likely a contaminant. Repeat blood cultures negative  Recent CVA Patient hospitalized one month back with acute CVA with mild left upper extremity residual weakness. 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 Patient on aspirin and Plavix for 3 months followed by Plavix alone. Continue statin. Started on metformin for new onset diabetes. Currently using a walker to ambulate. - PT evaluation recommends home with services versus skilled nursing facility but patient refused and wanted to go home. Ordered for home health PT/OT.   Essential hypertension Blood pressure stable. Continue home medications.  Hypothyroidism Continue Synthroid  Obstructive sleep apnea Continue nighttime CPAP.  Obesity Needs counseling on weight loss and exercise.  New type 2 diabetes mellitus A1c of 6.6.. Continue metformin.  Aneurysmal dilatation of ascending  aorta Seen on MRI during a recent hospitalization. Needs repeat CT scan angiogram or MRA in one year.  Bipolar depression Continue home medication.  Tobacco abuse Quit about 2 months back. Not using nicotine patch anymore.  History of multiple sclerosis Follows with neurology as outpatient. Reports history of urinary incontinence.  Diet: Diabetic/heart healthy     Code Status: Full code Family Communication:None at bedside Disposition Plan: discharge home with home health. Patient refused skilled nursing facility  Discharge Exam: Filed Vitals:   01/23/15 0506  BP: 106/50  Pulse: 60  Temp: 97.8 F (36.6 C)  Resp: 17    General: Elderly female in no acute distress HEENT: No pallor, moist oral mucosa, supple neck Chest: Clear to auscultation bilaterally CVS: Normal S1 and S2, no gallop Genitalia: Soft, nondistended, nontender, bowel sounds present Musculoskeletal : warm, no edema  CNS: Alert and oriented, mild residual weakness on the left upper extremity from stroke with some fine tremors   Discharge Instructions    Discharge Medication List as of 01/23/2015 12:37 PM    CONTINUE these medications which have CHANGED   Details  fosfomycin (MONUROL) 3 G PACK Take 3 g by mouth once., Starting 01/26/2015, No Print      CONTINUE these medications which have NOT CHANGED   Details  acetaminophen (TYLENOL) 325 MG tablet Take 2 tablets (650 mg total) by mouth every 6 (six) hours as needed for mild pain (or Fever >/= 101)., Starting 12/22/2014, Until Discontinued, No Print    amLODipine (NORVASC) 5 MG tablet Take 5 mg by mouth daily., Until Discontinued, Historical Med    aspirin EC 81 MG EC tablet Take 1 tablet (81 mg total) by mouth daily. For 3 months. And then Plavix alone., Starting 12/22/2014, Until Discontinued, No Print    atorvastatin (LIPITOR) 20 MG tablet Take 20 mg by mouth daily., Until Discontinued, Historical Med    baclofen (LIORESAL) 10 MG tablet Take 10 mg  by mouth 2 (two) times daily as needed for muscle spasms., Until Discontinued, Historical Med    buPROPion (WELLBUTRIN XL) 300 MG 24 hr tablet Take 300 mg by mouth daily. , Starting 01/17/2014, Until Discontinued, Historical Med    Cholecalciferol (VITAMIN D) 2000 UNITS tablet Take 4,000 Units by mouth 2 (two) times daily. , Until Discontinued, Historical Med    clonazePAM (KLONOPIN) 1 MG tablet Take 1 tablet (1 mg total) by mouth at bedtime., Starting 12/23/2014, Until Discontinued, Print    clopidogrel (PLAVIX) 75 MG tablet Take 1 tablet (75 mg total) by mouth daily., Starting 12/22/2014, Until Discontinued, No Print    diphenoxylate-atropine (LOMOTIL) 2.5-0.025 MG per tablet Take 2 tablets by mouth 4 (four) times daily as needed for diarrhea or loose stools., Starting 12/23/2014, Until Discontinued, Print    FLUoxetine (PROZAC) 40 MG capsule Take 40 mg by mouth every morning. , Until Discontinued, Historical Med    gabapentin (NEURONTIN) 100 MG capsule Take 1 capsule (100 mg total) by mouth every 8 (eight) hours., Starting 12/22/2014, Until Discontinued, No Print    levothyroxine (SYNTHROID, LEVOTHROID) 200 MCG tablet Take 200 mcg by mouth daily before breakfast., Until Discontinued, Historical Med    losartan (COZAAR) 100 MG tablet Take 100 mg by mouth every morning. , Starting 10/15/2011, Until Discontinued, Historical Med    metFORMIN (GLUCOPHAGE) 500 MG tablet Take 1 tablet (500 mg total) by mouth 2 (two) times daily with a  meal., Starting 12/22/2014, Until Discontinued, No Print    ondansetron (ZOFRAN ODT) 4 MG disintegrating tablet Take 1 tablet (4 mg total) by mouth every 8 (eight) hours as needed for nausea or vomiting., Starting 01/15/2015, Until Discontinued, Print    oxyCODONE (OXY IR/ROXICODONE) 5 MG immediate release tablet Take 0.5 tablets (2.5 mg total) by mouth every 4 (four) hours as needed for moderate pain., Starting 12/23/2014, Until Discontinued, Print    pantoprazole (PROTONIX)  40 MG tablet Take 40 mg by mouth 2 (two) times daily., Until Discontinued, Historical Med    Umeclidinium-Vilanterol (ANORO ELLIPTA) 62.5-25 MCG/INH AEPB Inhale 1 application into the lungs daily. , Until Discontinued, Historical Med      STOP taking these medications     nitrofurantoin, macrocrystal-monohydrate, (MACROBID) 100 MG capsule      celecoxib (CELEBREX) 200 MG capsule      ciprofloxacin (CIPRO) 500 MG tablet      nicotine (NICODERM CQ - DOSED IN MG/24 HR) 7 mg/24hr patch        Allergies  Allergen Reactions  . Codeine Other (See Comments)    Real bad chest pains  . Lisinopril Cough  . Demerol Nausea Only  . Penicillins Rash and Other (See Comments)    Pt not sure "chestpains"   Follow-up Information    Follow up with PERINI,MARK A, MD. Schedule an appointment as soon as possible for a visit in 1 week.   Specialty:  Internal Medicine   Contact information:   [address] [place] [address] [phone]        The results of significant diagnostics from this hospitalization (including imaging, microbiology, ancillary and laboratory) are listed below for reference.    Significant Diagnostic Studies: Dg Chest 2 View  01/15/2015   CLINICAL DATA:  Acute onset of weakness and nausea. Decreased O2 saturation. Initial encounter.  EXAM: CHEST  2 VIEW  COMPARISON:  Chest radiograph performed 12/17/2014, and CT of the chest performed 11/27/2014  FINDINGS: The lungs are well-aerated. Vascular congestion is noted, with mildly increased interstitial markings, possibly reflecting minimal interstitial edema. There is no evidence of pleural effusion or pneumothorax.  The heart is borderline normal in size. No acute osseous abnormalities are seen.  IMPRESSION: Vascular congestion, with mildly increased interstitial markings, possibly reflecting minimal interstitial edema.   Electronically Signed   By: Roanna Raider M.D.   On: 01/15/2015 03:50   Ct Head Wo  Contrast  01/15/2015   CLINICAL DATA:  Initial evaluation for one-day history of acute headache. History of prior stroke.  EXAM: CT HEAD WITHOUT CONTRAST  TECHNIQUE: Contiguous axial images were obtained from the base of the skull through the vertex without intravenous contrast.  COMPARISON:  Prior study from 12/18/2014.  FINDINGS: Generalized cerebral atrophy with chronic microvascular ischemic disease is present. Recent identified patchy right MCA territory infarcts grossly similar. Prominent vascular calcifications present within the carotid siphons and distal vertebral arteries.  No acute large vessel territory infarct. No intracranial hemorrhage. No mass lesion, midline shift, or mass effect. No hydrocephalus. No extra-axial fluid collection.  Scalp soft tissues within normal limits. No acute abnormality about the orbits.  Calvarium intact.  Minimal layering opacity within the left sphenoid sinus. Paranasal sinuses are otherwise clear. No mastoid effusion.  IMPRESSION: 1. No acute intracranial process. 2. Generalized cerebral atrophy with chronic microvascular ischemic disease.   Electronically Signed   By: Rise Mu M.D.   On: 01/15/2015 04:03   Ct Abdomen Pelvis W Contrast  01/15/2015  CLINICAL DATA:  Acute onset of epigastric abdominal pain and vomiting. Initial encounter.  EXAM: CT ABDOMEN AND PELVIS WITH CONTRAST  TECHNIQUE: Multidetector CT imaging of the abdomen and pelvis was performed using the standard protocol following bolus administration of intravenous contrast.  CONTRAST:  OMNIPAQUE IOHEXOL 300 MG/ML  SOLN  COMPARISON:  CT of the abdomen and pelvis from 11/27/2014  FINDINGS: Minimal bibasilar atelectasis is noted.  The liver and spleen are unremarkable in appearance. The patient is status post cholecystectomy. The common bile duct is dilated to 1.9 cm, somewhat prominent status post cholecystectomy, with mild prominence of the intrahepatic biliary ducts. This may remain  within normal limits, and is stable from the prior study, though would correlate for symptoms of post-cholecystectomy syndrome.  The pancreas and adrenal glands are unremarkable.  Mild scarring is noted near the lower pole of the right kidney, with a 2.4 cm cyst at the lower pole of the right kidney. There is no evidence of hydronephrosis. No renal or ureteral stones are seen. No perinephric stranding is appreciated.  No free fluid is identified. The small bowel is unremarkable in appearance. The stomach is within normal limits. No acute vascular abnormalities are seen. Scattered calcification is noted along the abdominal aorta and its branches.  The appendix is not definitely seen; there is no evidence appendicitis. A single diverticulum is noted along the mid sigmoid colon. The colon is unremarkable in appearance.  The bladder is mildly distended and grossly unremarkable. Slight irregularity involving the anterior aspect of the bladder wall is thought to be chronic in nature. The patient is status post hysterectomy. No suspicious adnexal masses are seen. No inguinal lymphadenopathy is seen.  No acute osseous abnormalities are identified. The patient is status post anterior lumbar spinal fusion at L4-L5. Underlying chronic fusion of the facets is seen.  IMPRESSION: 1. No acute abnormality seen to explain the patient's symptoms. 2. Status post cholecystectomy. Common bile duct is dilated to 1.9 cm, with mild prominence of the intrahepatic biliary ducts. 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 3. Mild scarring near the lower pole of the right kidney, with a small right renal cyst. 4. Scattered calcification along the abdominal aorta and its branches.   Electronically Signed   By: Roanna Raider M.D.   On: 01/15/2015 03:55    Microbiology: Recent Results (from the past 240 hour(s))  Urine  culture     Status: None   Collection Time: 01/15/15  1:49 AM  Result Value Ref Range Status   Specimen Description URINE, CLEAN CATCH  Final   Special Requests Normal  Final   Culture   Final    >=100,000 COLONIES/mL ESCHERICHIA COLI Confirmed Extended Spectrum Beta-Lactamase Producer (ESBL)    Report Status 01/17/2015 FINAL  Final   Organism ID, Bacteria ESCHERICHIA COLI  Final      Susceptibility   Escherichia coli - MIC*    AMPICILLIN >=32 RESISTANT Resistant     CEFAZOLIN >=64 RESISTANT Resistant     CEFTRIAXONE >=64 RESISTANT Resistant     CIPROFLOXACIN >=4 RESISTANT Resistant     GENTAMICIN <=1 SENSITIVE Sensitive     IMIPENEM <=0.25 SENSITIVE Sensitive     NITROFURANTOIN <=16 SENSITIVE Sensitive     TRIMETH/SULFA >=320 RESISTANT Resistant     AMPICILLIN/SULBACTAM >=32 RESISTANT Resistant     PIP/TAZO 8 SENSITIVE Sensitive     * >=100,000 COLONIES/mL ESCHERICHIA  COLI  Blood culture (routine x 2)     Status: None   Collection Time: 01/20/15 10:45 AM  Result Value Ref Range Status   Specimen Description BLOOD RIGHT HAND  Final   Special Requests BOTTLES DRAWN AEROBIC AND ANAEROBIC  Final   Culture  Setup Time   Final    GRAM POSITIVE COCCI IN CLUSTERS ANAEROBIC BOTTLE ONLY CRITICAL RESULT CALLED TO, READ BACK BY AND VERIFIED WITH: Jilda Roche [id number] WILDERK    Culture   Final    STAPHYLOCOCCUS SPECIES (COAGULASE NEGATIVE) THE SIGNIFICANCE OF ISOLATING THIS ORGANISM FROM A SINGLE SET OF BLOOD CULTURES WHEN MULTIPLE SETS ARE DRAWN IS UNCERTAIN. PLEASE NOTIFY THE MICROBIOLOGY DEPARTMENT WITHIN ONE WEEK IF SPECIATION AND SENSITIVITIES ARE REQUIRED. Performed at [facility]    Report Status 01/23/2015 FINAL  Final  Blood culture (routine x 2)     Status: None (Preliminary result)   Collection Time: 01/20/15 10:51 AM  Result Value Ref Range Status   Specimen Description BLOOD LEFT ARM  Final   Special Requests   Final    BOTTLES DRAWN AEROBIC AND ANAEROBIC  AER ANA   Culture   Final    NO GROWTH 2 DAYS Performed at [facility]    Report Status PENDING  Incomplete  Urine culture     Status: None   Collection Time: 01/20/15 10:59 AM  Result Value Ref Range Status   Specimen Description URINE, CLEAN CATCH  Final   Special Requests NONE  Final   Culture   Final    40,000 COLONIES/ml ESCHERICHIA COLI Confirmed Extended Spectrum Beta-Lactamase Producer (ESBL) Performed at [facility]    Report Status 01/22/2015 FINAL  Final   Organism ID, Bacteria ESCHERICHIA COLI  Final      Susceptibility   Escherichia coli - MIC*    AMPICILLIN >=32 RESISTANT Resistant     CEFAZOLIN >=64 RESISTANT Resistant     CEFTRIAXONE >=64 RESISTANT Resistant     CIPROFLOXACIN >=4 RESISTANT Resistant     GENTAMICIN <=1 SENSITIVE Sensitive     IMIPENEM <=0.25 SENSITIVE Sensitive     NITROFURANTOIN <=16 SENSITIVE Sensitive     TRIMETH/SULFA >=320 RESISTANT Resistant     AMPICILLIN/SULBACTAM >=32 RESISTANT Resistant     PIP/TAZO 8 SENSITIVE Sensitive     * 40,000 COLONIES/ml ESCHERICHIA COLI  Culture, blood (routine x 2)     Status: None (Preliminary result)   Collection Time: 01/22/15  9:12 AM  Result Value Ref Range Status   Specimen Description BLOOD RIGHT HAND  Final   Special Requests BOTTLES DRAWN AEROBIC ONLY 10CC  Final   Culture PENDING  Incomplete   Report Status PENDING  Incomplete  Culture, blood (routine x 2)     Status: None (Preliminary result)   Collection Time: 01/22/15  9:26 AM  Result Value Ref Range Status   Specimen Description RIGHT ANTECUBITAL  Final   Special Requests IN PEDIATRIC BOTTLE 3CC  Final   Culture PENDING  Incomplete   Report Status PENDING  Incomplete     Labs: Basic Metabolic Panel:  Recent Labs Lab 01/20/15 1051  NA 139  K 3.6  CL 102  CO2 27  GLUCOSE 125*  BUN 17  CREATININE 0.85  CALCIUM 9.5   Liver Function Tests:  Recent Labs Lab 01/20/15 1051  AST 25  ALT 31  ALKPHOS 65   BILITOT 0.8  PROT 7.6  ALBUMIN 3.9    Recent Labs Lab  01/20/15 1051  LIPASE 15*   No results for input(s): AMMONIA in the last 168 hours. CBC:  Recent Labs Lab 01/20/15 1051  WBC 12.0*  NEUTROABS 9.4*  HGB 14.6  HCT 44.3  MCV 90.8  PLT 361   Cardiac Enzymes: No results for input(s): CKTOTAL, CKMB, CKMBINDEX, TROPONINI in the last 168 hours. BNP: BNP (last 3 results)  Recent Labs  11/27/14 1827 01/15/15 0037  BNP 38.8 36.3    ProBNP (last 3 results) No results for input(s): PROBNP in the last 8760 hours.  CBG:  Recent Labs Lab 01/22/15 1131 01/22/15 1629 01/22/15 2027 01/23/15 0721 01/23/15 1156  GLUCAP 102* 99 130* 103* 115*       Signed:  Ricketta Colantonio  [facility] 01/23/2015, 1:58 PM

## 2015-01-23 NOTE — Care Management Important Message (Signed)
Important Message  Patient Details  Name: [name] [name] MRN: [id number] Date of Birth: 1941-06-24   Medicare Important Message Given:  Yes-second notification given    Renie Ora 01/23/2015, 3:47 PMImportant Message  Patient Details  Name: [name] [name] MRN: [id number] Date of Birth: 06-Feb-1941   Medicare Important Message Given:  Yes-second notification given    Renie Ora 01/23/2015, 3:47 PM

## 2015-01-23 NOTE — Care Management Note (Signed)
Case Management Note  Patient Details  Name: Jayleena Claytor MRN: [id number] Date of Birth: May 13, 1941  Subjective/Objective:                 Urinary septicemia, plyeonephritis   Action/Plan: Date:  January 23, 2015 U.R. performed for needs and level of care. Will continue to follow for Case Management needs.  Marcelle Smiling, RN, BSN, [place]   [phone]  Expected Discharge Date:   (UNKNOWN)               Expected Discharge Plan:  Home/Self Care  In-House Referral:  NA  Discharge planning Services  CM Consult  Post Acute Care Choice:    Choice offered to:  NA  DME Arranged:  N/A DME Agency:  NA  HH Arranged:  NA HH Agency:  NA  Status of Service:  In process, will continue to follow  Medicare Important Message Given:    Date Medicare IM Given:    Medicare IM give by:    Date Additional Medicare IM Given:    Additional Medicare Important Message give by:     If discussed at Long Length of Stay Meetings, dates discussed:    Additional Comments:  Golda Acre, RN 01/23/2015, 11:29 AM

## 2015-01-23 NOTE — Progress Notes (Signed)
Physical Therapy Treatment Patient Details Name: [name] [name] MRN: [id number] DOB: [birth year] Today's Date: 01/23/2015    History of Present Illness 74 year old obese female adm with   nausea and vomiting with right lower abdominal and back pain-->pyelonephritis;  recent hospitalization for stroke, hypertension, hyperlipidemia, diabetes mellitus, MS; (recently discharged home from SNF--[facility])     PT Comments    Pt feeling better.  Assisted OOB to amb in hallway.  Tolerated session well.  Pt is use to using a walker since her stroke.   Progressing for.   Follow Up Recommendations  Home health PT (lives with daughter)     Equipment Recommendations  None recommended by PT    Recommendations for Other Services       Precautions / Restrictions Precautions Precautions: Fall    Mobility  Bed Mobility Overal bed mobility: Modified Independent             General bed mobility comments: increased time  Transfers Overall transfer level: Needs assistance Equipment used: Rolling walker (2 wheeled) Transfers: Sit to/from Stand Sit to Stand: Supervision         General transfer comment: cues for hand placement and overall safety, control of descent  Ambulation/Gait Ambulation/Gait assistance: Supervision;Min guard Ambulation Distance (Feet): 85 Feet Assistive device: Rolling walker (2 wheeled) Gait Pattern/deviations: Step-through pattern     General Gait Details: cues for posture, step length   Stairs Stairs:  (No stairs to enter house)          Wheelchair Mobility    Modified Rankin (Stroke Patients Only)       Balance                                    Cognition Arousal/Alertness: Awake/alert Behavior During Therapy: WFL for tasks assessed/performed;Flat affect Overall Cognitive Status: Within Functional Limits for tasks assessed                      Exercises      General Comments        Pertinent Vitals/Pain  Pain Assessment: No/denies pain    Home Living                      Prior Function            PT Goals (current goals can now be found in the care plan section) Progress towards PT goals: Progressing toward goals    Frequency  Min 3X/week    PT Plan      Co-evaluation             End of Session Equipment Utilized During Treatment: Gait belt Activity Tolerance: Patient tolerated treatment well Patient left: in chair;with call bell/phone within reach     Time: 0930-0955 PT Time Calculation (min) (ACUTE ONLY): 25 min  Charges:  $Gait Training: 8-22 mins $Therapeutic Activity: 8-22 mins                    G Codes:      Felecia Shelling  PTA [facility]  Acute  Rehab Pager      [phone]

## 2015-01-23 NOTE — Progress Notes (Signed)
ANTIBIOTIC CONSULT NOTE  Pharmacy Consult for Vancomycin Indication: 1/2 blood cx + GPCC  Allergies  Allergen Reactions  . Codeine Other (See Comments)    Real bad chest pains  . Lisinopril Cough  . Demerol Nausea Only  . Penicillins Rash and Other (See Comments)    Pt not sure "chestpains"    Patient Measurements: Height: 5\' 3"  (160 cm) Weight: 223 lb 1.7 oz (101.2 kg) IBW/kg (Calculated) : 52.4   Vital Signs: Temp: 97.8 F (36.6 C) (08/01 0506) Temp Source: Oral (08/01 0506) BP: 106/50 mmHg (08/01 0506) Pulse Rate: 60 (08/01 0506) Intake/Output from previous day: 07/31 0701 - 08/01 0700 In: 840 [P.O.:840] Out: -  Intake/Output from this shift:    Labs:  Recent Labs  01/20/15 1051  WBC 12.0*  HGB 14.6  PLT 361  CREATININE 0.85   Estimated Creatinine Clearance: 68.9 mL/min (by C-G formula based on Cr of 0.85).  Recent Labs  01/23/15 0745  VANCOTROUGH 15     Microbiology: Recent Results (from the past 720 hour(s))  Urine culture     Status: None   Collection Time: 01/15/15  1:49 AM  Result Value Ref Range Status   Specimen Description URINE, CLEAN CATCH  Final   Special Requests Normal  Final   Culture   Final    >=100,000 COLONIES/mL ESCHERICHIA COLI Confirmed Extended Spectrum Beta-Lactamase Producer (ESBL)    Report Status 01/17/2015 FINAL  Final   Organism ID, Bacteria ESCHERICHIA COLI  Final      Susceptibility   Escherichia coli - MIC*    AMPICILLIN >=32 RESISTANT Resistant     CEFAZOLIN >=64 RESISTANT Resistant     CEFTRIAXONE >=64 RESISTANT Resistant     CIPROFLOXACIN >=4 RESISTANT Resistant     GENTAMICIN <=1 SENSITIVE Sensitive     IMIPENEM <=0.25 SENSITIVE Sensitive     NITROFURANTOIN <=16 SENSITIVE Sensitive     TRIMETH/SULFA >=320 RESISTANT Resistant     AMPICILLIN/SULBACTAM >=32 RESISTANT Resistant     PIP/TAZO 8 SENSITIVE Sensitive     * >=100,000 COLONIES/mL ESCHERICHIA COLI  Blood culture (routine x 2)     Status: None  (Preliminary result)   Collection Time: 01/20/15 10:45 AM  Result Value Ref Range Status   Specimen Description BLOOD RIGHT HAND  Final   Special Requests BOTTLES DRAWN AEROBIC AND ANAEROBIC  Final   Culture  Setup Time   Final    GRAM POSITIVE COCCI IN CLUSTERS ANAEROBIC BOTTLE ONLY CRITICAL RESULT CALLED TO, READ BACK BY AND VERIFIED WITH: Jilda Roche [id number] [facility]    Culture   Final    GRAM POSITIVE COCCI Performed at [facility]    Report Status PENDING  Incomplete  Blood culture (routine x 2)     Status: None (Preliminary result)   Collection Time: 01/20/15 10:51 AM  Result Value Ref Range Status   Specimen Description BLOOD LEFT ARM  Final   Special Requests   Final    BOTTLES DRAWN AEROBIC AND ANAEROBIC AER ANA   Culture   Final    NO GROWTH 2 DAYS Performed at [facility]    Report Status PENDING  Incomplete  Urine culture     Status: None   Collection Time: 01/20/15 10:59 AM  Result Value Ref Range Status   Specimen Description URINE, CLEAN CATCH  Final   Special Requests NONE  Final   Culture   Final    40,000 COLONIES/ml ESCHERICHIA COLI Confirmed Extended  Spectrum Beta-Lactamase Producer (ESBL) Performed at [facility]    Report Status 01/22/2015 FINAL  Final   Organism ID, Bacteria ESCHERICHIA COLI  Final      Susceptibility   Escherichia coli - MIC*    AMPICILLIN >=32 RESISTANT Resistant     CEFAZOLIN >=64 RESISTANT Resistant     CEFTRIAXONE >=64 RESISTANT Resistant     CIPROFLOXACIN >=4 RESISTANT Resistant     GENTAMICIN <=1 SENSITIVE Sensitive     IMIPENEM <=0.25 SENSITIVE Sensitive     NITROFURANTOIN <=16 SENSITIVE Sensitive     TRIMETH/SULFA >=320 RESISTANT Resistant     AMPICILLIN/SULBACTAM >=32 RESISTANT Resistant     PIP/TAZO 8 SENSITIVE Sensitive     * 40,000 COLONIES/ml ESCHERICHIA COLI  Culture, blood (routine x 2)     Status: None (Preliminary result)   Collection Time: 01/22/15  9:12 AM  Result  Value Ref Range Status   Specimen Description BLOOD RIGHT HAND  Final   Special Requests BOTTLES DRAWN AEROBIC ONLY 10CC  Final   Culture PENDING  Incomplete   Report Status PENDING  Incomplete  Culture, blood (routine x 2)     Status: None (Preliminary result)   Collection Time: 01/22/15  9:26 AM  Result Value Ref Range Status   Specimen Description RIGHT ANTECUBITAL  Final   Special Requests IN PEDIATRIC BOTTLE 3CC  Final   Culture PENDING  Incomplete   Report Status PENDING  Incomplete    Medical History: Past Medical History  Diagnosis Date  . HTN (hypertension)   . Ascending aortic aneurysm   . Dyslipidemia   . Tobacco dependence   . Hypothyroidism   . Depression   . Neurogenic bladder   . Obesity   . Dog bite(E906.0)     RLL with systemic inflammatory response  . Chronic low back pain   . Anxiety   . MS (multiple sclerosis)     Followed by Dr. Sandria Manly  . Arthritis   . History of ulcer disease 30 yrs ago  . IBS (irritable bowel syndrome)   . Incontinence in female   . Obstructive sleep apnea     a. compliant with CPAP  . Diabetes mellitus without complication      Assessment: 27 y/oF with PMH of AAA, recent CVA, MS recently seen in ED on 7/24 and discharged with prescription for Cipro for UTI, changed to Macrobid on 7/27 per urine culture results of ESBL E.coli who presents to [facility] ED with persistent n/v and abdominal pain. Primaxin changed to fosfomycin today in anticipation of discharge.  Patient continues on Vancomycin pending final blood cx.  Patient is clinically improved.  Renal function stable.  Vancomycin trough at goal today.  [facility] MD aware of allergy to Penicillin and will monitor patient closely for reaction.  PTA Cipro 7/24-7/27 PTA Macrobid 7/27-7/28 (prescribed 7-day course) 7/29 >> Gentamicin x 1 7/29 >> Primaxin >> 8/1 8/1>>fosfomycin>>   7/24 urine: ESBL E.coli-S to Gentamicin, Imipenem, Nitrofurantoin, Zosyn 7/29 blood x 2: 1/2 + [facility] 7/29 urine:  40K colonies ESBL (same sens as above)  Dose changes/levels: 8/1 0730 VT = 15 on 750 IV q12  Goal of Therapy:  Vancomycin trough 15-20 mcg/ml Appropriate antibiotic dosing for renal function and indication Eradication of infection  Plan:   Continue Vancomycin  IV q12h  F/U final cx data  Recommend continue fosfomycin q3days to complete 10-14 day antibiotic course  Monitor renal function, cultures, clinical course.  Junita Push, PharmD, BCPS Pager: [phone] 01/23/2015@9 :27 AM

## 2015-01-23 NOTE — Progress Notes (Signed)
[facility] PROGRESS NOTE  [name] [name] ZOX:[id number] DOB: [birth year] DOA: 01/20/2015 PCP: Ezequiel Kayser, MD  brief narrative 74 year old obese female with a recent hospitalization for stroke, hypertension, hyperlipidemia and newly diagnosed diabetes mellitus, recently discharged home from skilled nursing facility was seen in the ED on 7/24 for nausea and vomiting with right lower abdominal and back pain. CT of the abdomen and pelvis done at that time was unremarkable except for CBG of 1.9 cm (patient is status post cholecystectomy). UA was positive for UTI and patient was discharged on ciprofloxacin. Since her symptoms did not improve and was persistently nauseous with several episode of vomiting for last 2 days with right lower quadrant abdominal pain and low back pain she returned to the ED. Urine culture done during her ED visit on 7/24 grew ESBL which was sensitive only to Zosyn, imipenem, gentamicin and nitrofurantoin. In the ED vitals were stable. She had mild leukocytosis. Patient admitted to hospital for persistent symptoms with resistant UTI and pyelonephritis.  Assessment/Plan: Principal problem Acute pyelonephritis with ESBL UTI -ESBL UTI was being treated with ciprofloxacin as outpatient followed by nitrofurantoin. Cultures  sensitive to Zosyn, gentamicin and imipenem.  - on empiric IV Primaxin. Patient reports history of rash with penicillin.  Has been tolerating Primaxin.  Will need a PICC line once repeat blood cultures clearing infection.  Discuss with ID on duration of antibiotics.  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  Active Problems:  GPC bacteremia 1/2 blood cx on admission growing GPC. Added empiric IV vancomycin. final culture results still pending. Will verify with lab. Repeat blood cx negative to date.   Recent CVA Patient hospitalized one month back with acute CVA with mild left upper extremity  residual weakness. 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 Patient on aspirin and Plavix for 3 months followed by Plavix alone. Continue statin. Started on metformin for new onset diabetes. Currently using a walker to ambulate. - PT evaluation recommends home with services versus skilled nursing facility..   Essential hypertension Blood pressure stable. Continue home medications.  Hypothyroidism Continue Synthroid  Obstructive sleep apnea Continue nighttime CPAP.  Obesity Needs counseling on weight loss and exercise.  New type 2 diabetes mellitus A1c of 6.6.Marland Kitchen  monitor on sliding insulin insulin  Aneurysmal dilatation of ascending aorta Seen on MRI during a recent hospitalization. Needs repeat CT scan angiogram or MRA in one year.  Bipolar depression Continue home medication.  Tobacco abuse Quit about 2 months back. Not using nicotine patch anymore.  History of multiple sclerosis Follows with neurology as outpatient. Reports history of urinary incontinence.  Diet: Diabetic/heart healthy  DVT prophylaxis: sq lovenox   Code Status: Full code Family Communication:None at bedside Disposition Plan: discharge possibly on 8/2 ( home vs SNF) once final abx course determined and PICC placed.   HPI/Subjective: Patient seen and examined. Reports occasional Rt mid to lower quadrant pain, but overall feels better.  Objective: Filed Vitals:   01/23/15 0506  BP: 106/50  Pulse: 60  Temp: 97.8 F (36.6 C)  Resp: 17    Intake/Output Summary (Last 24 hours) at 01/23/15 0819 Last data filed at 01/22/15 1910  Gross per 24 hour  Intake    840 ml  Output      0 ml  Net    840 ml   Filed Weights   01/20/15 1358 01/23/15 0506  Weight: 100.5 kg (221 lb 9 oz) 101.2 kg (223 lb 1.7 oz)    Exam:   General:  no acute distress  HEENT:  most oral mucosa  Chest: Clear to auscultation bilaterally  CVS: Normal S1  and S2, no murmurs  GI: Soft, nontender, nondistended, bowel sounds present  Skin: Warm, no edema   Data Reviewed: Basic Metabolic Panel:  Recent Labs Lab 01/20/15 1051  NA 139  K 3.6  CL 102  CO2 27  GLUCOSE 125*  BUN 17  CREATININE 0.85  CALCIUM 9.5   Liver Function Tests:  Recent Labs Lab 01/20/15 1051  AST 25  ALT 31  ALKPHOS 65  BILITOT 0.8  PROT 7.6  ALBUMIN 3.9    Recent Labs Lab 01/20/15 1051  LIPASE 15*   No results for input(s): AMMONIA in the last 168 hours. CBC:  Recent Labs Lab 01/20/15 1051  WBC 12.0*  NEUTROABS 9.4*  HGB 14.6  HCT 44.3  MCV 90.8  PLT 361   Cardiac Enzymes: No results for input(s): CKTOTAL, CKMB, CKMBINDEX, TROPONINI in the last 168 hours. BNP (last 3 results)  Recent Labs  11/27/14 1827 01/15/15 0037  BNP 38.8 36.3    ProBNP (last 3 results) No results for input(s): PROBNP in the last 8760 hours.  CBG:  Recent Labs Lab 01/22/15 0741 01/22/15 1131 01/22/15 1629 01/22/15 2027 01/23/15 0721  GLUCAP 137* 102* 99 130* 103*    Recent Results (from the past 240 hour(s))  Urine culture     Status: None   Collection Time: 01/15/15  1:49 AM  Result Value Ref Range Status   Specimen Description URINE, CLEAN CATCH  Final   Special Requests Normal  Final   Culture   Final    >=100,000 COLONIES/mL ESCHERICHIA COLI Confirmed Extended Spectrum Beta-Lactamase Producer (ESBL)    Report Status 01/17/2015 FINAL  Final   Organism ID, Bacteria ESCHERICHIA COLI  Final      Susceptibility   Escherichia coli - MIC*    AMPICILLIN >=32 RESISTANT Resistant     CEFAZOLIN >=64 RESISTANT Resistant     CEFTRIAXONE >=64 RESISTANT Resistant     CIPROFLOXACIN >=4 RESISTANT Resistant     GENTAMICIN <=1 SENSITIVE Sensitive     IMIPENEM <=0.25 SENSITIVE Sensitive     NITROFURANTOIN <=16 SENSITIVE Sensitive     TRIMETH/SULFA >=320 RESISTANT Resistant     AMPICILLIN/SULBACTAM >=32 RESISTANT Resistant     PIP/TAZO 8  SENSITIVE Sensitive     * >=100,000 COLONIES/mL ESCHERICHIA COLI  Blood culture (routine x 2)     Status: None (Preliminary result)   Collection Time: 01/20/15 10:45 AM  Result Value Ref Range Status   Specimen Description BLOOD RIGHT HAND  Final   Special Requests BOTTLES DRAWN AEROBIC AND ANAEROBIC  Final   Culture  Setup Time   Final    GRAM POSITIVE COCCI IN CLUSTERS ANAEROBIC BOTTLE ONLY CRITICAL RESULT CALLED TO, READ BACK BY AND VERIFIED WITH: Jilda Roche [phone] [facility]    Culture   Final    GRAM POSITIVE COCCI Performed at [facility]    Report Status PENDING  Incomplete  Blood culture (routine x 2)     Status: None (Preliminary result)   Collection Time: 01/20/15 10:51 AM  Result Value Ref Range Status   Specimen Description BLOOD LEFT ARM  Final   Special Requests   Final    BOTTLES DRAWN AEROBIC AND ANAEROBIC AER ANA   Culture  Final    NO GROWTH 2 DAYS Performed at [facility]    Report Status PENDING  Incomplete  Urine culture     Status: None   Collection Time: 01/20/15 10:59 AM  Result Value Ref Range Status   Specimen Description URINE, CLEAN CATCH  Final   Special Requests NONE  Final   Culture   Final    40,000 COLONIES/ml ESCHERICHIA COLI Confirmed Extended Spectrum Beta-Lactamase Producer (ESBL) Performed at [facility]    Report Status 01/22/2015 FINAL  Final   Organism ID, Bacteria ESCHERICHIA COLI  Final      Susceptibility   Escherichia coli - MIC*    AMPICILLIN >=32 RESISTANT Resistant     CEFAZOLIN >=64 RESISTANT Resistant     CEFTRIAXONE >=64 RESISTANT Resistant     CIPROFLOXACIN >=4 RESISTANT Resistant     GENTAMICIN <=1 SENSITIVE Sensitive     IMIPENEM <=0.25 SENSITIVE Sensitive     NITROFURANTOIN <=16 SENSITIVE Sensitive     TRIMETH/SULFA >=320 RESISTANT Resistant     AMPICILLIN/SULBACTAM >=32 RESISTANT Resistant     PIP/TAZO 8 SENSITIVE Sensitive     * 40,000 COLONIES/ml ESCHERICHIA COLI   Culture, blood (routine x 2)     Status: None (Preliminary result)   Collection Time: 01/22/15  9:12 AM  Result Value Ref Range Status   Specimen Description BLOOD RIGHT HAND  Final   Special Requests BOTTLES DRAWN AEROBIC ONLY 10CC  Final   Culture PENDING  Incomplete   Report Status PENDING  Incomplete  Culture, blood (routine x 2)     Status: None (Preliminary result)   Collection Time: 01/22/15  9:26 AM  Result Value Ref Range Status   Specimen Description RIGHT ANTECUBITAL  Final   Special Requests IN PEDIATRIC BOTTLE 3CC  Final   Culture PENDING  Incomplete   Report Status PENDING  Incomplete     Studies: No results found.  Scheduled Meds: . amLODipine  5 mg Oral Daily  . aspirin EC  81 mg Oral Daily  . atorvastatin  20 mg Oral Daily  . buPROPion  300 mg Oral Daily  . cholecalciferol  4,000 Units Oral BID  . clonazePAM  1 mg Oral QHS  . clopidogrel  75 mg Oral Daily  . enoxaparin (LOVENOX) injection  50 mg Subcutaneous Q24H  . FLUoxetine  40 mg Oral Daily  . gabapentin  100 mg Oral 3 times per day  . imipenem-cilastatin  500 mg Intravenous 3 times per day  . insulin aspart  0-15 Units Subcutaneous TID WC  . insulin aspart  0-5 Units Subcutaneous QHS  . levothyroxine  200 mcg Oral QAC breakfast  . losartan  100 mg Oral Daily  . pantoprazole  40 mg Oral BID  . Umeclidinium-Vilanterol  1 puff Inhalation Daily  . vancomycin  750 mg Intravenous BID   Continuous Infusions: . sodium chloride 75 mL/hr at 01/23/15 0513      Time spent: 25 minutes    Lagena Strand  [facility] Pager [phone]. If 7PM-7AM, please contact night-coverage at [email or web address], password [facility] 01/23/2015, 8:19 AM  LOS: 3 days

## 2015-01-25 ENCOUNTER — Encounter: Payer: Self-pay | Admitting: Internal Medicine

## 2015-01-25 ENCOUNTER — Ambulatory Visit (INDEPENDENT_AMBULATORY_CARE_PROVIDER_SITE_OTHER): Payer: Medicare Other | Admitting: Internal Medicine

## 2015-01-25 VITALS — BP 122/84 | HR 88 | Ht 63.0 in | Wt 227.0 lb

## 2015-01-25 DIAGNOSIS — K838 Other specified diseases of biliary tract: Secondary | ICD-10-CM | POA: Diagnosis not present

## 2015-01-25 DIAGNOSIS — K7689 Other specified diseases of liver: Secondary | ICD-10-CM | POA: Diagnosis not present

## 2015-01-25 DIAGNOSIS — R945 Abnormal results of liver function studies: Secondary | ICD-10-CM

## 2015-01-25 LAB — CULTURE, BLOOD (ROUTINE X 2): CULTURE: NO GROWTH

## 2015-01-25 MED ORDER — PROMETHAZINE HCL 12.5 MG PO TABS: 12.5000 mg | ORAL_TABLET | Freq: Four times a day (QID) | ORAL | Status: AC | PRN

## 2015-01-25 NOTE — Progress Notes (Signed)
Rhapsody Wolven 1941-04-28 [id number]  Note: This dictation was prepared with Dragon digital system. Any transcriptional errors that result from this procedure are unintentional.   History of Present Illness: This is a 74 year old white female presents with abnormal liver function tests. Multiple hospitalizations, most recently for acute pyelonephritis from 7/29 to  01/23/2015. Prior to that for nausea vomiting. She describes episodes of epigastric pain, nausea and vomiting. In November 2015 AST 159, ALT 125 with a total bilirubin of 1.9 an alkaline phosphatase of 138. In 11/27/2014 AST 188, ALT 162, alkaline phosphatase of 162,  on December 16, 2014  AST 123, ALT 81. Alkaline phosphatase 71. On 01/20/2015 AST 25, ALT 31. Alk. phosph  65, serum albumin 3.9. CT scan of the abdomen and pelvis on 01/15/2015 showed status post cholecystectomy with prominent intrahepatic ducts and common bile duct of 19 mm. There was some calcification in the abdominal aorta. Ultrasound in 2015 confirmed 10 mm common bile duct and fatty liver. Patient's attacks of nausea and vomiting resample biliary colic. She underwent the emergency cholecystectomy 40 years ago in [place] and had the common bile duct exploration and T-tube for 2 weeks after the surgery. She denies having jaundice . She has multiple other medical problems such as obesity, diabetes, ascending aortic aneurysm. Arthritis with difficult ambulation and obstructive sleep apnea. She does not drink alcohol    Past Medical History  Diagnosis Date  . HTN (hypertension)   . Ascending aortic aneurysm   . Dyslipidemia   . Tobacco dependence   . Hypothyroidism   . Depression   . Neurogenic bladder   . Obesity   . Dog bite(E906.0)     RLL with systemic inflammatory response  . Chronic low back pain   . Anxiety   . MS (multiple sclerosis)     Followed by Dr. Erling Cruz  . Arthritis   . History of ulcer disease 30 yrs ago  . IBS (irritable bowel syndrome)   .  Incontinence in female   . Obstructive sleep apnea     a. compliant with CPAP  . Diabetes mellitus without complication     Past Surgical History  Procedure Laterality Date  . Cholecystectomy    . Hysterectomy    . Nasal sinus surgery    . Left eye surgery    . Bilateral foot surgery    . Leg surgery  2006    due to fx leg   . Back surgery    . Total knee arthroplasty Left 04/11/2014    Procedure: LEFT TOTAL KNEE ARTHROPLASTY;  Surgeon: Mauri Pole, MD;  Location: [facility] ORS;  Service: Orthopedics;  Laterality: Left;  . Tee without cardioversion N/A 12/21/2014    Procedure: TRANSESOPHAGEAL ECHOCARDIOGRAM (TEE);  Surgeon: Sanda Klein, MD;  Location: [facility] ENDOSCOPY;  Service: Cardiovascular;  Laterality: N/A;    Allergies  Allergen Reactions  . Codeine Other (See Comments)    Real bad chest pains  . Lisinopril Cough  . Demerol Nausea Only  . Penicillins Rash and Other (See Comments)    Pt not sure "chestpains"    Family history and social history have been reviewed.  Review of Systems: Nausea and vomiting. Epigastric abdominal pain  The remainder of the 10 point ROS is negative except as outlined in the H&P  Physical Exam: General Appearance Well developed, in no distress, overweight. Ambulates with walker. Very difficult to position on examining table Eyes  Non icteric  HEENT  Non traumatic, normocephalic  Mouth No lesion,  tongue papillated, no cheilosis Neck Supple without adenopathy, thyroid not enlarged, no carotid bruits, no JVD Lungs Clear to auscultation bilaterally COR Normal S1, normal S2, regular rhythm, no murmur, quiet precordium Abdomen protuberant obese nontender with normoactive bowel sounds. Liver edge at costal margin Rectal soft Hemoccult-negative stool Extremities  1+ pedal edema Skin No lesions Neurological Alert and oriented x 3 Psychological Normal mood and affect  Assessment and Plan:   74 year old white female with the intermittent elevation  of liver function tests of at least 2 years duration and past history of  open cholecystectomy with common bile duct exploration in [place] 40 years ago. She is having intermittent episodes of abdominal pain. I'm suspicious for  choledocholithiasis. Her intrahepatic bile ducts and extrahepatic ducts are dilated. We will proceed with MRCP to look for retained stone or common bile duct stricture., doubt ampullary Ca. Currently her liver function tests are normal. We will prescribe Phenergan 12.5 mg when necessary nausea. I have advised strict low-fat diet. Depending on MRCP she may need further evaluation with EUS/ERCP.  Fatty liver. Obesity and diabetes.    Delfin Edis 01/25/2015

## 2015-01-25 NOTE — Patient Instructions (Addendum)
Dr Waynard Edwards  We have sent  medications to your pharmacy for you to pick up at your convenience.  You have been scheduled for an MRI/MRCP at [facility] on 02/06/15. Your appointment time is 130 pm. Please arrive 15 minutes prior to your appointment time for registration purposes. Please make certain not to have anything to eat or drink 6 hours prior to your test. In addition, if you have any metal in your body, have a pacemaker or defibrillator, please be sure to let your ordering physician know. This test typically takes 45 minutes to 1 hour to complete.  [address] [place]

## 2015-01-27 LAB — CULTURE, BLOOD (ROUTINE X 2)
CULTURE: NO GROWTH
Culture: NO GROWTH

## 2015-02-06 ENCOUNTER — Ambulatory Visit
Admission: RE | Admit: 2015-02-06 | Discharge: 2015-02-06 | Disposition: A | Discharge status: Home / Self Care | Payer: Medicare Other | Source: Ambulatory Visit | Attending: Internal Medicine | Admitting: Internal Medicine

## 2015-02-06 DIAGNOSIS — K838 Other specified diseases of biliary tract: Secondary | ICD-10-CM

## 2015-02-06 MED ORDER — GADOBENATE DIMEGLUMINE 529 MG/ML IV SOLN
20.0000 mL | Freq: Once | INTRAVENOUS | Status: AC | PRN
  Administered 2015-02-06: 20 mL via INTRAVENOUS

## 2015-02-18 ENCOUNTER — Other Ambulatory Visit: Payer: Self-pay | Admitting: Nurse Practitioner

## 2015-02-20 ENCOUNTER — Ambulatory Visit: Payer: Self-pay | Admitting: Neurology

## 2015-02-22 ENCOUNTER — Encounter: Payer: Self-pay | Admitting: Neurology

## 2015-02-22 ENCOUNTER — Ambulatory Visit (INDEPENDENT_AMBULATORY_CARE_PROVIDER_SITE_OTHER): Payer: Medicare Other | Admitting: Neurology

## 2015-02-22 VITALS — BP 140/96 | HR 96 | Ht 63.0 in | Wt 228.0 lb

## 2015-02-22 DIAGNOSIS — I6340 Cerebral infarction due to embolism of unspecified cerebral artery: Secondary | ICD-10-CM | POA: Diagnosis not present

## 2015-02-22 DIAGNOSIS — I639 Cerebral infarction, unspecified: Secondary | ICD-10-CM

## 2015-02-22 NOTE — Patient Instructions (Signed)
I had a long d/w patient about his recent stroke, risk for recurrent stroke/TIAs, personally independently reviewed imaging studies and stroke evaluation results and answered questions.Continue aspirin 81 mg orally every day and clopidogrel 75 mg orally every day  for secondary stroke prevention and maintain strict control of hypertension with blood pressure goal below 130/90, diabetes with hemoglobin A1c goal below 6.5% and lipids with LDL cholesterol goal below 100 mg/dL. I also advised the patient to eat a healthy diet with plenty of whole grains, cereals, fruits and vegetables, exercise regularly and maintain ideal body weight outpatient referral for physical and occupational therapy. I also recommend she have outpatient follow-up with cardiology to follow the aortic arch thrombus and if it is resolved discontinue aspirin and stay on Plavix alone. Greater than 50% time during this 25 minute visit was spent on counseling and coordination of care. Followup in the future with me in  6 months or call earlier if necessary. Stroke Prevention Some medical conditions and behaviors are associated with an increased chance of having a stroke. You may prevent a stroke by making healthy choices and managing medical conditions. HOW CAN I REDUCE MY RISK OF HAVING A STROKE?   Stay physically active. Get at least 30 minutes of activity on most or all days.  Do not smoke. It may also be helpful to avoid exposure to secondhand smoke.  Limit alcohol use. Moderate alcohol use is considered to be:  No more than 2 drinks per day for men.  No more than 1 drink per day for nonpregnant women.  Eat healthy foods. This involves:  Eating 5 or more servings of fruits and vegetables a day.  Making dietary changes that address high blood pressure (hypertension), high cholesterol, diabetes, or obesity.  Manage your cholesterol levels.  Making food choices that are high in fiber and low in saturated fat, trans fat, and  cholesterol may control cholesterol levels.  Take any prescribed medicines to control cholesterol as directed by your health care provider.  Manage your diabetes.  Controlling your carbohydrate and sugar intake is recommended to manage diabetes.  Take any prescribed medicines to control diabetes as directed by your health care provider.  Control your hypertension.  Making food choices that are low in salt (sodium), saturated fat, trans fat, and cholesterol is recommended to manage hypertension.  Take any prescribed medicines to control hypertension as directed by your health care provider.  Maintain a healthy weight.  Reducing calorie intake and making food choices that are low in sodium, saturated fat, trans fat, and cholesterol are recommended to manage weight.  Stop drug abuse.  Avoid taking birth control pills.  Talk to your health care provider about the risks of taking birth control pills if you are over 60 years old, smoke, get migraines, or have ever had a blood clot.  Get evaluated for sleep disorders (sleep apnea).  Talk to your health care provider about getting a sleep evaluation if you snore a lot or have excessive sleepiness.  Take medicines only as directed by your health care provider.  For some people, aspirin or blood thinners (anticoagulants) are helpful in reducing the risk of forming abnormal blood clots that can lead to stroke. If you have the irregular heart rhythm of atrial fibrillation, you should be on a blood thinner unless there is a good reason you cannot take them.  Understand all your medicine instructions.  Make sure that other conditions (such as anemia or atherosclerosis) are addressed. SEEK IMMEDIATE MEDICAL  CARE IF:   You have sudden weakness or numbness of the face, arm, or leg, especially on one side of the body.  Your face or eyelid droops to one side.  You have sudden confusion.  You have trouble speaking (aphasia) or  understanding.  You have sudden trouble seeing in one or both eyes.  You have sudden trouble walking.  You have dizziness.  You have a loss of balance or coordination.  You have a sudden, severe headache with no known cause.  You have new chest pain or an irregular heartbeat. Any of these symptoms may represent a serious problem that is an emergency. Do not wait to see if the symptoms will go away. Get medical help at once. Call your local emergency services (911 in [place] Do not drive yourself to the hospital. Document Released: 07/18/2004 Document Revised: 10/25/2013 Document Reviewed: 12/11/2012 [facility] Patient Information 2015 [place], [place]. This information is not intended to replace advice given to you by your health care provider. Make sure you discuss any questions you have with your health care provider.

## 2015-02-23 NOTE — Progress Notes (Signed)
[facility] [address] [place]. [place] [address] [phone]       OFFICE FOLLOW-UP NOTE  [name] Date of Birth:  16-Mar-1941 Medical Record Number:  [id number]   HPI: 46 year lady seen today for f/u after [facility] admission for stroke in June 2016.[name] [name] is an 74 y.o. female with a history of HTN, Aortic Aneurysm, Dyslipidemia, and Hypothyroid who presented to the ED with complaints of sharp intermittent Left sided chest pain and recurrent syncopal episodes. MRI brain completed and imaging reviewed. showed patchy R MCA infarcts. She is not on a daily antiplatelet at home. Patient denied any focal deficits, main concern is generalized fatigue.  Date last known well: 12/15/2014 Time last known well: unclear tPA Given: no, outside window . Hemoglobin A1c was 6.6. LDL cholesterol 96 mg percent. Carotid Doppler showed no significant extracranial stenosis. TEE showed 10 x 10 mm highly mobile echodense mass attached to the aortic wall at the level of the sinotubular junction which was likely a thrombus and atheromatous plaque. Cardiac MRI was done to confirm this and it was found to be a thrombus. Patient was started on aspirin and Plavix and was seen by physical occupational therapy. Patient was not felt to be a good candidate for inpatient rehabilitation and hence was transferred to skilled nursing facility for slower paced rehabilitation and has made gradual improvement there. She is being discharged home and can walk with a cane. She has not had any outpatient cardiac follow-up to repeat echocardiogram to see if the thrombosis resolved. She is tolerating her current antiplatelets agents without significant bleeding or bruising.  ROS:   14 system review of systems is positive for decreased stamina, tiredness, weakness and all other systems negative  PMH:  Past Medical History  Diagnosis Date  . HTN (hypertension)   . Ascending aortic aneurysm   . Dyslipidemia     . Tobacco dependence   . Hypothyroidism   . Depression   . Neurogenic bladder   . Obesity   . Dog bite(E906.0)     RLL with systemic inflammatory response  . Chronic low back pain   . Anxiety   . MS (multiple sclerosis)     Followed by Dr. Sandria Manly  . Arthritis   . History of ulcer disease 30 yrs ago  . IBS (irritable bowel syndrome)   . Incontinence in female   . Obstructive sleep apnea     a. compliant with CPAP  . Diabetes mellitus without complication   . Stroke     Social History:  Social History   Social History  . Marital Status: Widowed    Spouse Name: N/A  . Number of Children: 2  . Years of Education: N/A   Occupational History  . Retired Advertising account planner    Social History Main Topics  . Smoking status: Former Smoker -- 0.50 packs/day for 30 years    Types: Cigarettes    Quit date: 12/15/2014  . Smokeless tobacco: Never Used  . Alcohol Use: 0.0 oz/week    0 Standard drinks or equivalent per week     Comment: Rare alcohol use  . Drug Use: No  . Sexual Activity: Not on file   Other Topics Concern  . Not on file   Social History Narrative    Medications:   Current Outpatient Prescriptions on File Prior to Visit  Medication Sig Dispense Refill  . acetaminophen (TYLENOL) 325 MG tablet Take 2 tablets (650 mg total) by mouth every  6 (six) hours as needed for mild pain (or Fever >/= 101).    Marland Kitchen aspirin EC 81 MG EC tablet Take 1 tablet (81 mg total) by mouth daily. For 3 months. And then Plavix alone.    Marland Kitchen atorvastatin (LIPITOR) 20 MG tablet Take 20 mg by mouth daily.    . baclofen (LIORESAL) 10 MG tablet Take 10 mg by mouth 2 (two) times daily as needed for muscle spasms.    Marland Kitchen buPROPion (WELLBUTRIN XL) 300 MG 24 hr tablet Take 300 mg by mouth daily.     . Cholecalciferol (VITAMIN D) 2000 UNITS tablet Take 4,000 Units by mouth 2 (two) times daily.     . clonazePAM (KLONOPIN) 1 MG tablet Take 1 tablet (1 mg total) by mouth at bedtime. 30 tablet 0  . clopidogrel  (PLAVIX) 75 MG tablet Take 1 tablet (75 mg total) by mouth daily.    . diphenoxylate-atropine (LOMOTIL) 2.5-0.025 MG per tablet Take 2 tablets by mouth 4 (four) times daily as needed for diarrhea or loose stools. 30 tablet 0  . FLUoxetine (PROZAC) 40 MG capsule Take 40 mg by mouth every morning.     . gabapentin (NEURONTIN) 100 MG capsule Take 1 capsule (100 mg total) by mouth every 8 (eight) hours.    Marland Kitchen levothyroxine (SYNTHROID, LEVOTHROID) 200 MCG tablet Take 200 mcg by mouth daily before breakfast.    . losartan (COZAAR) 100 MG tablet Take 100 mg by mouth every morning.     . metFORMIN (GLUCOPHAGE) 500 MG tablet Take 1 tablet (500 mg total) by mouth 2 (two) times daily with a meal.    . ondansetron (ZOFRAN ODT) 4 MG disintegrating tablet Take 1 tablet (4 mg total) by mouth every 8 (eight) hours as needed for nausea or vomiting. 20 tablet 0  . oxyCODONE (OXY IR/ROXICODONE) 5 MG immediate release tablet Take 0.5 tablets (2.5 mg total) by mouth every 4 (four) hours as needed for moderate pain. 60 tablet 0  . pantoprazole (PROTONIX) 40 MG tablet Take 40 mg by mouth 2 (two) times daily.    . promethazine (PHENERGAN) 12.5 MG tablet Take 1 tablet (12.5 mg total) by mouth every 6 (six) hours as needed for nausea or vomiting. 20 tablet 1  . Umeclidinium-Vilanterol (ANORO ELLIPTA) 62.5-25 MCG/INH AEPB Inhale 1 application into the lungs daily.      No current facility-administered medications on file prior to visit.    Allergies:   Allergies  Allergen Reactions  . Codeine Other (See Comments)    Real bad chest pains  . Lisinopril Cough  . Demerol Nausea Only  . Penicillins Rash and Other (See Comments)    Pt not sure "chestpains"    Physical Exam General: well developed, well nourished, elderly lady  seated, in no evident distress Head: head normocephalic and atraumatic.  Neck: supple with no carotid or supraclavicular bruits Cardiovascular: regular rate and rhythm, no  murmurs Musculoskeletal: no deformity Skin:  no rash/petichiae Vascular:  Normal pulses all extremities Filed Vitals:   02/22/15 1407  BP: 140/96  Pulse: 96   Neurologic Exam Mental Status: Awake and fully alert. Oriented to place and time. Recent and remote memory intact. Attention span, concentration and fund of knowledge appropriate. Mood and affect appropriate.  Cranial Nerves: Fundoscopic exam reveals sharp disc margins. Pupils equal, briskly reactive to light. Extraocular movements full without nystagmus. Visual fields full to confrontation. Hearing intact. Facial sensation intact. Face, tongue, palate moves normally and symmetrically.  Motor: Normal bulk  and tone. Normal strength in all tested extremity muscles. Fine finger movements are diminished on the left. Orbits right over left approximately. Sensory.: intact to touch ,pinprick .position and vibratory sensation.  Coordination: Rapid alternating movements normal in all extremities. Finger-to-nose and heel-to-shin performed accurately bilaterally. Gait and Station: Arises from chair without difficulty. Stance is normal. Gait demonstrates normal stride length and balance . Able to heel, toe and tandem walk without difficulty.  Reflexes: 1+ and symmetric. Toes downgoing.   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 I also advised the patient to eat a healthy diet with plenty of whole grains, cereals, fruits and vegetables, exercise regularly and maintain ideal body weight outpatient referral for physical and occupational therapy. I also recommend she have outpatient follow-up with cardiology to follow the aortic arch thrombus and if it is resolved discontinue aspirin and stay on Plavix alone. Greater than 50% time during this 25 minute visit was spent on counseling and coordination of care. Followup in the future with me in  6 months or call earlier if necessary.  Delia Heady, MD Note: This document was prepared with digital dictation and possible smart phrase technology. Any transcriptional errors that result from this process are unintentional

## 2015-02-27 ENCOUNTER — Other Ambulatory Visit: Payer: Self-pay | Admitting: Nurse Practitioner

## 2015-02-28 ENCOUNTER — Ambulatory Visit: Payer: Medicare Other | Admitting: Internal Medicine

## 2015-08-23 ENCOUNTER — Ambulatory Visit: Payer: Medicare Other | Admitting: Neurology

## 2015-08-24 ENCOUNTER — Encounter: Payer: Self-pay | Admitting: Neurology

## 2017-04-03 IMAGING — CT CT HEAD W/O CM
2 series · 17 of 30 positions shown, 20 images · non-contrast
Comparison: 05/17/2014

CLINICAL DATA: Status post fall, generalized weakness

EXAM:
CT HEAD WITHOUT CONTRAST
TECHNIQUE: Contiguous axial images were obtained from the base of the skull
through the vertex without intravenous contrast.

[Series 2: head w/o · axial · non-contrast · 0.41mm/px · z∈[+611,+741]mm · 9 of 34 slices shown, 12 images]
[im 4/34  brain]
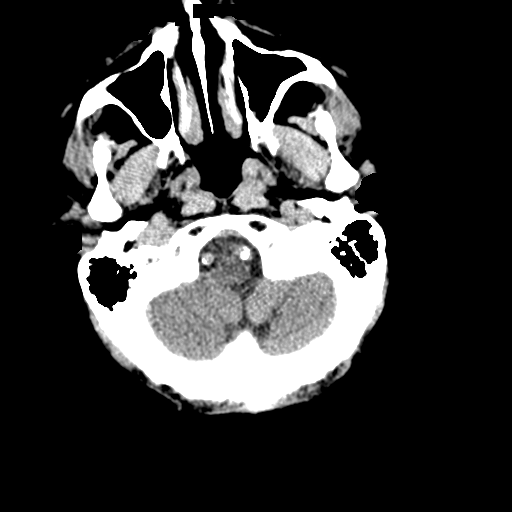
[im 4/34  bone]
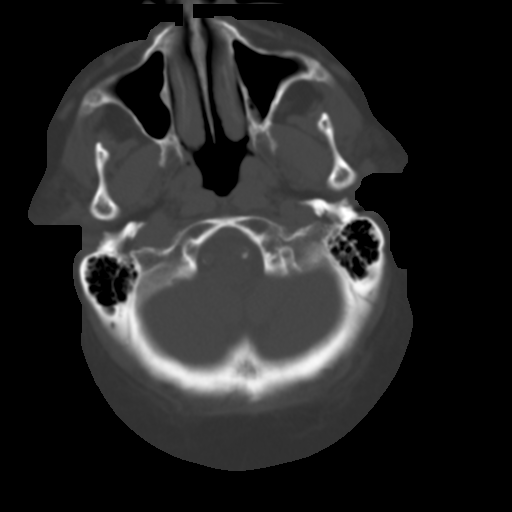
[im 7/34  brain]
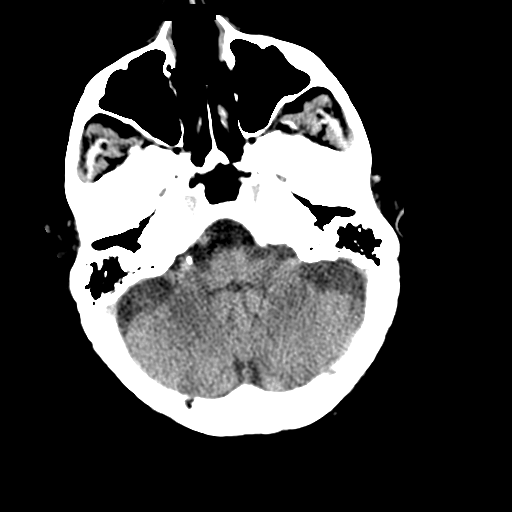
[im 10/34  brain]
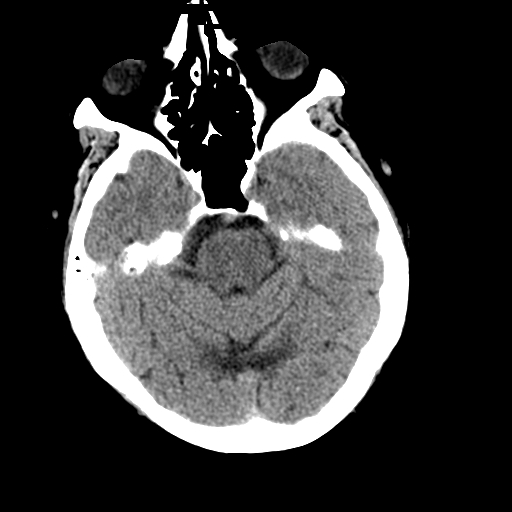
[im 14/34  brain]
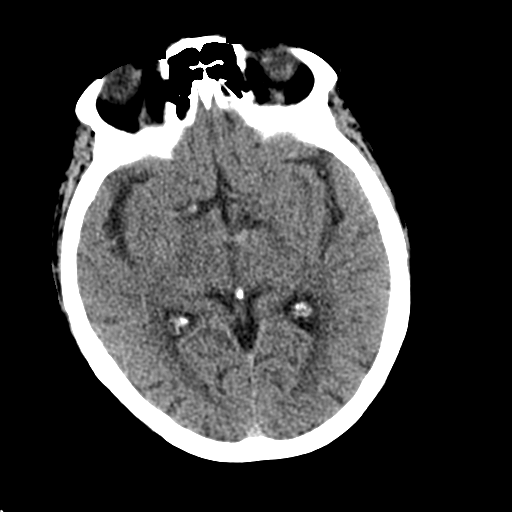
[im 17/34  brain]
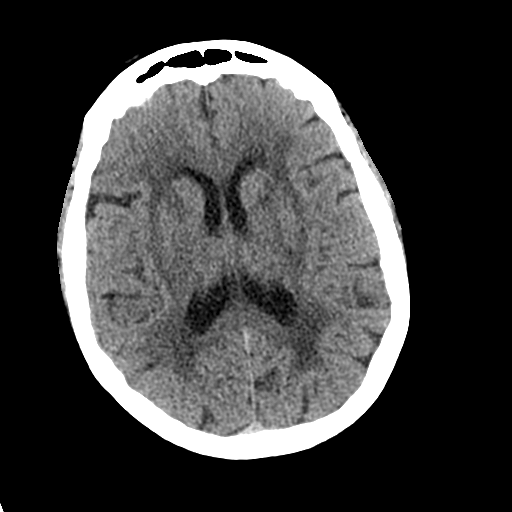
[im 17/34  bone]
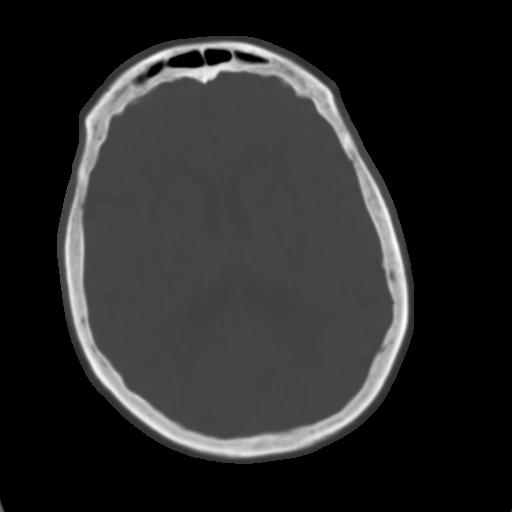
[im 20/34  brain]
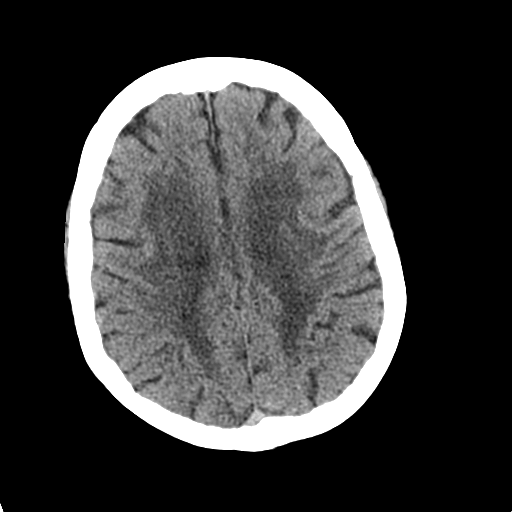
[im 24/34  brain]
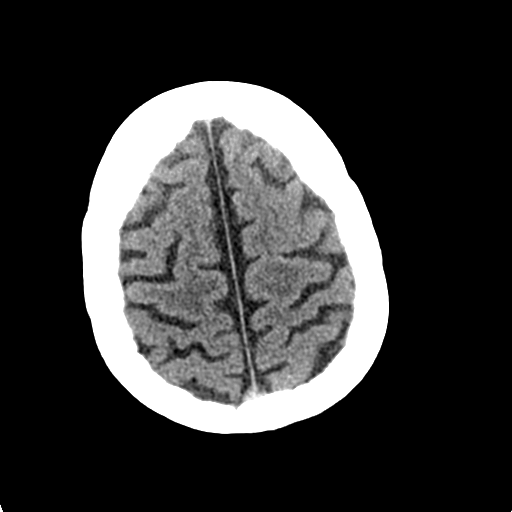
[im 27/34  brain]
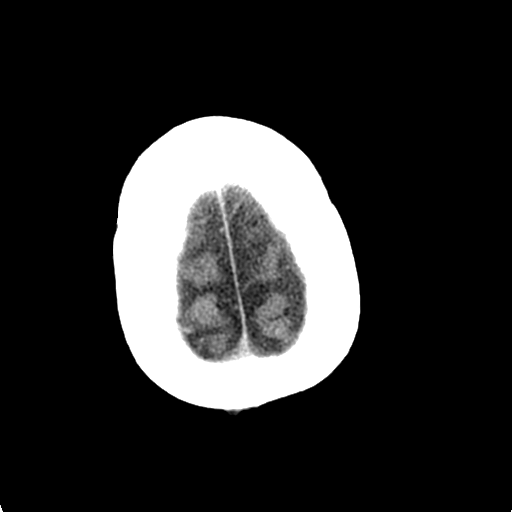
[im 30/34  brain]
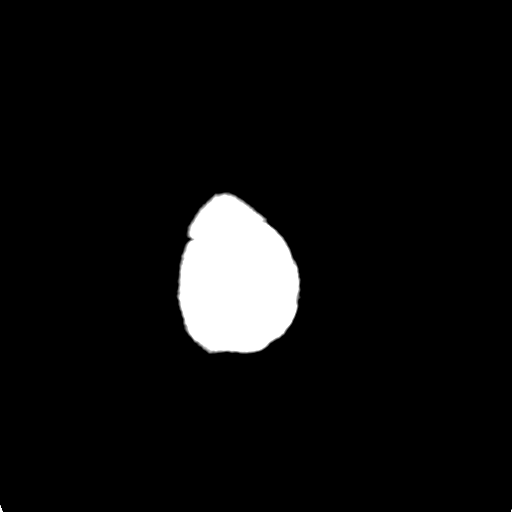
[im 30/34  bone]
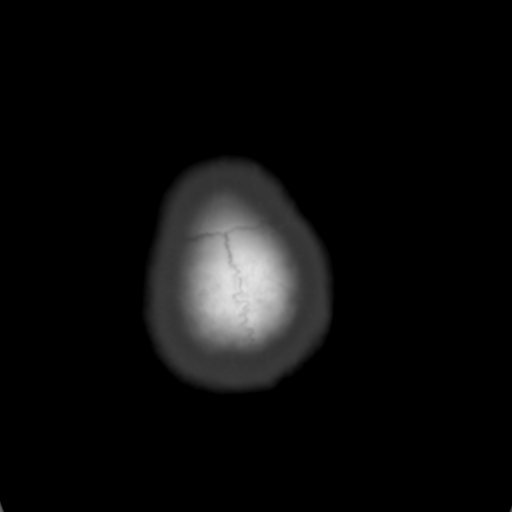

[Series 3: bone windows · axial · 0.41mm/px · z∈[+614,+740]mm · 8 of 56 slices shown]
[im 7/56  bone]
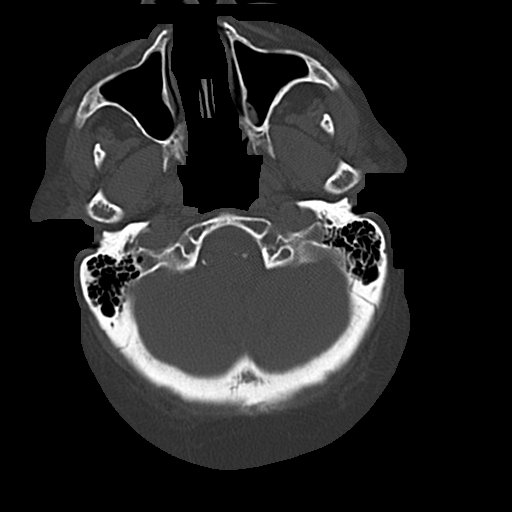
[im 13/56  bone]
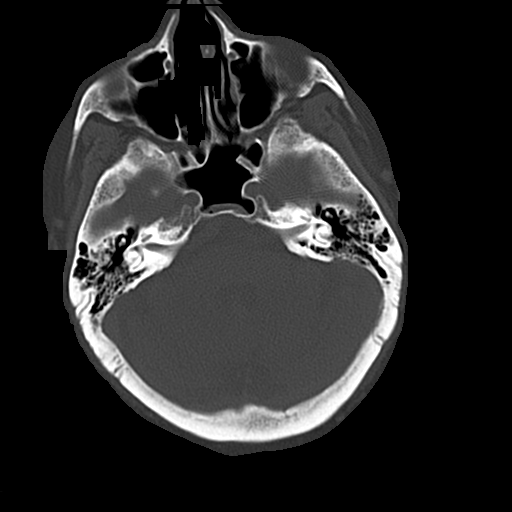
[im 19/56  bone]
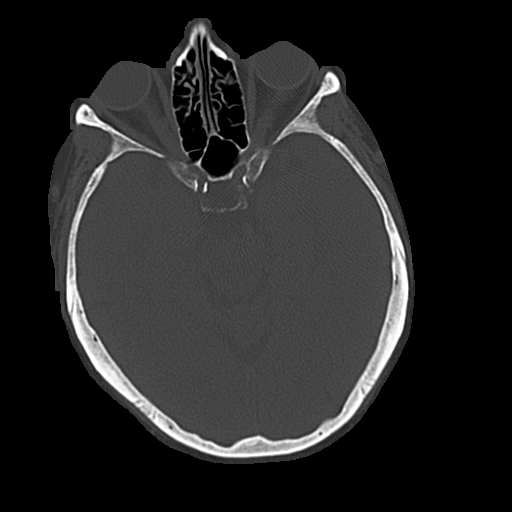
[im 25/56  bone]
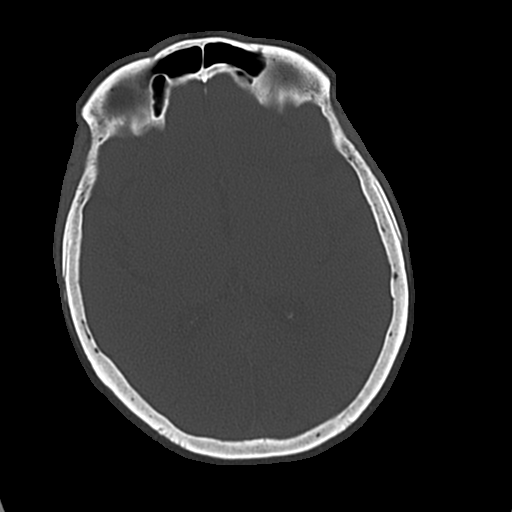
[im 31/56  bone]
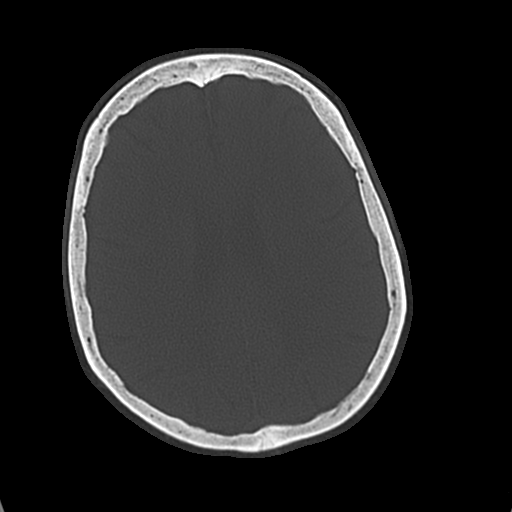
[im 37/56  bone]
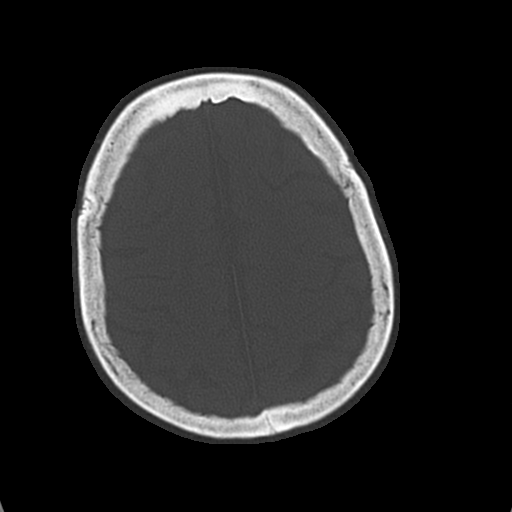
[im 43/56  bone]
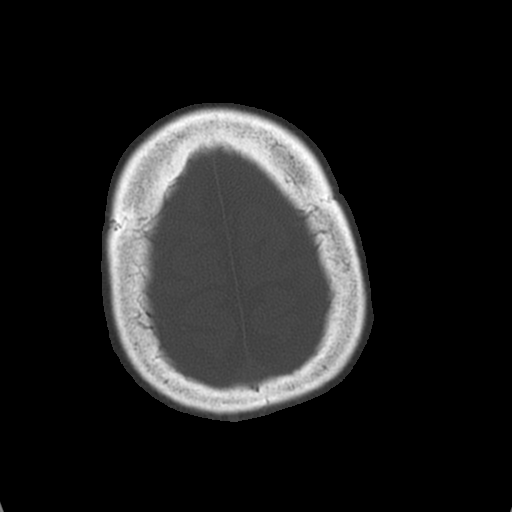
[im 49/56  bone]
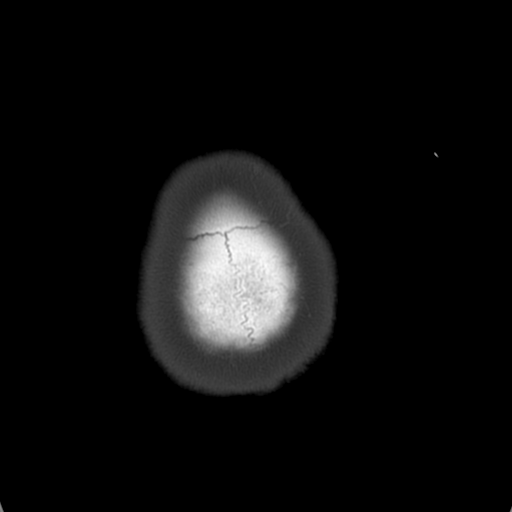

[17 of 30 positions shown; findings below may reference images not displayed]

FINDINGS: No evidence of parenchymal hemorrhage or extra-axial fluid
collection. No mass lesion, mass effect, or midline shift.

No CT evidence of acute infarction.

Subcortical white matter and periventricular small vessel ischemic
changes. Mild intracranial atherosclerosis.

Cerebral volume is within normal limits.  No ventriculomegaly.

The visualized paranasal sinuses are essentially clear. The mastoid
air cells are unopacified.

No evidence of calvarial fracture.
IMPRESSION: No evidence of acute intracranial abnormality.

Small vessel ischemic changes.

## 2017-04-05 IMAGING — MR MR MRA HEAD W/O CM
7 series · 35 of 48 positions shown · non-contrast
Comparison: Head CT 12/16/2014 and MRI 09/18/2010

CLINICAL DATA: Recent fall at home without head injury. No reported
loss of consciousness.

EXAM:
MRI HEAD WITHOUT CONTRAST
MRA HEAD WITHOUT CONTRAST
TECHNIQUE: Multiplanar, multiecho pulse sequences of the brain and surrounding
structures were obtained without intravenous contrast. Angiographic
images of the head were obtained using MRA technique without
contrast.

[Series 3: DWI · axial · 3.0mm · 1.09mm/px · z∈[-86,+43]mm · 11 of 92 slices shown (1 of 4)]
[im 1/92]
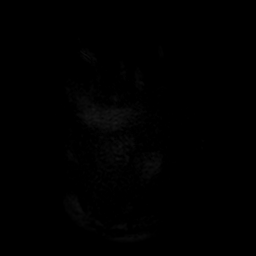
[im 10/92]
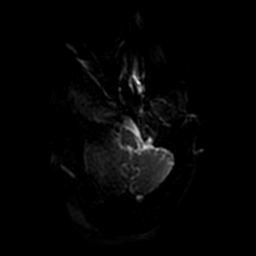
[im 19/92]
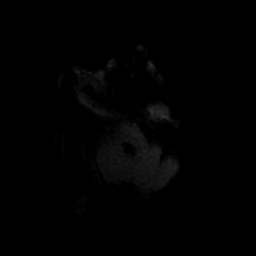
[im 28/92]
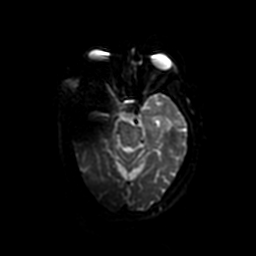
[im 37/92]
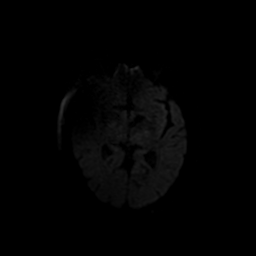
[im 46/92]
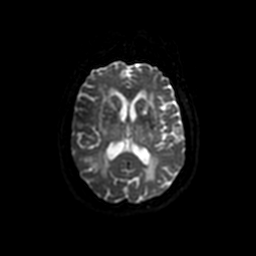
[im 55/92]
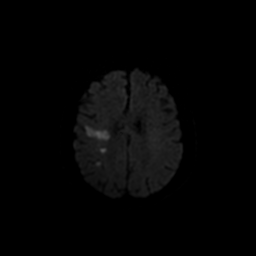
[im 64/92]
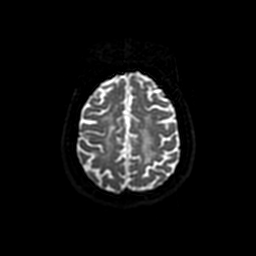
[im 73/92]
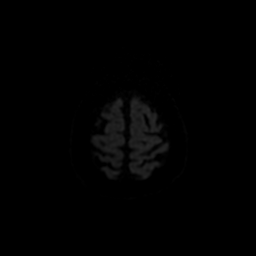
[im 82/92]
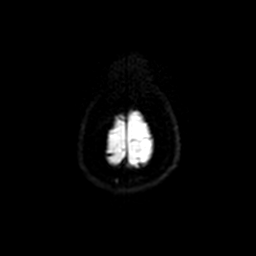
[im 92/92]
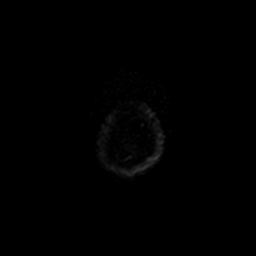

[Series 4: T1 · sagittal · 5.0mm · 0.47mm/px · 3 of 24 slices shown]
[im 1/24]
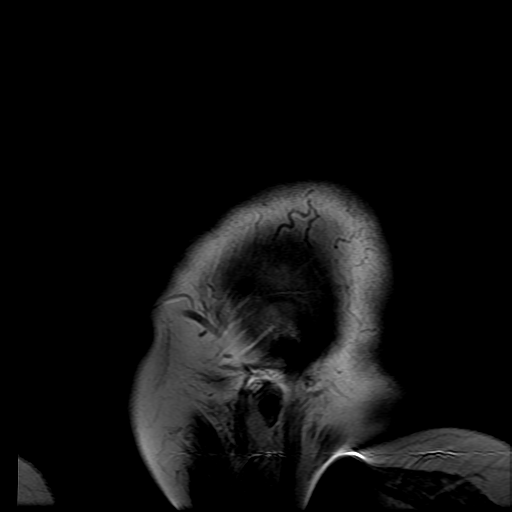
[im 12/24]
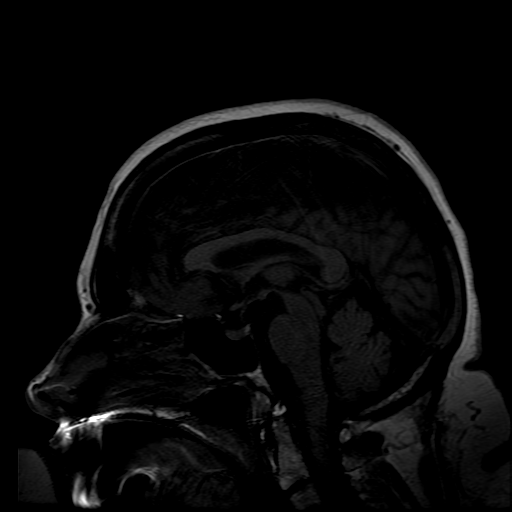
[im 24/24]
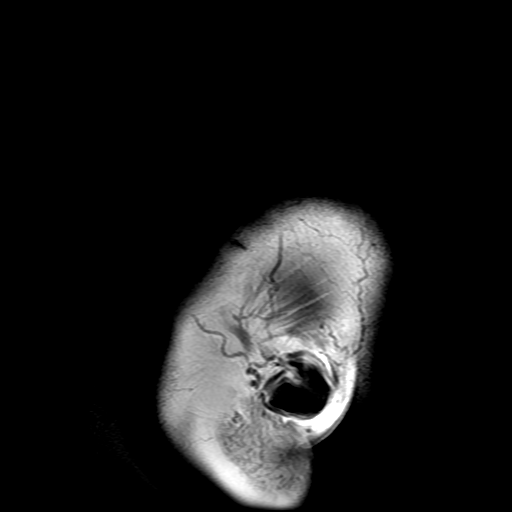

[Series 5: DWI · coronal · 5.0mm · 1.09mm/px · 6 of 58 slices shown (2 of 4)]
[im 1/58]
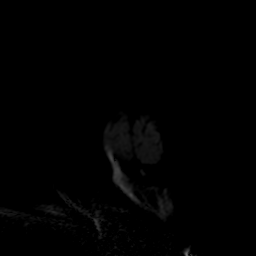
[im 12/58]
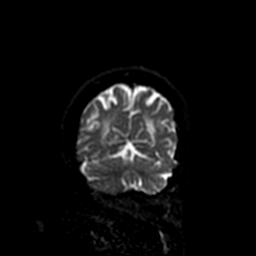
[im 23/58]
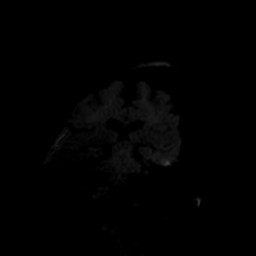
[im 35/58]
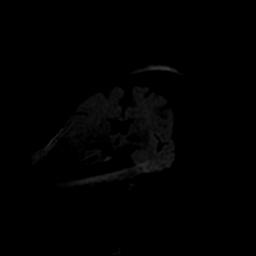
[im 46/58]
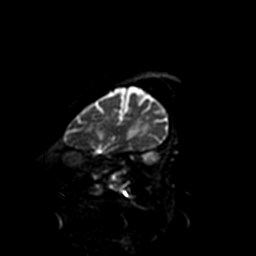
[im 58/58]
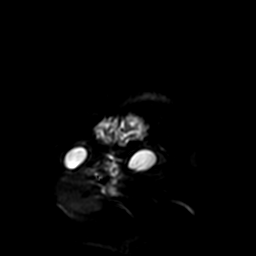

[Series 6: (id) mt fs · axial · 1.4mm · 0.39mm/px · z∈[-85,-46]mm · 4 of 156 slices shown]
[im 10/156]
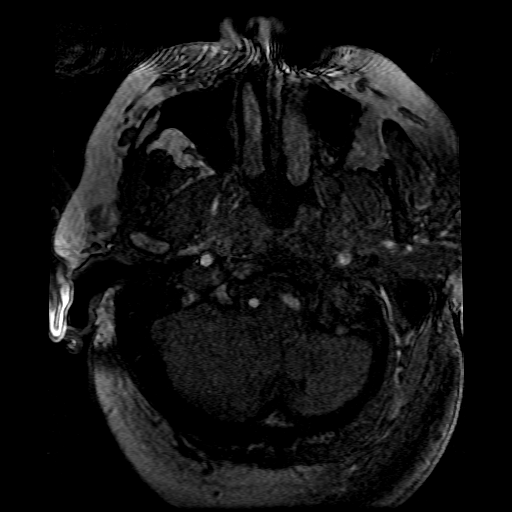
[im 30/156]
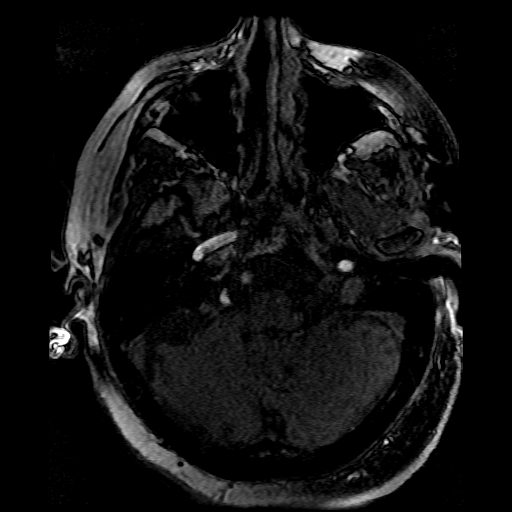
[im 49/156]
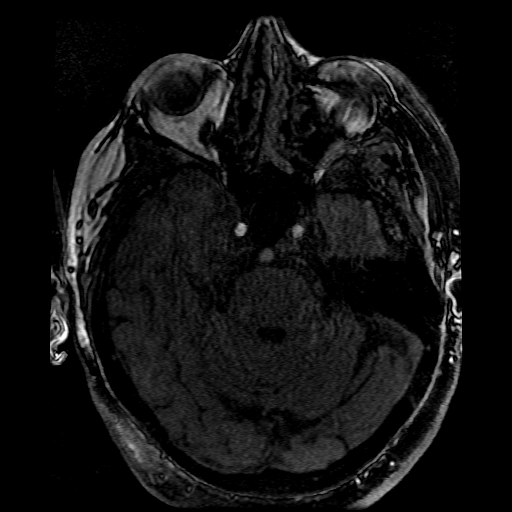
[im 68/156]
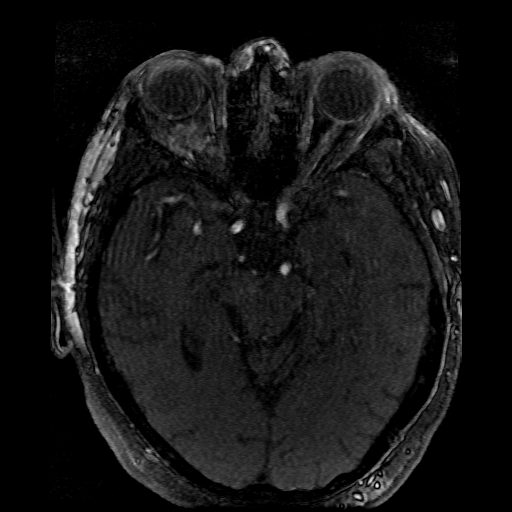

[Series 7: T2 · axial · 5.0mm · 0.43mm/px · z∈[-111,+44]mm · 3 of 26 slices shown]
[im 1/26]
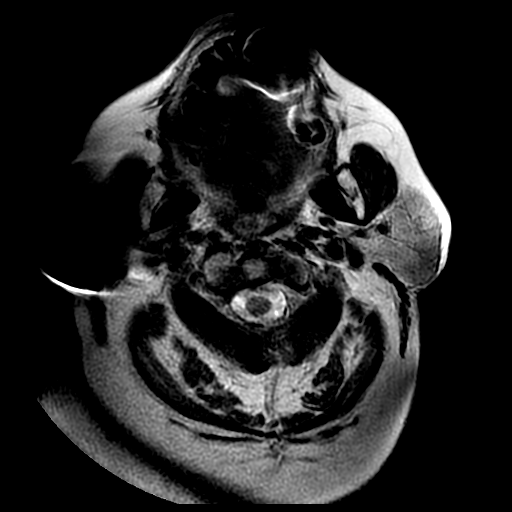
[im 13/26]
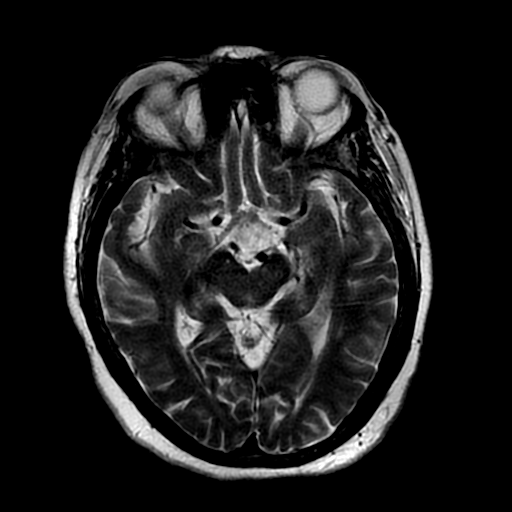
[im 26/26]
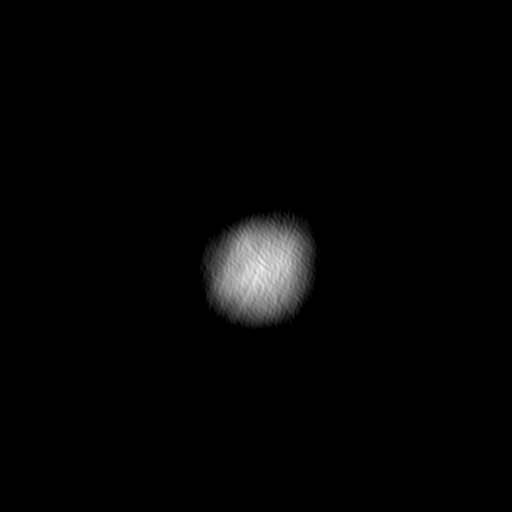

[Series 300: DWI · axial · 3.0mm · 1.09mm/px · z∈[-86,+43]mm · 5 of 46 slices shown (3 of 4)]
[im 1/46]
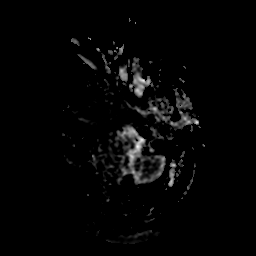
[im 12/46]
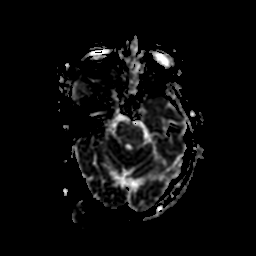
[im 23/46]
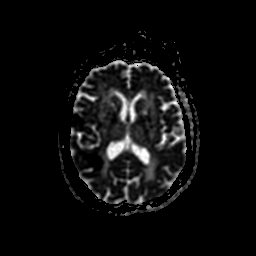
[im 34/46]
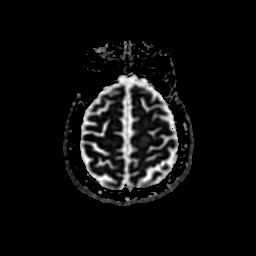
[im 46/46]
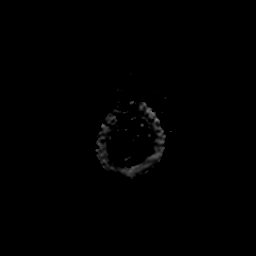

[Series 500: DWI · coronal · 5.0mm · 1.09mm/px · 3 of 29 slices shown (4 of 4)]
[im 1/29]
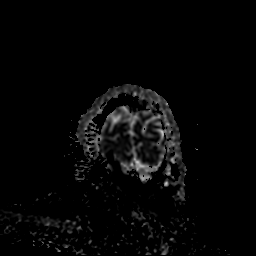
[im 15/29]
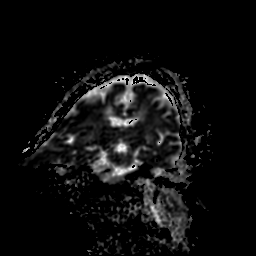
[im 29/29]
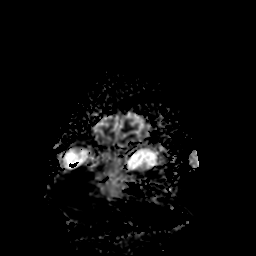

[35 of 48 positions shown; findings below may reference images not displayed]

FINDINGS: MRI HEAD FINDINGS

The examination had to be discontinued prior to completion due to
patient's inability to tolerate being in the scanner any longer and
inability to remain motionless. Patient declined further imaging and
medication for anxiolysis. Axial and coronal diffusion, sagittal T1,
and axial T2 weighted images were obtained.

Anterior right temporal lobe is obscured on diffusion-weighted
imaging due to susceptibility artifact. There are multiple patchy
areas of acute infarction in the right MCA territory involving
cortex and white matter of the posterior frontal lobe, parietal
lobe, posterior temporal lobe, and insula/external capsule. There is
associated mild cytotoxic edema without mass effect. No parenchymal
hematoma is seen.

There is no evidence of mass, midline shift, or extra-axial fluid
collection. Ventricles and sulci are within normal limits for age.
Patchy and confluent T2 hyperintensities in the subcortical and deep
cerebral white matter and in the pons are nonspecific but compatible
with rather extensive chronic small vessel ischemic disease. Chronic
ischemic changes are also noted in the thalami. Chronic lacunar
infarcts are present in the centrum semiovale bilaterally and in the
anterior limb of the left internal capsule.

Prior bilateral cataract extraction is noted. Small left sphenoid
sinus mucous retention cyst is noted. Mastoid air cells are clear.
Major intracranial vascular flow voids are preserved.

MRA HEAD FINDINGS

Images are mildly to moderately motion degraded.

The visualized distal vertebral arteries are patent and codominant.
PICA and SCA origins are patent. Basilar artery is patent and mildly
tortuous without evidence of significant stenosis. Posterior
communicating arteries are not clearly identified. P1 and proximal
stenosis. More distal PCAs are not well evaluated.

Intracranial internal carotid arteries are patent without evidence
of significant stenosis. ACAs are patent without evidence of
proximal stenosis. M1 segments are patent without stenosis. M2
trunks are patent bilaterally. Evaluation of MCA branch vessels is
limited due to degraded image quality, with suggestion of a slightly
diminished number of more distal MCA branches on the left compared
to the right. No intracranial aneurysm is identified.
IMPRESSION: 1. Motion degraded, incomplete examination as above.
2. Patchy right MCA territory acute infarcts. No evidence of
hemorrhage.
3. Advanced chronic small vessel ischemic disease.
4. No evidence of major intracranial arterial occlusion or
significant proximal stenosis. Suboptimal branch vessel evaluation.
